# Patient Record
Sex: Male | Born: 1975
Health system: Southern US, Community
[De-identification: ages and names within clinical notes are randomized; demographics above are authoritative.]

## PROBLEM LIST (undated history)

## (undated) DIAGNOSIS — I5023 Acute on chronic systolic (congestive) heart failure: Secondary | ICD-10-CM

## (undated) DIAGNOSIS — R7989 Other specified abnormal findings of blood chemistry: Secondary | ICD-10-CM

## (undated) DIAGNOSIS — I428 Other cardiomyopathies: Secondary | ICD-10-CM

## (undated) DIAGNOSIS — I10 Essential (primary) hypertension: Secondary | ICD-10-CM

## (undated) DIAGNOSIS — E119 Type 2 diabetes mellitus without complications: Secondary | ICD-10-CM

## (undated) DIAGNOSIS — K529 Noninfective gastroenteritis and colitis, unspecified: Secondary | ICD-10-CM

## (undated) DIAGNOSIS — R945 Abnormal results of liver function studies: Secondary | ICD-10-CM

## (undated) DIAGNOSIS — F1290 Cannabis use, unspecified, uncomplicated: Secondary | ICD-10-CM

## (undated) DIAGNOSIS — I340 Nonrheumatic mitral (valve) insufficiency: Secondary | ICD-10-CM

## (undated) DIAGNOSIS — I5022 Chronic systolic (congestive) heart failure: Secondary | ICD-10-CM

## (undated) HISTORY — DX: Other cardiomyopathies: I42.8

## (undated) HISTORY — DX: Cannabis use, unspecified, uncomplicated: F12.90

## (undated) HISTORY — DX: Noninfective gastroenteritis and colitis, unspecified: K52.9

## (undated) HISTORY — DX: Essential (primary) hypertension: I10

## (undated) HISTORY — DX: Chronic systolic (congestive) heart failure: I50.22

## (undated) HISTORY — DX: Other specified abnormal findings of blood chemistry: R79.89

## (undated) HISTORY — DX: Abnormal results of liver function studies: R94.5

---

## 2004-02-07 ENCOUNTER — Emergency Department (HOSPITAL_COMMUNITY): Admission: EM | Admit: 2004-02-07 | Discharge: 2004-02-07 | Payer: Self-pay | Admitting: Family Medicine

## 2007-10-13 ENCOUNTER — Emergency Department (HOSPITAL_COMMUNITY): Admission: EM | Admit: 2007-10-13 | Discharge: 2007-10-13 | Payer: Self-pay | Admitting: Emergency Medicine

## 2008-06-20 ENCOUNTER — Emergency Department (HOSPITAL_COMMUNITY): Admission: EM | Admit: 2008-06-20 | Discharge: 2008-06-20 | Payer: Self-pay | Admitting: Emergency Medicine

## 2009-06-25 DIAGNOSIS — I4891 Unspecified atrial fibrillation: Secondary | ICD-10-CM

## 2009-06-25 HISTORY — DX: Unspecified atrial fibrillation: I48.91

## 2009-09-09 ENCOUNTER — Emergency Department (HOSPITAL_COMMUNITY): Admission: EM | Admit: 2009-09-09 | Discharge: 2009-09-09 | Payer: Self-pay | Admitting: Emergency Medicine

## 2010-09-17 LAB — URINALYSIS, ROUTINE W REFLEX MICROSCOPIC
Bilirubin Urine: NEGATIVE
Glucose, UA: NEGATIVE mg/dL
Hgb urine dipstick: NEGATIVE
Ketones, ur: NEGATIVE mg/dL
Protein, ur: NEGATIVE mg/dL
pH: 6 (ref 5.0–8.0)

## 2010-10-12 ENCOUNTER — Emergency Department (HOSPITAL_COMMUNITY)
Admission: EM | Admit: 2010-10-12 | Discharge: 2010-10-12 | Disposition: A | Discharge status: Home / Self Care | Payer: Self-pay | Attending: Emergency Medicine | Admitting: Emergency Medicine

## 2010-10-12 ENCOUNTER — Emergency Department (HOSPITAL_COMMUNITY): Payer: Self-pay

## 2010-10-12 DIAGNOSIS — R197 Diarrhea, unspecified: Secondary | ICD-10-CM | POA: Insufficient documentation

## 2010-10-12 DIAGNOSIS — R112 Nausea with vomiting, unspecified: Secondary | ICD-10-CM | POA: Insufficient documentation

## 2010-10-12 DIAGNOSIS — R109 Unspecified abdominal pain: Secondary | ICD-10-CM | POA: Insufficient documentation

## 2010-10-12 DIAGNOSIS — K529 Noninfective gastroenteritis and colitis, unspecified: Secondary | ICD-10-CM

## 2010-10-12 DIAGNOSIS — R141 Gas pain: Secondary | ICD-10-CM | POA: Insufficient documentation

## 2010-10-12 DIAGNOSIS — R142 Eructation: Secondary | ICD-10-CM | POA: Insufficient documentation

## 2010-10-12 DIAGNOSIS — R000 Tachycardia, unspecified: Secondary | ICD-10-CM | POA: Insufficient documentation

## 2010-10-12 DIAGNOSIS — K5289 Other specified noninfective gastroenteritis and colitis: Secondary | ICD-10-CM | POA: Insufficient documentation

## 2010-10-12 DIAGNOSIS — R143 Flatulence: Secondary | ICD-10-CM | POA: Insufficient documentation

## 2010-10-12 DIAGNOSIS — I10 Essential (primary) hypertension: Secondary | ICD-10-CM | POA: Insufficient documentation

## 2010-10-12 HISTORY — DX: Noninfective gastroenteritis and colitis, unspecified: K52.9

## 2010-10-12 LAB — COMPREHENSIVE METABOLIC PANEL
Alkaline Phosphatase: 42 U/L (ref 39–117)
BUN: 8 mg/dL (ref 6–23)
Creatinine, Ser: 1.21 mg/dL (ref 0.4–1.5)
Glucose, Bld: 117 mg/dL — ABNORMAL HIGH (ref 70–99)
Potassium: 4 mEq/L (ref 3.5–5.1)
Total Protein: 7.4 g/dL (ref 6.0–8.3)

## 2010-10-12 LAB — URINALYSIS, ROUTINE W REFLEX MICROSCOPIC
Ketones, ur: 15 mg/dL — AB
Nitrite: NEGATIVE
Protein, ur: NEGATIVE mg/dL
pH: 7.5 (ref 5.0–8.0)

## 2010-10-12 LAB — DIFFERENTIAL
Eosinophils Absolute: 0.1 10*3/uL (ref 0.0–0.7)
Eosinophils Relative: 1 % (ref 0–5)
Lymphocytes Relative: 30 % (ref 12–46)
Lymphs Abs: 2 10*3/uL (ref 0.7–4.0)
Monocytes Absolute: 0.6 10*3/uL (ref 0.1–1.0)
Monocytes Relative: 8 % (ref 3–12)

## 2010-10-12 LAB — CBC
HCT: 43.2 % (ref 39.0–52.0)
MCH: 29.2 pg (ref 26.0–34.0)
MCHC: 33.6 g/dL (ref 30.0–36.0)
MCV: 86.9 fL (ref 78.0–100.0)
Platelets: 213 10*3/uL (ref 150–400)
RDW: 13.2 % (ref 11.5–15.5)
WBC: 6.9 10*3/uL (ref 4.0–10.5)

## 2010-10-12 LAB — LIPASE, BLOOD: Lipase: 19 U/L (ref 11–59)

## 2010-10-12 MED ORDER — IOHEXOL 300 MG/ML  SOLN: 100.0000 mL | Freq: Once | INTRAMUSCULAR | Status: DC | PRN

## 2010-10-20 DIAGNOSIS — I428 Other cardiomyopathies: Secondary | ICD-10-CM

## 2010-10-20 HISTORY — DX: Other cardiomyopathies: I42.8

## 2010-10-31 ENCOUNTER — Emergency Department (HOSPITAL_COMMUNITY)
Admission: EM | Admit: 2010-10-31 | Discharge: 2010-11-01 | Disposition: A | Discharge status: Home / Self Care | Payer: Medicaid Other | Attending: Emergency Medicine | Admitting: Emergency Medicine

## 2010-10-31 DIAGNOSIS — R197 Diarrhea, unspecified: Secondary | ICD-10-CM | POA: Insufficient documentation

## 2010-10-31 DIAGNOSIS — R109 Unspecified abdominal pain: Secondary | ICD-10-CM | POA: Insufficient documentation

## 2010-10-31 DIAGNOSIS — R112 Nausea with vomiting, unspecified: Secondary | ICD-10-CM | POA: Insufficient documentation

## 2010-10-31 DIAGNOSIS — K5289 Other specified noninfective gastroenteritis and colitis: Secondary | ICD-10-CM | POA: Insufficient documentation

## 2010-11-01 ENCOUNTER — Emergency Department (HOSPITAL_COMMUNITY): Payer: Medicaid Other

## 2010-11-01 LAB — URINALYSIS, ROUTINE W REFLEX MICROSCOPIC
Bilirubin Urine: NEGATIVE
Glucose, UA: NEGATIVE mg/dL
Hgb urine dipstick: NEGATIVE
Ketones, ur: 15 mg/dL — AB
Protein, ur: NEGATIVE mg/dL

## 2010-11-01 LAB — DIFFERENTIAL
Basophils Absolute: 0 10*3/uL (ref 0.0–0.1)
Eosinophils Absolute: 0.1 10*3/uL (ref 0.0–0.7)
Eosinophils Relative: 2 % (ref 0–5)
Lymphocytes Relative: 40 % (ref 12–46)
Neutrophils Relative %: 48 % (ref 43–77)

## 2010-11-01 LAB — COMPREHENSIVE METABOLIC PANEL
ALT: 34 U/L (ref 0–53)
AST: 30 U/L (ref 0–37)
Albumin: 3.8 g/dL (ref 3.5–5.2)
Alkaline Phosphatase: 38 U/L — ABNORMAL LOW (ref 39–117)
Potassium: 3.7 mEq/L (ref 3.5–5.1)
Sodium: 139 mEq/L (ref 135–145)
Total Protein: 6.9 g/dL (ref 6.0–8.3)

## 2010-11-01 LAB — CBC
Platelets: 202 10*3/uL (ref 150–400)
RDW: 13.3 % (ref 11.5–15.5)
WBC: 6.8 10*3/uL (ref 4.0–10.5)

## 2010-11-01 MED ORDER — IOHEXOL 300 MG/ML  SOLN
100.0000 mL | Freq: Once | INTRAMUSCULAR | Status: AC | PRN
  Administered 2010-11-01: 100 mL via INTRAVENOUS

## 2010-11-09 ENCOUNTER — Telehealth: Payer: Self-pay | Admitting: Internal Medicine

## 2010-11-09 NOTE — Telephone Encounter (Signed)
Left message for pt to call back  °

## 2010-11-09 NOTE — Telephone Encounter (Signed)
Pt has been seen in the ER several times for colitis. Pt needs an appt with a GI doc and was instructed to call us. Appt made for pt to see Willette Cluster NP 11/16/10@8 :30am. Pt aware of appt date and time.

## 2010-11-16 ENCOUNTER — Emergency Department (HOSPITAL_COMMUNITY): Payer: Medicaid Other

## 2010-11-16 ENCOUNTER — Encounter (HOSPITAL_COMMUNITY): Payer: Self-pay | Admitting: Radiology

## 2010-11-16 ENCOUNTER — Emergency Department (HOSPITAL_COMMUNITY)
Admission: EM | Admit: 2010-11-16 | Discharge: 2010-11-16 | Disposition: A | Discharge status: Home / Self Care | Payer: Medicaid Other | Attending: Emergency Medicine | Admitting: Emergency Medicine

## 2010-11-16 ENCOUNTER — Encounter: Payer: Self-pay | Admitting: Nurse Practitioner

## 2010-11-16 ENCOUNTER — Ambulatory Visit (INDEPENDENT_AMBULATORY_CARE_PROVIDER_SITE_OTHER): Payer: Self-pay | Admitting: Nurse Practitioner

## 2010-11-16 DIAGNOSIS — R0609 Other forms of dyspnea: Secondary | ICD-10-CM | POA: Insufficient documentation

## 2010-11-16 DIAGNOSIS — R0602 Shortness of breath: Secondary | ICD-10-CM | POA: Insufficient documentation

## 2010-11-16 DIAGNOSIS — R112 Nausea with vomiting, unspecified: Secondary | ICD-10-CM

## 2010-11-16 DIAGNOSIS — R142 Eructation: Secondary | ICD-10-CM | POA: Insufficient documentation

## 2010-11-16 DIAGNOSIS — R109 Unspecified abdominal pain: Secondary | ICD-10-CM | POA: Insufficient documentation

## 2010-11-16 DIAGNOSIS — R197 Diarrhea, unspecified: Secondary | ICD-10-CM | POA: Insufficient documentation

## 2010-11-16 DIAGNOSIS — R0989 Other specified symptoms and signs involving the circulatory and respiratory systems: Secondary | ICD-10-CM | POA: Insufficient documentation

## 2010-11-16 DIAGNOSIS — R634 Abnormal weight loss: Secondary | ICD-10-CM

## 2010-11-16 DIAGNOSIS — R000 Tachycardia, unspecified: Secondary | ICD-10-CM

## 2010-11-16 DIAGNOSIS — R141 Gas pain: Secondary | ICD-10-CM | POA: Insufficient documentation

## 2010-11-16 DIAGNOSIS — R935 Abnormal findings on diagnostic imaging of other abdominal regions, including retroperitoneum: Secondary | ICD-10-CM

## 2010-11-16 DIAGNOSIS — K5289 Other specified noninfective gastroenteritis and colitis: Secondary | ICD-10-CM | POA: Insufficient documentation

## 2010-11-16 LAB — LIPASE, BLOOD: Lipase: 13 U/L (ref 11–59)

## 2010-11-16 LAB — CBC
MCH: 30.2 pg (ref 26.0–34.0)
MCV: 87 fL (ref 78.0–100.0)
Platelets: 199 10*3/uL (ref 150–400)
RBC: 4.84 MIL/uL (ref 4.22–5.81)

## 2010-11-16 LAB — COMPREHENSIVE METABOLIC PANEL
Alkaline Phosphatase: 39 U/L (ref 39–117)
BUN: 14 mg/dL (ref 6–23)
CO2: 23 mEq/L (ref 19–32)
Chloride: 101 mEq/L (ref 96–112)
GFR calc non Af Amer: >60 mL/min (ref >60)
Glucose, Bld: 186 mg/dL — ABNORMAL HIGH (ref 70–99)
Potassium: 4.2 mEq/L (ref 3.5–5.1)
Total Bilirubin: 0.9 mg/dL (ref 0.3–1.2)

## 2010-11-16 LAB — URINALYSIS, ROUTINE W REFLEX MICROSCOPIC
Bilirubin Urine: NEGATIVE
Glucose, UA: NEGATIVE mg/dL
Ketones, ur: NEGATIVE mg/dL
Nitrite: NEGATIVE
pH: 5.5 (ref 5.0–8.0)

## 2010-11-16 LAB — DIFFERENTIAL
Eosinophils Absolute: 0 10*3/uL (ref 0.0–0.7)
Lymphs Abs: 2.5 10*3/uL (ref 0.7–4.0)
Monocytes Relative: 9 % (ref 3–12)
Neutrophils Relative %: 61 % (ref 43–77)

## 2010-11-16 MED ORDER — PEG-KCL-NACL-NASULF-NA ASC-C 100 G PO SOLR: 1.0000 | Freq: Once | ORAL | Status: AC

## 2010-11-16 MED ORDER — IOHEXOL 300 MG/ML  SOLN
100.0000 mL | Freq: Once | INTRAMUSCULAR | Status: AC | PRN
  Administered 2010-11-16: 100 mL via INTRAVENOUS

## 2010-11-16 NOTE — Progress Notes (Signed)
11/16/2010 Tyrone Walker [id number] [birth year]   HISTORY OF PRESENT ILLNESS: Tyrone Walker is a 35 year old black male who presents for evaluation of a one-month history of abdominal pain, diarrhea, nausea and vomiting. In mid April the patient went to the emergency department with these symptoms and CBC, lipase and comprehensive metabolic profile were normal. CT scan of the abdomen and pelvis with contrast showed some mild edema of the right colon. Patient was given a seven-day supply of Cipro Flagyl and Percocet. In the ER  patient was hypertensive and tachycardic and was advised to call his primary care provider in the next one to 2 weeks for further evaluation. Blood pressure in the emergency department was 170/118.  Patient completed the antibiotics without any improvement in symptoms. He returned to the emergency department on May 8th for reevaluation.  Again, patient's labs were normal. Urinalysis was unremarkable. Repeat CT scan of the abdomen and pelvis with contrast revealed small bilateral pleural effusions, fatty liver and stable appearance of the mild right-sided colonic edema. In the emergency department the patient was given IV fluids. pain medications and antiemetics. He was discharged home with Percocet, Levsin and Prednisone 50 mg twice a day for 5 days. Patient has completed his prednisone and symptoms have improved but not considerably. He still has some residual abdominal pain, mainly right mid abdomen. His diarrhea is mainly nocturnal. Stools are nonbloody. No documented fevers. Patient has had a 20 pound weight loss since his symptoms began one month ago. Prior to one month ago the patient had no gastrointestinal problems whatsoever.  Patient mentions that he is short of breath and has been for about a month now. He gets winded after taking a short flight of stairs. Prior to a month ago patient was working out at [employer] and in good health. Shortness of breath is particularly bad at night,  patient unable to lay flat without waking up short of breath. He has no chest pain. At present his breathing is unlabored. He does cough up some mucus from time to time.    Past Medical History  Diagnosis Date  . Ulcerative colitis    History reviewed. No pertinent past surgical history.  reports that he has never smoked. He has never used smokeless tobacco. He reports that he does not drink alcohol or use illicit drugs. family history includes Colon polyps in an unspecified family member and Diabetes in his mother. No Known Allergies    Outpatient Encounter Prescriptions as of 11/16/2010  Medication Sig Dispense Refill  . oxyCODONE-acetaminophen (PERCOCET) 10-325 MG per tablet Take 1 tablet by mouth every 6 (six) hours as needed.        . hyoscyamine (LEVSIN, ANASPAZ) 0.125 MG tablet Take 0.125 mg by mouth every 6 (six) hours as needed.        . metroNIDAZOLE (FLAGYL) 500 MG tablet Take 500 mg by mouth 3 (three) times daily.        . peg 3350 powder (MOVIPREP) 100 G SOLR Take 1 kit (100 g total) by mouth once.  1 kit  0  . predniSONE (DELTASONE) 50 MG tablet Take 50 mg by mouth 2 (two) times daily.           REVIEW OF SYSTEMS  : All other systems reviewed and negative except where noted in the History of Present Illness.   PHYSICAL EXAM: General: Well developed black male in no acute distress Head: Normocephalic and atraumatic Eyes:  sclerae anicteric,conjunctive pink. Ears: Normal auditory acuity Mouth: No  deformity or lesions Neck: Supple, no masses.  Lungs: Clear throughout to auscultation Heart:Apical pulse around 120 ; radial around 60 Abdomen: Soft, non distended, nontender. No masses or hepatomegaly noted. Normal Bowel sounds Rectal: Soft light brown-colored heme-negative stool. Musculoskeletal: Symmetrical with no gross deformities  Skin: No lesions on visible extremities Extremities: No edema or deformities noted Neurological: Alert oriented x 4, grossly  nonfocal Cervical Nodes:  No significant cervical adenopathy Psychological:  Alert and cooperative. Normal mood and affect  ASSESSMENT AND PLAN;

## 2010-11-16 NOTE — Patient Instructions (Signed)
We scheduled you for an appointment with Dr. Charlton Haws at [facility] [address]. [place] building. We scheduled you for a Colonoscopy with Dr. Melvia Heaps. Directions and brochure provided. We faxed the prescription for the colonoscopy prep to [facility].

## 2010-11-17 ENCOUNTER — Encounter: Payer: Self-pay | Admitting: Nurse Practitioner

## 2010-11-17 DIAGNOSIS — R0602 Shortness of breath: Secondary | ICD-10-CM | POA: Insufficient documentation

## 2010-11-17 DIAGNOSIS — R112 Nausea with vomiting, unspecified: Secondary | ICD-10-CM | POA: Insufficient documentation

## 2010-11-17 DIAGNOSIS — R634 Abnormal weight loss: Secondary | ICD-10-CM | POA: Insufficient documentation

## 2010-11-17 DIAGNOSIS — R197 Diarrhea, unspecified: Secondary | ICD-10-CM | POA: Insufficient documentation

## 2010-11-17 DIAGNOSIS — R000 Tachycardia, unspecified: Secondary | ICD-10-CM | POA: Insufficient documentation

## 2010-11-17 DIAGNOSIS — R935 Abnormal findings on diagnostic imaging of other abdominal regions, including retroperitoneum: Secondary | ICD-10-CM | POA: Insufficient documentation

## 2010-11-17 NOTE — Assessment & Plan Note (Addendum)
EKG read by Dr. Arlyce Dice - sinus tachycardia (HR 116) with fusion complexes, biatrial enlargement.

## 2010-11-17 NOTE — Assessment & Plan Note (Signed)
20 pound weight loss since GI symptoms began a month ago.

## 2010-11-17 NOTE — Assessment & Plan Note (Addendum)
Over one month history of nausea, vomiting, nonbloody diarrhea and abdominal pain, predominately right sided. Symptoms refractory to course of antbiotics. Moderate improvement after short course of steroids. CTscan reveals mild right colon edema. Rule out infectious colitis, IBD. For further evaluation the patient will be scheduled for colonoscopy. The risks, benefits, and alternatives to colonoscopy with possible biopsy and possible polypectomy were discussed with the patient and he consents to proceed. Cardiac evaluation prior to procedure.

## 2010-11-18 ENCOUNTER — Inpatient Hospital Stay (HOSPITAL_COMMUNITY)
Admission: EM | Admit: 2010-11-18 | Discharge: 2010-11-26 | DRG: 287 | Disposition: A | Discharge status: Home Health | Payer: Medicaid Other | Attending: Family Medicine | Admitting: Family Medicine

## 2010-11-18 ENCOUNTER — Encounter: Payer: Self-pay | Admitting: Family Medicine

## 2010-11-18 ENCOUNTER — Emergency Department (HOSPITAL_COMMUNITY): Payer: Medicaid Other

## 2010-11-18 DIAGNOSIS — I5023 Acute on chronic systolic (congestive) heart failure: Principal | ICD-10-CM | POA: Diagnosis present

## 2010-11-18 DIAGNOSIS — I428 Other cardiomyopathies: Secondary | ICD-10-CM | POA: Diagnosis present

## 2010-11-18 DIAGNOSIS — I129 Hypertensive chronic kidney disease with stage 1 through stage 4 chronic kidney disease, or unspecified chronic kidney disease: Secondary | ICD-10-CM | POA: Diagnosis present

## 2010-11-18 DIAGNOSIS — E876 Hypokalemia: Secondary | ICD-10-CM | POA: Diagnosis present

## 2010-11-18 DIAGNOSIS — R197 Diarrhea, unspecified: Secondary | ICD-10-CM | POA: Diagnosis present

## 2010-11-18 DIAGNOSIS — I509 Heart failure, unspecified: Secondary | ICD-10-CM | POA: Diagnosis present

## 2010-11-18 DIAGNOSIS — R7989 Other specified abnormal findings of blood chemistry: Secondary | ICD-10-CM | POA: Diagnosis present

## 2010-11-18 DIAGNOSIS — I279 Pulmonary heart disease, unspecified: Secondary | ICD-10-CM | POA: Diagnosis present

## 2010-11-18 DIAGNOSIS — N189 Chronic kidney disease, unspecified: Secondary | ICD-10-CM | POA: Diagnosis present

## 2010-11-18 LAB — DIFFERENTIAL
Basophils Absolute: 0 10*3/uL (ref 0.0–0.1)
Basophils Relative: 0 % (ref 0–1)
Eosinophils Relative: 1 % (ref 0–5)
Monocytes Absolute: 0.8 10*3/uL (ref 0.1–1.0)
Monocytes Relative: 11 % (ref 3–12)

## 2010-11-18 LAB — CBC
HCT: 41.3 % (ref 39.0–52.0)
MCH: 29.9 pg (ref 26.0–34.0)
MCHC: 34.4 g/dL (ref 30.0–36.0)
RDW: 13.6 % (ref 11.5–15.5)

## 2010-11-18 LAB — COMPREHENSIVE METABOLIC PANEL
AST: 51 U/L — ABNORMAL HIGH (ref 0–37)
Albumin: 3.4 g/dL — ABNORMAL LOW (ref 3.5–5.2)
Alkaline Phosphatase: 36 U/L — ABNORMAL LOW (ref 39–117)
CO2: 22 mEq/L (ref 19–32)
Chloride: 102 mEq/L (ref 96–112)
Creatinine, Ser: 1.1 mg/dL (ref 0.4–1.5)
GFR calc Af Amer: >60 mL/min (ref >60)
GFR calc non Af Amer: >60 mL/min (ref >60)
Potassium: 3.9 mEq/L (ref 3.5–5.1)
Total Bilirubin: 0.6 mg/dL (ref 0.3–1.2)

## 2010-11-18 LAB — CK TOTAL AND CKMB (NOT AT ARMC)
CK, MB: 3.3 ng/mL (ref 0.3–4.0)
Total CK: 126 U/L (ref 7–232)

## 2010-11-18 NOTE — Assessment & Plan Note (Signed)
One month history of SOB with minimal exertion. Chest x-ray 10/12/2010 shows mild congestive changes and heart along with slight interstitial edema. Lungs are clear on examination. Will arrange for cardiac evaluation.

## 2010-11-18 NOTE — Progress Notes (Unsigned)
Family Medicine Teaching [facility] Admission History and Physical  Patient name: Tyrone Walker Medical record number: [id number] Date of birth: [birth year] Age: 35 y.o. Gender: male  Primary Care Provider: [name], MD  Chief Complaint: *** History of Present Illness: Toshiro Walker is a 35 y.o. year old male presenting with ***  Patient Active Problem List  Diagnoses  . Nonspecific (abnormal) findings on radiological and other examination of abdominal area, including retroperitoneum  . Diarrhea  . Nausea with vomiting  . Loss of weight  . Shortness of breath  . Tachycardia   Past Medical History: No past medical history on file.  Past Surgical History: No past surgical history on file.  Social History: History   Social History  . Marital Status: Single    Spouse Name: N/A    Number of Children: 2  . Years of Education: N/A   Social History Main Topics  . Smoking status: Never Smoker   . Smokeless tobacco: Never Used  . Alcohol Use: No  . Drug Use: No  . Sexually Active: Not on file   Other Topics Concern  . Not on file   Social History Narrative  . No narrative on file    Family History: Family History  Problem Relation Age of Onset  . Diabetes Mother     Grandmother  . Colon polyps      Grandfather    Allergies: No Known Allergies  Current Outpatient Prescriptions  Medication Sig Dispense Refill  . hyoscyamine (LEVSIN, ANASPAZ) 0.125 MG tablet Take 0.125 mg by mouth every 6 (six) hours as needed.        . metroNIDAZOLE (FLAGYL) 500 MG tablet Take 500 mg by mouth 3 (three) times daily.        Marland Kitchen oxyCODONE-acetaminophen (PERCOCET) 10-325 MG per tablet Take 1 tablet by mouth every 6 (six) hours as needed.        . predniSONE (DELTASONE) 50 MG tablet Take 50 mg by mouth 2 (two) times daily.         Review Of Systems: Per HPI with the following additions: *** Otherwise 12 point review of systems was performed and was  unremarkable.  Physical Exam: Pulse: ***  Blood Pressure: ***/*** RR: ***   O2: *** on *** Temp: ***  General: {Exam; general:16600} HEENT: {exam; heent:31974} Heart: {EXAM; BJYNW:29562} Lungs: {pe lungs:310571::"clear to auscultation","no wheezes or rales","unlabored breathing"} Abdomen: {Exam; abdomen:5259::"abdomen is soft without significant tenderness, masses, organomegaly or guarding"} Extremities: {Exam; extremity:5109} Skin:{Exam; ZHYQ:65784} Neurology: {exam; neuro:5902::"normal without focal findings","mental status, speech normal, alert and oriented x3","PERLA","reflexes normal and symmetric"}  Labs and Imaging: Lab Results  Component Value Date/Time   NA 137 11/18/2010  8:40 PM   K 3.9 11/18/2010  8:40 PM   CL 102 11/18/2010  8:40 PM   CO2 22 11/18/2010  8:40 PM   BUN 14 11/18/2010  8:40 PM   CREATININE 1.10 11/18/2010  8:40 PM   GLUCOSE 100* 11/18/2010  8:40 PM   Lab Results  Component Value Date   WBC 7.1 11/18/2010   HGB 14.2 11/18/2010   HCT 41.3 11/18/2010   MCV 86.9 11/18/2010   PLT 186 11/18/2010   ***   Assessment and Plan: Tyrone Walker is a 35 y.o. year old male presenting with *** 1. *** 2. FEN/GI: *** 3. Prophylaxis: *** 4. Disposition: ***

## 2010-11-19 ENCOUNTER — Inpatient Hospital Stay (HOSPITAL_COMMUNITY): Payer: Medicaid Other

## 2010-11-19 DIAGNOSIS — I5023 Acute on chronic systolic (congestive) heart failure: Secondary | ICD-10-CM

## 2010-11-19 DIAGNOSIS — R0602 Shortness of breath: Secondary | ICD-10-CM

## 2010-11-19 DIAGNOSIS — K219 Gastro-esophageal reflux disease without esophagitis: Secondary | ICD-10-CM

## 2010-11-19 LAB — CBC
MCH: 29.3 pg (ref 26.0–34.0)
MCHC: 33.3 g/dL (ref 30.0–36.0)
Platelets: 177 10*3/uL (ref 150–400)
RDW: 13.8 % (ref 11.5–15.5)

## 2010-11-19 LAB — RAPID URINE DRUG SCREEN, HOSP PERFORMED
Amphetamines: NOT DETECTED
Barbiturates: NOT DETECTED
Benzodiazepines: NOT DETECTED
Cocaine: NOT DETECTED
Opiates: NOT DETECTED
Tetrahydrocannabinol: POSITIVE — AB

## 2010-11-19 LAB — COMPREHENSIVE METABOLIC PANEL
AST: 44 U/L — ABNORMAL HIGH (ref 0–37)
CO2: 30 mEq/L (ref 19–32)
Calcium: 8.7 mg/dL (ref 8.4–10.5)
Chloride: 100 mEq/L (ref 96–112)
Creatinine, Ser: 1.34 mg/dL (ref 0.4–1.5)
GFR calc Af Amer: >60 mL/min (ref >60)
GFR calc non Af Amer: 60 mL/min — ABNORMAL LOW (ref >60)
Glucose, Bld: 88 mg/dL (ref 70–99)
Total Bilirubin: 1.1 mg/dL (ref 0.3–1.2)

## 2010-11-19 LAB — CARDIAC PANEL(CRET KIN+CKTOT+MB+TROPI)
Relative Index: INVALID (ref 0.0–2.5)
Troponin I: <0.3 ng/mL (ref <0.30)

## 2010-11-19 LAB — TSH: TSH: 0.955 u[IU]/mL (ref 0.350–4.500)

## 2010-11-19 LAB — CK TOTAL AND CKMB (NOT AT ARMC): CK, MB: 3.3 ng/mL (ref 0.3–4.0)

## 2010-11-20 DIAGNOSIS — I509 Heart failure, unspecified: Secondary | ICD-10-CM

## 2010-11-20 LAB — DIFFERENTIAL
Basophils Absolute: 0 10*3/uL (ref 0.0–0.1)
Basophils Relative: 0 % (ref 0–1)
Eosinophils Relative: 2 % (ref 0–5)
Lymphocytes Relative: 35 % (ref 12–46)
Monocytes Absolute: 0.9 10*3/uL (ref 0.1–1.0)
Neutro Abs: 3.5 10*3/uL (ref 1.7–7.7)

## 2010-11-20 LAB — CBC
Hemoglobin: 14.1 g/dL (ref 13.0–17.0)
MCHC: 33.7 g/dL (ref 30.0–36.0)
RDW: 13.7 % (ref 11.5–15.5)
WBC: 7 10*3/uL (ref 4.0–10.5)

## 2010-11-20 LAB — BASIC METABOLIC PANEL
Calcium: 8.6 mg/dL (ref 8.4–10.5)
GFR calc Af Amer: >60 mL/min (ref >60)
GFR calc non Af Amer: 55 mL/min — ABNORMAL LOW (ref >60)
Potassium: 3.4 mEq/L — ABNORMAL LOW (ref 3.5–5.1)
Sodium: 139 mEq/L (ref 135–145)

## 2010-11-20 LAB — LIPID PANEL
Cholesterol: 117 mg/dL (ref 0–200)
HDL: 28 mg/dL — ABNORMAL LOW (ref >39)
Triglycerides: 224 mg/dL — ABNORMAL HIGH (ref <150)

## 2010-11-20 LAB — IRON AND TIBC
Saturation Ratios: 12 % — ABNORMAL LOW (ref 20–55)
TIBC: 286 ug/dL (ref 215–435)

## 2010-11-20 LAB — TSH: TSH: 1.271 u[IU]/mL (ref 0.350–4.500)

## 2010-11-20 LAB — T4, FREE: Free T4: 1.26 ng/dL (ref 0.80–1.80)

## 2010-11-21 DIAGNOSIS — I428 Other cardiomyopathies: Secondary | ICD-10-CM

## 2010-11-21 HISTORY — PX: OTHER SURGICAL HISTORY: SHX169

## 2010-11-21 LAB — CBC
HCT: 41.6 % (ref 39.0–52.0)
MCH: 29.1 pg (ref 26.0–34.0)
MCV: 86.5 fL (ref 78.0–100.0)
Platelets: 191 10*3/uL (ref 150–400)
RBC: 4.81 MIL/uL (ref 4.22–5.81)
RDW: 13.7 % (ref 11.5–15.5)

## 2010-11-21 LAB — COMPREHENSIVE METABOLIC PANEL
AST: 21 U/L (ref 0–37)
Alkaline Phosphatase: 44 U/L (ref 39–117)
BUN: 19 mg/dL (ref 6–23)
CO2: 25 mEq/L (ref 19–32)
Chloride: 104 mEq/L (ref 96–112)
Creatinine, Ser: 1.31 mg/dL (ref 0.4–1.5)
GFR calc Af Amer: >60 mL/min (ref >60)
GFR calc non Af Amer: >60 mL/min (ref >60)
Potassium: 4.2 mEq/L (ref 3.5–5.1)
Sodium: 139 mEq/L (ref 135–145)
Total Bilirubin: 0.3 mg/dL (ref 0.3–1.2)

## 2010-11-21 LAB — POCT I-STAT 3, VENOUS BLOOD GAS (G3P V)
Bicarbonate: 26.6 mEq/L — ABNORMAL HIGH (ref 20.0–24.0)
O2 Saturation: 51 %
TCO2: 28 mmol/L (ref 0–100)
pCO2, Ven: 43.9 mmHg — ABNORMAL LOW (ref 45.0–50.0)
pH, Ven: 7.39 — ABNORMAL HIGH (ref 7.250–7.300)
pO2, Ven: 28 mmHg — CL (ref 30.0–45.0)

## 2010-11-21 LAB — POCT I-STAT 3, ART BLOOD GAS (G3+)
Acid-base deficit: 1 mmol/L (ref 0.0–2.0)
Bicarbonate: 22.7 mEq/L (ref 20.0–24.0)
O2 Saturation: 95 %
TCO2: 24 mmol/L (ref 0–100)
pO2, Arterial: 70 mmHg — ABNORMAL LOW (ref 80.0–100.0)

## 2010-11-21 LAB — PROTIME-INR: INR: 1.2 (ref 0.00–1.49)

## 2010-11-22 ENCOUNTER — Ambulatory Visit: Payer: Self-pay | Admitting: Physician Assistant

## 2010-11-22 ENCOUNTER — Encounter: Payer: Self-pay | Admitting: Cardiovascular Disease

## 2010-11-22 LAB — BASIC METABOLIC PANEL
BUN: 19 mg/dL (ref 6–23)
CO2: 29 mEq/L (ref 19–32)
Calcium: 8.5 mg/dL (ref 8.4–10.5)
GFR calc non Af Amer: 56 mL/min — ABNORMAL LOW (ref >60)
Glucose, Bld: 102 mg/dL — ABNORMAL HIGH (ref 70–99)

## 2010-11-22 LAB — POCT I-STAT 3, ART BLOOD GAS (G3+)
Acid-base deficit: 1 mmol/L (ref 0.0–2.0)
O2 Saturation: 86 %
pO2, Arterial: 54 mmHg — ABNORMAL LOW (ref 80.0–100.0)

## 2010-11-22 NOTE — Progress Notes (Signed)
Check official reading of EKG.  I thought it was a bigeminy rhythm

## 2010-11-23 LAB — BASIC METABOLIC PANEL
CO2: 30 mEq/L (ref 19–32)
Calcium: 8.7 mg/dL (ref 8.4–10.5)
Chloride: 100 mEq/L (ref 96–112)
Chloride: 102 mEq/L (ref 96–112)
Creatinine, Ser: 1.38 mg/dL (ref 0.4–1.5)
GFR calc Af Amer: >60 mL/min (ref >60)
GFR calc non Af Amer: >60 mL/min (ref >60)
Glucose, Bld: 122 mg/dL — ABNORMAL HIGH (ref 70–99)
Potassium: 4.2 mEq/L (ref 3.5–5.1)
Sodium: 139 mEq/L (ref 135–145)
Sodium: 137 mEq/L (ref 135–145)

## 2010-11-23 LAB — CARBOXYHEMOGLOBIN
O2 Saturation: 52.4 %
Total hemoglobin: 13.8 g/dL (ref 13.5–18.0)

## 2010-11-23 LAB — ROCKY MTN SPOTTED FVR AB, IGM-BLOOD: RMSF IgM: 0.07 IV (ref 0.00–0.89)

## 2010-11-23 LAB — ROCKY MTN SPOTTED FVR AB, IGG-BLOOD: RMSF IgG: 0.07 IV

## 2010-11-23 NOTE — Progress Notes (Signed)
Reviewed and agree with management. Icarus Partch D. Lillias Difrancesco, M.D., FACG  

## 2010-11-23 NOTE — Consult Note (Signed)
[name], [name] NO.:  [id number]  MEDICAL RECORD NO.:  [id number]           PATIENT TYPE:  E  LOCATION:  [facility]                         FACILITY:  [facility]  PHYSICIAN:  Cassell Clement, M.D. DATE OF BIRTH:  17-Dec-1975  DATE OF CONSULTATION:  11/19/2010 DATE OF DISCHARGE:                                CONSULTATION   CARDIOLOGIST:  None.  PRIMARY MD:  [facility]  CHIEF COMPLAINT:  Shortness of breath.  HISTORY:  This pleasant 35 year old African American gentleman is seen in consultation at the request of the [facility] because of shortness of breath.  He gives a history of having had increased dyspnea with exertion for the past 2 months.  He reports that he went to the emergency room about a month ago for abdominal pain on October 12, 2010.  At that time, he was noted on radiology studies to have cardiac enlargement with prominent pulmonary vascularity and interstitial changes suggesting congestion heart failure, fluid overload with interstitial edema.  He was also told at that time of his emergency room visit that he had colitis.  This was based on some mild edema in the right colon felt possibly due to inflammatory bowel disease. Arrangements were made for him to follow-up with [facility].  The thought was that he would have a colonoscopy.  However, colonoscopy plans were put on hold when the GI Service noted the question about cardiomegaly and congestive heart failure.  He was waiting to see a cardiologist about this and an appointment was pending.  However, last evening, his dyspnea became worse and he came to the emergency room. The patient has had marked shortness of breath with minimal activity. He also gets chest discomfort when he walks and when he leans forward. The patient also gives a history of having lost 30 pounds since October 12, 2010.  He states that he has evening fevers to 101 each night and has had night  sweats.  He has also been experiencing palpitations. There is no history of known diabetes or hypertension and his cholesterol status is unknown.  The patient underwent echocardiogram earlier today which shows a dilated cardiomyopathy with ejection fraction of 20% and pulmonary hypertension with a peak PA pressure of 48.  His left atrium is mildly dilated and there was mild mitral regurgitation by echo.  MEDICATIONS:  The patient was on no medications at home.  ALLERGIES:  He denies any allergies.  SOCIAL HISTORY:  He lives with his girlfriend.  They have a newborn baby born Oct 24, 2010.  Social history also reveals that he is presently between jobs.  He is a trained Runner, broadcasting/film/video by occupation.  The patient has never smoked cigarettes.  He does not drink any alcohol.  He has never used cocaine.  He smokes occasional marijuana and his urine screen was positive for marijuana on this admission.  FAMILY HISTORY:  His mother is living but has a history of blocked coronary arteries and heart failure and his father is living at age 44 but has diabetes.  He has several siblings all of whom are healthy.  REVIEW OF SYSTEMS:  Positive for fever, chills and night sweats. Positive for peripheral edema and for presyncopal symptoms.  Negative for genitourinary symptoms.  Positive for explosive diarrhea which has actually gotten better in the past week.  No history of diabetes, thyroid disease or known hypertension.  All other systems reviewed and are negative.  PHYSICAL EXAMINATION:  VITAL SIGNS:  His blood pressure is 150/96, pulse is 102 and regular, temperature 97. GENERAL APPEARANCE:  A well-developed, well-nourished gentleman in no distress. HEAD AND NECK:  Unremarkable.  The jugular venous pressure normal. Carotids normal.  Thyroid normal. CHEST:  Clear to percussion and auscultation. HEART:  Loud S3 gallop.  There is no pericardial friction rub. ABDOMEN:  Generally tender without  palpable masses. EXTREMITIES:  No phlebitis or edema.  Pedal pulses are present. MUSCULOSKELETAL:  Unremarkable. NEUROLOGIC:  Normal. INTEGUMENT:  No skin rash.  IMAGING:  Chest x-ray shows an enlarged heart and persistent evidence of probable interstitial edema and pulmonary venous hypertension.  EKG shows sinus tachycardia, biatrial enlargement and no acute ischemic changes.  LABORATORY STUDIES:  Cardiac enzymes are negative.  His B-natriuretic peptide on admission was elevated at 1316.  His BUN is 12, creatinine 1.34.  His SGOT is elevated at 44, SGPT elevated at 93.  CBC shows normal white count of 7000, hemoglobin of 13.7, hematocrit 41.  IMPRESSION: 1. Dilated cardiomyopathy of uncertain etiology.  We will need to rule     out several potential causes.  We would consider a possible post     viral or possibly sarcoidosis.  He could also have a dilated     cardiomyopathy secondary to previously unrecognized longstanding     hypertension.  The patient denies any alcohol or cocaine use. 2. Elevated liver function tests probably secondary to congestive     heart failure and passive hepatic congestion. 3. History of fever/night sweats/weight loss for the past several     months, uncertain etiology, rule out sarcoidosis, rule out other     occult processes.  RECOMMENDATIONS:  We will treat his dilated cardiomyopathy by starting him on carvedilol and an ACE inhibitor.  We will use DVT prophylaxis with Lovenox.  We will add aspirin.  We will check lipid panel, serum iron and TIBC, serum ferritin, serum TSH and free T4 and a serum ACE level.  We will plan for right and left heart cardiac catheterization on Tuesday morning, Nov 21, 2010, when the cath lab reopens.  Many thanks for the opportunity to see this pleasant gentleman with you.          ______________________________ Cassell Clement, M.D.     TB/MEDQ  D:  11/19/2010  T:  11/19/2010  Job:  [id number]  Electronically  Signed by Cassell Clement M.D. on 11/23/2010 12:48:54 PM

## 2010-11-24 LAB — CARBOXYHEMOGLOBIN
Carboxyhemoglobin: 1.1 % (ref 0.5–1.5)
Methemoglobin: 0.5 % (ref 0.0–1.5)

## 2010-11-24 LAB — COMPREHENSIVE METABOLIC PANEL
Albumin: 3.1 g/dL — ABNORMAL LOW (ref 3.5–5.2)
Alkaline Phosphatase: 49 U/L (ref 39–117)
BUN: 15 mg/dL (ref 6–23)
Potassium: 3.9 mEq/L (ref 3.5–5.1)
Total Protein: 5.9 g/dL — ABNORMAL LOW (ref 6.0–8.3)

## 2010-11-24 LAB — CMV ANTIBODY, IGG (EIA): CMV Ab - IgG: 0.15 (ref <0.90)

## 2010-11-24 LAB — ANA: Anti Nuclear Antibody(ANA): NEGATIVE

## 2010-11-24 LAB — STOOL CULTURE

## 2010-11-24 LAB — HIV 1/2 CONFIRMATION: HIV-2 Ab: NEGATIVE

## 2010-11-25 LAB — COXSACKIE A VIRUS ANTIBODIES

## 2010-11-25 LAB — BASIC METABOLIC PANEL
BUN: 14 mg/dL (ref 6–23)
Creatinine, Ser: 1.1 mg/dL (ref 0.4–1.5)
GFR calc non Af Amer: >60 mL/min (ref >60)

## 2010-11-25 LAB — CARBOXYHEMOGLOBIN
O2 Saturation: 70.4 %
Total hemoglobin: 13.9 g/dL (ref 13.5–18.0)

## 2010-11-25 LAB — CBC
MCH: 29.4 pg (ref 26.0–34.0)
MCHC: 33.7 g/dL (ref 30.0–36.0)
MCV: 87.1 fL (ref 78.0–100.0)
Platelets: 188 10*3/uL (ref 150–400)
RDW: 13.4 % (ref 11.5–15.5)

## 2010-11-25 LAB — DIGOXIN LEVEL: Digoxin Level: 0.4 ng/mL — ABNORMAL LOW (ref 0.8–2.0)

## 2010-11-26 LAB — CBC
MCHC: 34.2 g/dL (ref 30.0–36.0)
MCV: 87.2 fL (ref 78.0–100.0)
Platelets: 219 10*3/uL (ref 150–400)
RDW: 13.5 % (ref 11.5–15.5)
WBC: 5.4 10*3/uL (ref 4.0–10.5)

## 2010-11-26 LAB — DIFFERENTIAL
Basophils Absolute: 0 10*3/uL (ref 0.0–0.1)
Eosinophils Absolute: 0.3 10*3/uL (ref 0.0–0.7)
Eosinophils Relative: 5 % (ref 0–5)
Lymphs Abs: 2.1 10*3/uL (ref 0.7–4.0)
Monocytes Absolute: 0.7 10*3/uL (ref 0.1–1.0)

## 2010-11-26 LAB — CARBOXYHEMOGLOBIN
Carboxyhemoglobin: 1 % (ref 0.5–1.5)
Methemoglobin: 0.6 % (ref 0.0–1.5)
Total hemoglobin: 14.4 g/dL (ref 13.5–18.0)

## 2010-11-26 LAB — BASIC METABOLIC PANEL
BUN: 16 mg/dL (ref 6–23)
Calcium: 9.1 mg/dL (ref 8.4–10.5)
GFR calc non Af Amer: >60 mL/min (ref >60)
Potassium: 4.1 mEq/L (ref 3.5–5.1)

## 2010-11-26 LAB — MAGNESIUM: Magnesium: 2.3 mg/dL (ref 1.5–2.5)

## 2010-11-27 ENCOUNTER — Encounter: Payer: Self-pay | Admitting: Gastroenterology

## 2010-11-27 ENCOUNTER — Institutional Professional Consult (permissible substitution): Payer: Self-pay | Admitting: Cardiovascular Disease

## 2010-11-28 ENCOUNTER — Other Ambulatory Visit: Payer: Self-pay | Admitting: Gastroenterology

## 2010-11-29 NOTE — Cardiovascular Report (Signed)
  NAMESIE, FORMISANO NO.:  [id number]  MEDICAL RECORD NO.:  [id number]           PATIENT TYPE:  I  LOCATION:  2925                         FACILITY:  [facility]  PHYSICIAN:  Lior Cartelli M. [place], M.D.  DATE OF BIRTH:  11-Jan-1976  DATE OF PROCEDURE:  11/21/2010 DATE OF DISCHARGE:                           CARDIAC CATHETERIZATION   INDICATIONS FOR PROCEDURE:  A 35 year old African American male who presents with new onset of dilated cardiomyopathy.  Etiology is unknown.  PROCEDURES:  Right and left heart catheterization, coronary and left ventricular angiography.  ACCESS:  Via the right femoral artery and vein using standard Seldinger technique.  EQUIPMENT:  5-French 4 cm right and left Judkins catheter, 5-French pigtail catheter, 5-French arterial sheath, 7-French venous sheath and 7- French balloon-tip Swan-Ganz catheter.  MEDICATIONS:  Local anesthesia 1% Xylocaine, Versed 2 mg IV.  CONTRAST:  Omnipaque 80 mL.HEMODYNAMIC DATA:  Right atrial pressure is 18/16 with a mean of 15 mmHg.  Right ventricular pressure is 47 with an EDP of 15 mmHg. Pulmonary artery pressure is 48/30 with a mean of 38 mmHg.  Pulmonary capillary wedge pressure is 35/37 with a mean of 33 mmHg.  Aortic pressure is 99/77 with a mean of 86 mmHg.  Left ventricular pressure is 95 with an EDP of 30 mmHg.  Cardiac output is 3.64 L/min by Fick with an index of 1.59.  The aortic saturation is 95% with pulmonary artery saturation of 51%.  ANGIOGRAPHIC DATA:  Left ventricular angiography performed in the RAO view demonstrates marked left ventricular enlargement with severe global hypokinesis and ejection fraction of 15-20%.  The left coronary artery arises and distributes in a dominant fashion. The left main coronary artery is normal.  The left anterior descending artery is normal.  The left circumflex coronary artery is dominant and is normal.  The right coronary artery is relatively small and  is normal.  FINAL INTERPRETATION: 1. Normal coronary anatomy. 2. Severe left ventricular dysfunction with a low cardiac output. 3. Moderate pulmonary hypertension with elevated left ventricular     filling pressures.  PLAN:  I think we need to pursue more aggressive inotropic therapy since the patient is decompensated.          ______________________________ Suesan Mohrmann M. [place], M.D.     PMJ/MEDQ  D:  11/21/2010  T:  11/22/2010  Job:  [id number]  cc:   Deniece Portela A. Sheffield Slider, M.D. Cassell Clement, M.D.  Electronically Signed by Keath Matera [place] M.D. on 11/29/2010 06:07:16 PM

## 2010-11-30 ENCOUNTER — Encounter: Payer: Self-pay | Admitting: Physician Assistant

## 2010-11-30 ENCOUNTER — Ambulatory Visit (INDEPENDENT_AMBULATORY_CARE_PROVIDER_SITE_OTHER): Payer: Medicaid Other | Admitting: Physician Assistant

## 2010-11-30 VITALS — BP 115/65 | HR 101 | Resp 16 | Ht 73.0 in | Wt 231.0 lb

## 2010-11-30 DIAGNOSIS — I428 Other cardiomyopathies: Secondary | ICD-10-CM

## 2010-11-30 DIAGNOSIS — I5022 Chronic systolic (congestive) heart failure: Secondary | ICD-10-CM | POA: Insufficient documentation

## 2010-11-30 LAB — BASIC METABOLIC PANEL
BUN: 18 mg/dL (ref 6–23)
CO2: 26 mEq/L (ref 19–32)
Chloride: 96 mEq/L (ref 96–112)
Glucose, Bld: 222 mg/dL — ABNORMAL HIGH (ref 70–99)
Potassium: 4 mEq/L (ref 3.5–5.1)

## 2010-11-30 NOTE — Assessment & Plan Note (Signed)
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  Will plan on relook echo in several months to reassess LVF.  If EF not recovered, refer to EP for consideration of ICD.

## 2010-11-30 NOTE — Assessment & Plan Note (Addendum)
Doing well.  Volume is stable.  Good medical regimen.  Getting lightheaded.  Will decrease Lasix to QOD.  Check BMET and digoxin level today.  Follow up with me in 2 weeks and Dr. Gala Romney in 4-6 weeks.  If feels better at follow up, increase carvedilol for better heart rate.

## 2010-11-30 NOTE — Patient Instructions (Signed)
Your physician recommends that you schedule a follow-up appointment in: 12/18/10 @ 8:30 AM TO SEE SCOTT WEAVER, PA-C SAME DAY DR. Leory Plowman IS IN THE OFFICE  Your physician recommends that you schedule a follow-up appointment in: 4-6 WEEKS WITH DR. Gala Romney AS PER SCOTT WEAVER, PA-C.  Your physician recommends that you return for lab work in: TODAY DIGOXIN LEVEL , BMET 428.22.  Your physician has recommended you make the following change in your medication: START TAKING YOUR LASIX EVERY OTHER DAY AS PER SCOTT WEAVER, PA-C.

## 2010-11-30 NOTE — Progress Notes (Signed)
History of Present Illness: Primary Cardiologist:  Dr. Arvilla Meres  Tyrone Walker is a 35 y.o. male who was admitted 5/26-6/3 with new onset systolic CHF.  He ruled out for MI.  Echo demonstrated EF 20%.  Cath demonstrated normal cors and elevated filling pressures.  He was diureses and started on IV Milrinone for low output.  He had serology done to evaluate etiology of his myopathy.  HIV was negative and ANA and RF were negative.  His CMV and RMSF were both negative.  His Coxsackie B2 titer was elevated.  He was started on beta blocker, ACE inhibitor, hydralazine, nitrates, spironolactone and digoxin.  Milrinone was weaned off and he showed dramatic improvement clinically.  He was not interested in a LifeVest.  He returns today for early follow up.  He is doing well.  Has some tightness in chest at times.  No orthopnea or PND.  Dyspnea is improved.  Describes NYHA class 2 symptoms.  No syncope or palpitations.  Taking all medications.  Not yet eager to get back to work.  Does get lightheaded with standing.  Feels his heart racing sometimes too.  Past Medical History  Diagnosis Date  . history Marijuana smoker   . Elevated LFTs     likely hepatic congestion from CHF  . Colitis 10/12/10    ER visit  . 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 cath 11/22/10: normal cors; EF 15-20%;  b. echo 11/19/10: EF 20%, mild MR, mild LAE, PASP 48; c. dx 5/12, tx with IV milrinone for low output, Coxsackie B2 titer elevated; o/w HIV, CMV, RMSF, ANA, RF, ESR, ACE all normal  . Chronic systolic heart failure     Current Outpatient Prescriptions  Medication Sig Dispense Refill  . acetaminophen (TYLENOL) 325 MG tablet Take 650 mg by mouth every 6 (six) hours as needed.        . carvedilol (COREG) 6.25 MG tablet Take 6.25 mg by mouth 2 (two) times daily with a meal.        . digoxin (LANOXIN) 0.25 MG tablet Take 250 mcg by mouth daily.        . furosemide (LASIX) 40 MG tablet Take 0.5 tablets (20 mg total)  by mouth every other day.      . hydrALAZINE (APRESOLINE) 25 MG tablet Take 25 mg by mouth. 1 and 1/2 po q8 hours       . isosorbide mononitrate (IMDUR) 30 MG 24 hr tablet Take 30 mg by mouth daily.        Marland Kitchen lisinopril (PRINIVIL,ZESTRIL) 20 MG tablet Take 20 mg by mouth 2 (two) times daily.        Marland Kitchen spironolactone (ALDACTONE) 25 MG tablet Take 25 mg by mouth daily.        Marland Kitchen DISCONTD: carvedilol (COREG) 3.125 MG tablet Take 3.125 mg by mouth 2 (two) times daily with a meal.        . DISCONTD: furosemide (LASIX) 40 MG tablet Take 20 mg by mouth daily.        Marland Kitchen DISCONTD: hyoscyamine (LEVSIN, ANASPAZ) 0.125 MG tablet Take 0.125 mg by mouth every 6 (six) hours as needed.        Marland Kitchen DISCONTD: metroNIDAZOLE (FLAGYL) 500 MG tablet Take 500 mg by mouth 3 (three) times daily.        Marland Kitchen DISCONTD: oxyCODONE-acetaminophen (PERCOCET) 10-325 MG per tablet Take 1 tablet by mouth every 6 (six) hours as needed.        Marland Kitchen DISCONTD:  predniSONE (DELTASONE) 50 MG tablet Take 50 mg by mouth 2 (two) times daily.          Allergies: No Known Allergies  Social History History  Substance Use Topics  . Smoking status: Never Smoker   . Smokeless tobacco: Never Used  . 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

## 2010-12-01 LAB — DIGOXIN LEVEL: Digoxin Level: 1.3 ng/mL (ref 0.8–2.0)

## 2010-12-04 NOTE — H&P (Signed)
Tyrone Walker, Tyrone Walker              ACCOUNT NO.:  [id number]  MEDICAL RECORD NO.:  [id number]           PATIENT TYPE:  LOCATION:                                 FACILITY:  PHYSICIAN:  Nishita Isaacks A. Tyrone Walker, M.D.    DATE OF BIRTH:  1976/06/14  DATE OF ADMISSION:  11/19/2010 DATE OF DISCHARGE:                             HISTORY & PHYSICAL   CHIEF COMPLAINT:  Epigastric pain and shortness of breath.  HISTORY OF PRESENT ILLNESS:  The patient is a 35 year old man admitted for epigastric pain x1 month and shortness of breath off and on x1 month.  Describes epigastric pain as sharp, worse at night.  Every night has acid reflux symptoms that comes up in the throat and has to vomit in order to get relief.  No dark stools.  No bloody stools.  Positive shortness of breath off and on x1 month.  Occasional lower extremity edema.  Able to "breathe better" when sitting up in upright position, seen in ER for approximately 1 week ago and was set up for cardiology consult outpatient.  The patient seen by GI consult as outpatient after April 12 CT scan showed colitis and GI would like to do colonoscopy.  We will not do this per patient report until he is cleared by cards.  REVIEW OF SYSTEMS:  No fevers no chills, no chest pain, no headache, no cough, no wheezing.  No hemoptysis, no PND, no dysuria, no rash, no polyuria.  Positive review of systems for occasional lower extremity edema.  Occasional orthopnea, positive dyspnea.  Positive nausea, positive vomiting at night with reflux symptoms.  ALLERGIES:  No known drug allergies.  MEDICATIONS:  None.  PAST MEDICAL HISTORY:  Colitis on April 12 ER visit.  PAST SURGICAL HISTORY:  None.  SOCIAL HISTORY:  Lives with newborn and son and girlfriend.  He is a Investment banker, operational.  He denies tobacco use.  He denies alcohol use.  He denies marijuana use for the past 3-4 months.  FAMILY HISTORY:  Mother, diabetes; father, sleep apnea; siblings healthy.  PHYSICAL  EXAMINATION:  VITAL SIGNS:  Temperature 99.0, pulse 110, respiratory rate 22, blood pressure 140/96, pulse ox 94% on room air. GENERAL:  No acute distress, resting quietly. HEENT:  Normocephalic, atraumatic.  Mucous membranes moist. CARDIOVASCULAR:  Regular rate and rhythm.  No murmurs, rubs, or gallops. No increased work of breathing. LUNGS:  Clear to auscultation.  Good air movement.  No crackles.  No wheezing. ABDOMEN:  Soft, positive tenderness to palpation in epigastric area.  No rebound, no guarding. EXTREMITIES:  No lower extremity edema. NEURO:  Alert and oriented x3. MUSCULOSKELETAL:  Moving all 4 extremities.  LABS AND STUDIES:  EKG showed sinus tach, no ST wave changes.  Lipase 14.  Cardiac enzymes x1.  Cardiac enzymes 2 and 3 pending.  Sodium 137, potassium 3.9, creatinine 1.10, glucose 100, AST 51, ALT 104.  BNP 1316. Portable chest x-ray showed stable moderate enlargement of cardiac mediastinal silhouette, no new finding.  White blood cells 7.1, hemoglobin 14.2, platelets 186,000.  ASSESSMENT/PLAN:  The patient is a 35 year old man with epigastric pain and shortness of  breath. 1. Epigastric pain.  History of colitis in April 2012 ED visit. Pain     was right upper quadrant at that time per patient.  The patient was     evaluated by GI.  They planned colonoscopy as outpatient, waiting     heart eval as outpatient prior to procedure secondary to recent     shortness of breath.  Findings today most consistent with reflux,     pain now more epigastric in nature consistent with reflux and that     is worse at night, a bitter taste in food, regurg at night,     vomiting in order to clear reflux material.  GI cocktail given in     the ED.  The patient on Protonix daily.  We will monitor abdominal     pain and exam. 2. Shortness of breath.  No increased work of breathing on exam.     Lungs clear on exam, pulse ox was within normal limits.  Unclear     why the patient has  shortness of breath off and on x1 month.  BNP     1316.  Reports lower extremity edema but none on exam.  Positive     questionable cardiac enlargement on chest x-ray, need to have echo     to better eval, also repeat chest x-ray this a.m. 2 view.  We will     give Lasix 40 mg IV in a.m.  The patient had one dose in the     emergency department and had improvement of shortness of breath. 3. Hypertension, question of history.  The patient denies history of     hypertension, some hypertension although noted in old ER records in     the vital signs evaluation.  We will monitor closely.  If untreated     hypertension, this could be the cause of possible cardiomegaly or     possible congestive heart failure. 4. Elevated LFTs.  AST 51, ALT 104, unsure of cause.  We will monitor     closely. 5. Fluids, electrolytes, nutrition/gastrointestinal.  Clears currently     to advance as tolerated. 6. Prophylaxis.  Heparin. 7. Disposition pending clinical improvement.     Ellin Mayhew, MD   ______________________________ Tyrone Walker. Tyrone Walker, M.D.    DC/MEDQ  D:  11/19/2010  T:  11/19/2010  Job:  [id number]  Electronically Signed by Ellin Mayhew  on 11/27/2010 10:05:40 AM Electronically Signed by Zachery Dauer M.D. on 12/04/2010 07:24:44 AM

## 2010-12-05 ENCOUNTER — Encounter: Payer: Self-pay | Admitting: Family Medicine

## 2010-12-05 ENCOUNTER — Ambulatory Visit (INDEPENDENT_AMBULATORY_CARE_PROVIDER_SITE_OTHER): Payer: Medicaid Other | Admitting: Family Medicine

## 2010-12-05 VITALS — BP 121/78 | HR 111 | Temp 98.3°F | Wt 232.0 lb

## 2010-12-05 DIAGNOSIS — H538 Other visual disturbances: Secondary | ICD-10-CM

## 2010-12-05 DIAGNOSIS — I428 Other cardiomyopathies: Secondary | ICD-10-CM

## 2010-12-05 DIAGNOSIS — G4440 Drug-induced headache, not elsewhere classified, not intractable: Secondary | ICD-10-CM

## 2010-12-05 DIAGNOSIS — R51 Headache: Secondary | ICD-10-CM

## 2010-12-05 MED ORDER — KETOROLAC TROMETHAMINE 60 MG/2ML IM SOLN
60.0000 mg | Freq: Once | INTRAMUSCULAR | Status: AC
  Administered 2010-12-05: 60 mg via INTRAMUSCULAR

## 2010-12-05 MED ORDER — ISOSORBIDE MONONITRATE ER 30 MG PO TB24: ORAL_TABLET | ORAL | Status: DC

## 2010-12-05 NOTE — Assessment & Plan Note (Signed)
Stable. Pt reports feeling better each day. Plan to continue current management, except will cut the dose of imdur in half.  Will recheck coxsackie B2 titer at the end of the month.

## 2010-12-05 NOTE — Assessment & Plan Note (Signed)
Headache most likely 2/2 to imdur.  I called [facility] today and spoke to the Cardiologist in the office who was familiar with [name] and recommended cutting the dose of imdur in half and slowly stepping the dose back up as tolerated.  Plan to continue NSAID use will give a does of IM Toradol today.

## 2010-12-05 NOTE — Assessment & Plan Note (Signed)
Acute change since new illness. Reassuring exam with no visual loss or eye pain. Plan to refer pt to Ophthalmology for dilated eye exam to assess for ischemic changes.

## 2010-12-05 NOTE — Progress Notes (Signed)
  Subjective:    Patient ID: Tyrone Walker, male    DOB: [birth year], 35 y.o.   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 He is currently on multiple medications, including a BB and imdur. He is taking all of his medications as prescribed. He is being followed closely by [facility].  Main concern is headaches, constant, frontal, sensitivity to light, not to sound, worse 1 hr post hydralazine, he describes pain as 8/10, throbbing, relieved by rest and external tension on his head.    Review of Systems  Cardiovascular: Positive for palpitations. Negative for chest pain and leg swelling.  Palpitations are intermittent brought on with exertion, he denies PND and orthopnea.      Objective:   Physical Exam  Constitutional: He is oriented to person, place, and time. He appears well-developed and well-nourished.  Eyes: Conjunctivae and EOM are normal. Pupils are equal, round, and reactive to light.  Fundoscopic exam:      The right eye shows no hemorrhage and no papilledema.       The left eye shows no hemorrhage and no papilledema.  Cardiovascular: Normal rate, regular rhythm, normal heart sounds and intact distal pulses.  Exam reveals no gallop and no friction rub.   No murmur heard.      No JVD or hepatojugular reflex. No LE edema.   Pulmonary/Chest: Effort normal and breath sounds normal. He has no wheezes. He has no rales.  Neurological: He is alert and oriented to person, place, and time. No cranial nerve deficit.          Assessment & Plan:

## 2010-12-05 NOTE — Patient Instructions (Addendum)
Tyrone Walker,  Thank you for coming in today.  For your headache, I have called the cardiologist who agree that it is most likely the imdur causing your headache. It is ok to cut the imdur in half for now and it should get better with time as your body adjust to the medication.   I have sent an Ophthalmology referral, one of my nurses will call you with the details about scheduling an appointment.  You will be due for repeat titers at the end of the month, come in for a lab draw.   Please schedule f/u for 4-6 weeks to review blood work and Ophthalmology visit.

## 2010-12-06 ENCOUNTER — Other Ambulatory Visit: Payer: Self-pay | Admitting: Family Medicine

## 2010-12-06 ENCOUNTER — Telehealth: Payer: Self-pay | Admitting: *Deleted

## 2010-12-06 MED ORDER — ONDANSETRON HCL 4 MG PO TABS: 4.0000 mg | ORAL_TABLET | Freq: Three times a day (TID) | ORAL | Status: AC | PRN

## 2010-12-06 NOTE — Telephone Encounter (Signed)
Received call from [facility]  Nurse with [facility] and she states patient continues with nausea all day headache is better. No vomiting.   Attempted to page Dr. Armen Pickup , unavailable . Consulted with Dr. Jennette Kettle and she will send in RX for nausea . [facility] [address]

## 2010-12-08 ENCOUNTER — Other Ambulatory Visit: Payer: Self-pay | Admitting: Family Medicine

## 2010-12-08 DIAGNOSIS — G4440 Drug-induced headache, not elsewhere classified, not intractable: Secondary | ICD-10-CM

## 2010-12-08 MED ORDER — ASPIRIN-ACETAMINOPHEN-CAFFEINE 250-250-65 MG PO TABS: 1.0000 | ORAL_TABLET | Freq: Four times a day (QID) | ORAL | Status: AC | PRN

## 2010-12-08 NOTE — Assessment & Plan Note (Signed)
Toradol IM relieved HA fr a few days. Now it has returned. Same quality.  Pt would prefer not to use Percocet. Plan to use Excedrin XR.

## 2010-12-08 NOTE — Telephone Encounter (Signed)
Called pt on cell.  See AP under problem list. Pt agreed to use Excedrin.

## 2010-12-12 ENCOUNTER — Encounter: Payer: Self-pay | Admitting: Cardiovascular Disease

## 2010-12-18 ENCOUNTER — Ambulatory Visit (INDEPENDENT_AMBULATORY_CARE_PROVIDER_SITE_OTHER): Payer: Medicaid Other | Admitting: Physician Assistant

## 2010-12-18 ENCOUNTER — Encounter: Payer: Self-pay | Admitting: Physician Assistant

## 2010-12-18 VITALS — BP 112/82 | HR 71 | Resp 18 | Ht 73.0 in | Wt 235.8 lb

## 2010-12-18 DIAGNOSIS — I5023 Acute on chronic systolic (congestive) heart failure: Secondary | ICD-10-CM | POA: Insufficient documentation

## 2010-12-18 DIAGNOSIS — R0989 Other specified symptoms and signs involving the circulatory and respiratory systems: Secondary | ICD-10-CM

## 2010-12-18 DIAGNOSIS — R0609 Other forms of dyspnea: Secondary | ICD-10-CM

## 2010-12-18 LAB — BASIC METABOLIC PANEL
BUN: 17 mg/dL (ref 6–23)
CO2: 28 mEq/L (ref 19–32)
Chloride: 104 mEq/L (ref 96–112)
Creatinine, Ser: 1.3 mg/dL (ref 0.4–1.5)
Glucose, Bld: 119 mg/dL — ABNORMAL HIGH (ref 70–99)

## 2010-12-18 LAB — BRAIN NATRIURETIC PEPTIDE: Pro B Natriuretic peptide (BNP): 56 pg/mL (ref 0.0–100.0)

## 2010-12-18 MED ORDER — FUROSEMIDE 40 MG PO TABS: 20.0000 mg | ORAL_TABLET | Freq: Every day | ORAL | Status: DC

## 2010-12-18 NOTE — Patient Instructions (Signed)
Your physician recommends that you schedule a follow-up appointment in: 12/25/10 @ 8:30 to see Tereso Newcomer, PA-C that same day we will repeat lab work.  Your physician recommends that you return for lab work in: today bmet/ bnp 428.23  Your physician has recommended you make the following change in your medication: INCREASE LASIX 20 MG TAKE 1/2 (HALF) TABLET EVERY DAY.

## 2010-12-18 NOTE — Progress Notes (Signed)
History of Present Illness: Primary Cardiologist:  Dr. Arvilla Meres  Tyrone Walker is a 35 y.o. male who was admitted 5/26-6/3 with new onset systolic CHF.  He ruled out for MI.  Echo demonstrated EF 20%.  Cath demonstrated normal cors and elevated filling pressures.  He was diuresed and treated on IV Milrinone for low output.  He had serology done to evaluate etiology of his myopathy.  All titers were negative except for Coxsackie B2 titer was elevated.   I saw him 2 weeks ago for follow up.  He was doing well.  Was getting lightheaded.  I changed his Lasix every other day.  Basic metabolic panel was unremarkable except for elevated sugar.  Digoxin level was normal.   He returns for follow up.   He has gradually felt like his breathing is getting more short.  He describes class III symptoms.  He feels like he is building up with fluid.  His weight is up 3 pounds since I last saw him.  He denies orthopnea or PND.  He denies chest pain or syncope.  He denies any swelling.  He feels like his stomach is swollen.  He is limiting his salt.  He is compliant with all his medications.  He is concerned about being out in the heat with the poor air quality.  Past Medical History  Diagnosis Date  . history Marijuana smoker   . Elevated LFTs     likely hepatic congestion from CHF  . Colitis 10/12/10    ER visit  . 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 cath 11/22/10: normal cors; EF 15-20%;  b. echo 11/19/10: EF 20%, mild MR, mild LAE, PASP 48; c. dx 5/12, tx with IV milrinone for low output, Coxsackie B2 titer elevated; o/w HIV, CMV, RMSF, ANA, RF, ESR, ACE all normal  . Chronic systolic heart failure     Current Outpatient Prescriptions  Medication Sig Dispense Refill  . acetaminophen (TYLENOL) 325 MG tablet Take 650 mg by mouth every 6 (six) hours as needed.       Marland Kitchen aspirin-acetaminophen-caffeine (EXCEDRIN MIGRAINE) 250-250-65 MG per tablet Take 1 tablet by mouth every 6 (six) hours as needed for  pain.  30 tablet  0  . carvedilol (COREG) 6.25 MG tablet Take 6.25 mg by mouth 2 (two) times daily with a meal.        . digoxin (LANOXIN) 0.25 MG tablet Take 250 mcg by mouth daily.        . furosemide (LASIX) 40 MG tablet Take 0.5 tablets (20 mg total) by mouth every other day.      . hydrALAZINE (APRESOLINE) 25 MG tablet Take 25 mg by mouth. 1 and 1/2 po q8 hours       . isosorbide mononitrate (IMDUR) 30 MG 24 hr tablet Take 1/2 tab by mouth daily.      Marland Kitchen lisinopril (PRINIVIL,ZESTRIL) 20 MG tablet Take 20 mg by mouth 2 (two) times daily.        Marland Kitchen spironolactone (ALDACTONE) 25 MG tablet Take 25 mg by mouth daily.          Allergies: No Known Allergies  Social History History  Substance Use Topics  . Smoking status: Never Smoker   . Smokeless tobacco: Never Used  . 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  Vital Signs: BP 112/82  Pulse 71  Resp 18  Ht 6\' 1"  (1.854 m)  Wt 235  lb 12.8 oz (106.958 kg)  BMI 31.11 kg/m2  PHYSICAL EXAM: Well nourished, well developed, in no acute distress HEENT: normal Neck: JVP minimally elevated Cardiac:  normal S1, S2; RRR; no murmur, no gallops Lungs:  clear to auscultation bilaterally, no wheezing, rhonchi or rales Abd: soft, nontender, no hepatomegaly Ext: no edema;  Skin: warm and dry Neuro:  CNs 2-12 intact, no focal abnormalities noted  EKG:  Sinus rhythm, heart rate 71, normal axis, LVH, poor R wave progression, nonspecific ST-T wave changes  ASSESSMENT AND PLAN:

## 2010-12-18 NOTE — Assessment & Plan Note (Addendum)
His weight is up 3 pounds since I last saw him.  His breathing is more short.  On exam today, his volume does not look too bad.  His JVP is up some.  I would like him to go back to Lasix every day.  He'll get a basic metabolic panel and BNP today.  I'll see him back in a week and we will repeat his basic metabolic panel at that time.  He knows to continue to weigh himself and a call us if continues to feel more and more short of breath or notices increased weight gain.  We discussed the need to stay indoors when air quality is poor in the summer months.   Heart rate better than last time.  I would like to try to increase his carvedilol next time if possible.

## 2010-12-20 ENCOUNTER — Other Ambulatory Visit: Payer: Self-pay | Admitting: Family Medicine

## 2010-12-20 NOTE — Telephone Encounter (Signed)
Refill request

## 2010-12-21 ENCOUNTER — Telehealth: Payer: Self-pay | Admitting: *Deleted

## 2010-12-21 DIAGNOSIS — I5022 Chronic systolic (congestive) heart failure: Secondary | ICD-10-CM

## 2010-12-21 NOTE — Telephone Encounter (Signed)
lmom for ptcb for lab results and lasix info

## 2010-12-22 ENCOUNTER — Telehealth: Payer: Self-pay | Admitting: *Deleted

## 2010-12-22 DIAGNOSIS — I5022 Chronic systolic (congestive) heart failure: Secondary | ICD-10-CM

## 2010-12-22 NOTE — Telephone Encounter (Signed)
Repeat bmet 428.23 pt aware of lab results

## 2010-12-25 ENCOUNTER — Other Ambulatory Visit (INDEPENDENT_AMBULATORY_CARE_PROVIDER_SITE_OTHER): Payer: Medicaid Other | Admitting: *Deleted

## 2010-12-25 ENCOUNTER — Encounter: Payer: Self-pay | Admitting: Physician Assistant

## 2010-12-25 ENCOUNTER — Ambulatory Visit (INDEPENDENT_AMBULATORY_CARE_PROVIDER_SITE_OTHER): Payer: Medicaid Other | Admitting: Physician Assistant

## 2010-12-25 VITALS — BP 136/84 | HR 60 | Resp 16 | Ht 73.0 in | Wt 238.0 lb

## 2010-12-25 DIAGNOSIS — I5023 Acute on chronic systolic (congestive) heart failure: Secondary | ICD-10-CM

## 2010-12-25 DIAGNOSIS — I5022 Chronic systolic (congestive) heart failure: Secondary | ICD-10-CM

## 2010-12-25 DIAGNOSIS — R21 Rash and other nonspecific skin eruption: Secondary | ICD-10-CM | POA: Insufficient documentation

## 2010-12-25 LAB — BASIC METABOLIC PANEL
BUN: 21 mg/dL (ref 6–23)
CO2: 29 mEq/L (ref 19–32)
Chloride: 104 mEq/L (ref 96–112)
Potassium: 4.4 mEq/L (ref 3.5–5.1)

## 2010-12-25 MED ORDER — SPIRONOLACTONE 25 MG PO TABS: 25.0000 mg | ORAL_TABLET | Freq: Every day | ORAL | Status: DC

## 2010-12-25 MED ORDER — ISOSORBIDE MONONITRATE ER 30 MG PO TB24: ORAL_TABLET | ORAL | Status: DC

## 2010-12-25 MED ORDER — CARVEDILOL 6.25 MG PO TABS: 6.2500 mg | ORAL_TABLET | Freq: Two times a day (BID) | ORAL | Status: DC

## 2010-12-25 MED ORDER — FUROSEMIDE 40 MG PO TABS: 40.0000 mg | ORAL_TABLET | Freq: Every day | ORAL | Status: DC

## 2010-12-25 NOTE — Patient Instructions (Addendum)
Your physician recommends that you schedule a follow-up appointment in: 01/31/11 @ 9:15 TO SEE DR. Gala Romney   PRESCRIPTION REFILLS HAVE BEEN SENT TO [facility] ON [address] FOR YOU TODAY

## 2010-12-25 NOTE — Assessment & Plan Note (Signed)
Probably related to new soap.  Change soap to dye free.  May use PRN anti-histamines.  Follow up with PCP if no improvement.

## 2010-12-25 NOTE — Progress Notes (Signed)
History of Present Illness: Primary Cardiologist:  Dr. Arvilla Meres  Tyrone Walker is a 35 y.o. male who was admitted 5/26-6/3 with new onset systolic CHF.  He ruled out for MI.  Echo demonstrated EF 20%.  Cath demonstrated normal cors and elevated filling pressures.  He was diuresed and treated on IV Milrinone for low output.  He had serology done to evaluate etiology of his myopathy.  All titers were negative except for Coxsackie B2 titer was elevated.   I saw him 2 weeks ago for follow up.  He was doing well.  Was getting lightheaded.  I changed his Lasix every other day.  Basic metabolic panel was unremarkable except for elevated sugar.  Digoxin level was normal.     I saw him last week and he felt more short of breath.  His weight was up.  We increased his lasix to 40 mg QD.  His BNP was normal.  However, he continued on the same dose of lasix.  Feels much better.  Feels like he is breathing better.  No orthopnea, PND or edema.  Eating a lot more.  Feels like weight gain probably related.  No chest pain.  No syncope.  Past Medical History  Diagnosis Date  . history Marijuana smoker   . Elevated LFTs     likely hepatic congestion from CHF  . Colitis 10/12/10    ER visit  . 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 cath 11/22/10: normal cors; EF 15-20%;  b. echo 11/19/10: EF 20%, mild MR, mild LAE, PASP 48; c. dx 5/12, tx with IV milrinone for low output, Coxsackie B2 titer elevated; o/w HIV, CMV, RMSF, ANA, RF, ESR, ACE all normal  . Chronic systolic heart failure     Current Outpatient Prescriptions  Medication Sig Dispense Refill  . acetaminophen (TYLENOL) 325 MG tablet Take 650 mg by mouth every 6 (six) hours as needed.       Marland Kitchen aspirin-acetaminophen-caffeine (EXCEDRIN MIGRAINE) 250-250-65 MG per tablet Take 1 tablet by mouth every 6 (six) hours as needed.        . carvedilol (COREG) 6.25 MG tablet Take 6.25 mg by mouth 2 (two) times daily with a meal.        . digoxin (LANOXIN) 0.25  MG tablet Take 250 mcg by mouth daily.        . furosemide (LASIX) 40 MG tablet Take 0.5 tablets (20 mg total) by mouth daily.  30 tablet  6  . hydrALAZINE (APRESOLINE) 25 MG tablet Take 25 mg by mouth. 1 and 1/2 po q8 hours       . isosorbide mononitrate (IMDUR) 30 MG 24 hr tablet Take 1/2 tab by mouth daily.      Marland Kitchen lisinopril (PRINIVIL,ZESTRIL) 20 MG tablet Take 20 mg by mouth 2 (two) times daily.        Marland Kitchen spironolactone (ALDACTONE) 25 MG tablet Take 25 mg by mouth daily.          Allergies: No Known Allergies  Social History History  Substance Use Topics  . Smoking status: Never Smoker   . Smokeless tobacco: Never Used  . 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  Has a rash on upper torso; pruritic.  Started using new soap recently.    Vital Signs: BP 136/84  Pulse 60  Resp 16  Ht 6\' 1"  (1.854 m)  Wt 238 lb (107.956 kg)  BMI 31.40 kg/m2  PHYSICAL EXAM: Well nourished, well developed, in no acute distress HEENT: normal Neck: no JVD Cardiac:  normal S1, S2; RRR; no murmur, no gallops Lungs:  clear to auscultation bilaterally, no wheezing, rhonchi or rales Abd: soft, nontender, no hepatomegaly Ext: no edema;  Skin: warm and dry, scattered papular rash on upper chest and shoulders with minimal excoriation; no burrows noted Neuro:  CNs 2-12 intact, no focal abnormalities noted  ASSESSMENT AND PLAN:

## 2010-12-25 NOTE — Assessment & Plan Note (Signed)
Volume stable.  Continue current meds.  Check BMET today.  If BUN and/or creatinine going up, cut back on Lasix.  Follow up with Dr. Gala Romney as scheduled, or sooner PRN.

## 2010-12-26 ENCOUNTER — Other Ambulatory Visit: Payer: Self-pay | Admitting: Family Medicine

## 2010-12-26 ENCOUNTER — Telehealth: Payer: Self-pay | Admitting: Internal Medicine

## 2010-12-26 ENCOUNTER — Telehealth: Payer: Self-pay | Admitting: *Deleted

## 2010-12-26 DIAGNOSIS — I428 Other cardiomyopathies: Secondary | ICD-10-CM

## 2010-12-26 MED ORDER — LISINOPRIL 20 MG PO TABS: 20.0000 mg | ORAL_TABLET | Freq: Two times a day (BID) | ORAL | Status: DC

## 2010-12-26 MED ORDER — ISOSORBIDE MONONITRATE ER 30 MG PO TB24: ORAL_TABLET | ORAL | Status: DC

## 2010-12-26 MED ORDER — DIGOXIN 250 MCG PO TABS: 250.0000 ug | ORAL_TABLET | Freq: Every day | ORAL | Status: DC

## 2010-12-26 NOTE — Telephone Encounter (Signed)
Pt needs a refill on all his meds and we have not responded to the [facility] contacting us pls call all his meds into [facility] at [phone] and pls make sure to add coreg 6.25mg  qd and isosorbide 30mg  tab qd because that is not on the list he is waiting because he is out of meds pls call asap

## 2010-12-26 NOTE — Telephone Encounter (Signed)
Refill request

## 2010-12-26 NOTE — Telephone Encounter (Signed)
lmom for pt rx were sent in yesterday during his ov w/SW. S/w pharm today and gave verbal orders due to pharm states they did not rcv yesterday. Danielle Rankin

## 2010-12-26 NOTE — Telephone Encounter (Signed)
Hi Heather, I sent these rx yesterday and I called the pharm today and they said they did not rcv, the pharm said someone just called and gave orders. Danielle Rankin

## 2010-12-26 NOTE — Telephone Encounter (Signed)
Pt states he is having trouble getting all meds refilled, I called [facility] and gave verbal refills on dig, imdur and lisinopril

## 2010-12-28 NOTE — Discharge Summary (Signed)
Tyrone Walker, Tyrone Walker              ACCOUNT NO.:  [id number]  MEDICAL RECORD NO.:  [id number]           PATIENT TYPE:  I  LOCATION:  2925                         FACILITY:  [facility]  PHYSICIAN:  Kagen Kunath A. Sheffield Slider, M.D.    DATE OF BIRTH:  [birth year]  DATE OF ADMISSION:  11/18/2010 DATE OF DISCHARGE:  11/26/2010                              DISCHARGE SUMMARY   PRIMARY CARE PROVIDER:  [name], MD, at [facility].  DISCHARGE DIAGNOSES: 1. Severe dilated cardiomyopathy, EF 20%. 2. Shortness of breath, resolved. 3. Epigastric pain, resolved. 4. Colitis on October 05, 2010, resolved.  DISCHARGE MEDICATIONS: 1. Tylenol 650 mg p.o. q.6 hours p.r.n. pain. 2. Coreg 6.25 mg p.o. b.i.d. with meals. 3. Digoxin 0.25 mg p.o. daily. 4. Lasix 20 mg every other day, if he experiences dizziness. 5. Hydralazine 37.5 mg p.o. q.8 hours. 6. Imdur 30 mg p.o. daily. 7. Lisinopril 20 mg p.o. b.i.d. 8. Spironolactone 25 mg p.o. daily. 9. Percocet 5/325 one tab p.o. t.i.d. p.r.n. pain.  Medications which the patient was instructed to stop include: 1. Prednisone 5 mg p.o. daily. 2. Hyoscyamine 0.125 mg p.o. b.i.d.  CONSULTS:  Bevelyn Buckles. Bensimhon, MD, [facility].  PROCEDURES: 1. Echocardiogram on Nov 19, 2010, showing severely dilated left     ventricle, normal wall thickness, estimated ejection fraction of     20%, diffuse hypokinesis. 2. Cardiac catheterization on Nov 21, 2010, showing normal coronary     anatomy, severe left ventricular dysfunction with low cardiac     output, moderate pulmonary hypertension with elevated left     ventricular filling pressures. 3. PICC line placement on Nov 22, 2010.  LABORATORY DATA:  At the time of admission, the patient's CBC was stable.  White count of 7.1, hemoglobin of 14.2, platelets of 186, this remained relatively unchanged at the time of discharge.  CMET within normal limits with the exception of a low alk phos at 36,  elevated AST/ALT at 51/104, and low albumin at 3.4.  By discharge, this had normalized, normal alk phos of 49, normal AST/ALT at 16/32.  Initial BNP of 1316, improved to 431 at the time of discharge.  Normal TSH 0.955 and 1.271, normal free T4 at 1.26.  Negative cardiac enzymes x3, lipase of 14, ACE level of 5.  UDS positive for marijuana, negative stool culture, negative C. diff, negative HIV.  Anemia panel showing low iron at 35, normal TIBC at 286, and percent saturation at 12.  Fasting lipid panel showing cholesterol 117, triglycerides at 224, HDL at 28, LDL at 44, negative ANA, negative RPR, negative CMV, negative [facility] spotted fever, negative Coxsackie A antibodies, elevated Coxsackie B2 antibody with titer of 1:8.  BRIEF HOSPITAL COURSE:  This is a 35 year old male without significant past medical history presenting with epigastric pain and shortness of breath found to have a severe dilated cardiomyopathy.  1. Dilated cardiomyopathy.  The patient on initial presentation had been    diagnosed approximately 1 month prior with a colitis as the source of    his diarrhea and abdominal pain.  On admission at this time, the  patient's BNP was elevated into the 1300.  Cardiology was involved.    Echocardiogram was done and patient was found to have 20% ejection    fraction.  Cardiac catheterization was done by Cardiology which found    clear coronary arteries, leading diagnosis for cause of his dilated    cardiomyopathy and otherwise healthy 35 year old male without a history    of hypertension or alcohol use, is most likely to be from a viral    etiology.  Coxsackie B2 titer was the threshold for elevated.  It is    recommended that this could be repeated in 4 weeks in order to see if    this titer increases further.  If the titer does increase this to 1-64,    it could be felt that this could be diagnostically considered the cause    of this dilated cardiomyopathy.   Regardless of the cause, after    patient's cardiac cath and severe ejection fraction, it was discovered    by Cardiology, he was placed on aggressive ionotropic therapy with a    milrinone drip.  The patient remained on a milrinone drip in the cardiac    ICU from Nov 21, 2010, until November 25, 2010. Over the course of the    milrinone drip, the patient's cardiac medications were adjusted by    Cardiology primarily.  The patient was started on digoxin and milrinone    drip was weaned.  The patient had been off of the milrinone drip for    greater than 24 hours at the time of discharge without any repeat in    fluid retention.  The patient remained relatively stable throughout all    of this.  He did have a PICC line placed in order to follow CVPs and    volume status by Cardiology, the PICC would no longer need on the day of    discharge and the patient had improved cardiac output based on CVPs.  The patient's medication regimen at the time of discharge include: 1. Coreg 6.25 b.i.d. 2. Hydralazine 37.5 mg t.i.d. 3. Imdur 30 mg daily. 4. Lisinopril 20 mg b.i.d. 5. Spironolactone 25 mg daily. 6. Digoxin 0.25 mg daily. 7. Lasix 20 mg daily, if patient begins to experience dizziness, he     was instructed to decrease his Lasix dose to 20 mg every other day.  ADDITIONAL PROBLEMS: 2. Elevated LFTs.  On admission the patient's AST and ALT were     elevated from an unknown cause, be normalized spontaneously and     prior to discharge were within normal limits. 3. Epigastric pain/diarrhea.  This is felt to likely be due to     colitis, possibly due to Coxsackie B2 virus.  The patient's steroid     which had been prescribed as an outpatient was stopped.  His     diarrhea and epigastric pain spontaneously resolved themselves.     His stool cultures were negative and C. diff on his stool was     negative.  This diarrhea had resolved within 72 hours of being     admitted.  DISCHARGE INSTRUCTIONS:   The patient was instructed to increase activity slowly, continue a low-sodium, heart-healthy diet, and to not return to work until December 04, 2010, at the earliest once cleared by Cardiology. In addition, he was instructed to stop any activity causing chest pain, shortness of breath, dizziness, sweating, or excessive weakness and was made aware that both [facility] and  [facility] could be available 24/7 for any questions or concerns that he had and if there were any immediate concerns that he could and should return to the emergency department for evaluation.  FOLLOWUP: 1. Bevelyn Buckles. Bensimhon, MD, or Netta Cedars at [facility], the     patient should be seen sometime this coming week and will require a     BMET which Cardiology will set up at that time. 2. [name], MD, at [facility], the patient is     to call make an appointment within the next 3-4 weeks to be seen.     Issues for [name], MD, to consider include getting a     repeat Coxsackie viral titer in the next 4-6 weeks to assess if     Coxsackie B 2 was the true causative factor for this dilated     cardiomyopathy.  In addition, [name], MD, should be aware     that goal digoxin levels are being followed by Cardiology and     patient's potassium should be kept around 4.0 as he is on digoxin     and has a cardiomyopathy.  Go back to the patient instructions.     The patient was also instructed to abstain from alcohol and this     has been known to exacerbate cardiomyopathy.  DISCHARGE CONDITION:  The patient was discharged home in stable medical condition with clear understanding of his cardiac medication regimen with close followup by [facility].    ______________________________ Demetria Pore, MD   ______________________________ Arnette Norris. Sheffield Slider, M.D.    JM/MEDQ  D:  11/26/2010  T:  11/27/2010  Job:  [id number]  cc:   [name],  MD  Electronically Signed by Demetria Pore MD on 12/04/2010 11:37:34 AM Electronically Signed by Zachery Dauer M.D. on 12/28/2010 09:03:31 PM

## 2010-12-29 MED ORDER — HYDRALAZINE HCL 25 MG PO TABS: ORAL_TABLET | ORAL | Status: DC

## 2010-12-29 NOTE — Assessment & Plan Note (Signed)
Received med refill request from [facility]. Hydralazine is the only med that is due to be refilled soon. Reviewed most recent cardiology note. No dose change documented. Refilled hydralazine.

## 2010-12-29 NOTE — Telephone Encounter (Signed)
Refilled hydralazine

## 2011-01-26 ENCOUNTER — Telehealth: Payer: Self-pay | Admitting: *Deleted

## 2011-01-26 NOTE — Telephone Encounter (Signed)
Visiting nurse called regarding pt having chest pain and tightness pain is radiating to left arm,SOB with mild activity. Patient is seen spots B/P 130/60, O 2  Saturation 96 % at room air. Nurse was advice to let pt know he needs to go to the ER to be check. Pt. Verbalized understanding.Tyrone Walker and ER notified.

## 2011-01-31 ENCOUNTER — Encounter: Payer: Self-pay | Admitting: Internal Medicine

## 2011-01-31 ENCOUNTER — Ambulatory Visit (INDEPENDENT_AMBULATORY_CARE_PROVIDER_SITE_OTHER): Payer: Medicaid Other | Admitting: Internal Medicine

## 2011-01-31 VITALS — BP 138/84 | HR 80 | Ht 72.0 in | Wt 242.0 lb

## 2011-01-31 DIAGNOSIS — R0609 Other forms of dyspnea: Secondary | ICD-10-CM

## 2011-01-31 DIAGNOSIS — R0683 Snoring: Secondary | ICD-10-CM | POA: Insufficient documentation

## 2011-01-31 DIAGNOSIS — I5022 Chronic systolic (congestive) heart failure: Secondary | ICD-10-CM

## 2011-01-31 LAB — BASIC METABOLIC PANEL
BUN: 13 mg/dL (ref 6–23)
Chloride: 105 mEq/L (ref 96–112)
GFR: 87.34 mL/min (ref >60.00)
Potassium: 4.1 mEq/L (ref 3.5–5.1)
Sodium: 141 mEq/L (ref 135–145)

## 2011-01-31 LAB — BRAIN NATRIURETIC PEPTIDE: Pro B Natriuretic peptide (BNP): 51 pg/mL (ref 0.0–100.0)

## 2011-01-31 MED ORDER — CARVEDILOL 6.25 MG PO TABS: 9.3750 mg | ORAL_TABLET | Freq: Two times a day (BID) | ORAL | Status: DC

## 2011-01-31 NOTE — Patient Instructions (Signed)
Increase Carvedilol to 9.375 mg (1 & 1/2 tabs) Twice daily   Labs today  Your physician has recommended that you have a cardiopulmonary stress test (CPX). CPX testing is a non-invasive measurement of heart and lung function. It replaces a traditional treadmill stress test. This type of test provides a tremendous amount of information that relates not only to your present condition but also for future outcomes. This test combines measurements of you ventilation, respiratory gas exchange in the lungs, electrocardiogram (EKG), blood pressure and physical response before, during, and following an exercise protocol.  You have been referred to Pulmonary for Sleep Eval  Your physician recommends that you schedule a follow-up appointment in: 2 weeks in Heart Failure Clinic

## 2011-01-31 NOTE — Progress Notes (Signed)
History of Present Illness: Primary Cardiologist:  Dr. Arvilla Meres  Tyrone Walker is a 35 y.o. male who was admitted 5/26-6/3 with new onset systolic CHF.  He ruled out for MI.  Echo demonstrated EF 20%.  Cath demonstrated normal cors and elevated filling pressures.  He was diuresed and treated on IV Milrinone for low output.  He had serology done to evaluate etiology of his myopathy.  All titers were negative except for Coxsackie B2 titer was elevated.   I saw him 2 weeks ago for follow up.  He was doing well.  Was getting lightheaded.  I changed his Lasix every other day.  Basic metabolic panel was unremarkable except for elevated sugar.  Digoxin level was normal.     He has been following up with Tereso Newcomer last 2 months, last visit his lasix was increased to 40 mg daily because he felt more short of breath.  BNP normal at that time.  His weight has been trending up but he felt it was secondary to eating more.     He presents today for routine follow up but complains of increased SOB and difficulty breathing with activities around the house. Says he gets SOB walking less than 50 yards.  He took an extra dose of lasix last week because he felt he had fluid on board. He has been having difficulty sleeping, unsure if this is because of breathing or anxiety.  No orthopnea, PND or edema.  + heavy snoring Eating a lot more but. weight stable from last visit.  Having chest pain but this is worse with movement and has been ongoing for 2 weeks.  No syncope.  States compliance with medications.  Has been experiencing dizziness and weakness throughout the day.    We performed quick look echo today. EF 25%  Past Medical History  Diagnosis Date  . history Marijuana smoker   . Elevated LFTs     likely hepatic congestion from CHF  . Colitis 10/12/10    ER visit  . 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 cath 11/22/10: normal cors; EF 15-20%;  b. echo 11/19/10: EF 20%, mild MR, mild LAE, PASP 48; c. dx  5/12, tx with IV milrinone for low output, Coxsackie B2 titer elevated; o/w HIV, CMV, RMSF, ANA, RF, ESR, ACE all normal  . Chronic systolic heart failure     Current Outpatient Prescriptions  Medication Sig Dispense Refill  . acetaminophen (TYLENOL) 325 MG tablet Take 650 mg by mouth every 6 (six) hours as needed.       Marland Kitchen aspirin-acetaminophen-caffeine (EXCEDRIN MIGRAINE) 250-250-65 MG per tablet Take 1 tablet by mouth every 6 (six) hours as needed.        . carvedilol (COREG) 6.25 MG tablet Take 1 tablet (6.25 mg total) by mouth 2 (two) times daily with a meal.  60 tablet  11  . digoxin (LANOXIN) 0.25 MG tablet Take 1 tablet (250 mcg total) by mouth daily.  30 tablet  6  . furosemide (LASIX) 40 MG tablet Take 1 tablet (40 mg total) by mouth daily.  30 tablet  11  . hydrALAZINE (APRESOLINE) 25 MG tablet 1 and 1/2 po q8 hours  135 tablet  3  . isosorbide mononitrate (IMDUR) 30 MG 24 hr tablet Take 1/2 tab by mouth daily.  30 tablet  6  . lisinopril (PRINIVIL,ZESTRIL) 20 MG tablet Take 1 tablet (20 mg total) by mouth 2 (two) times daily.  30 tablet  6  . spironolactone (  ALDACTONE) 25 MG tablet Take 1 tablet (25 mg total) by mouth daily.  30 tablet  11    Allergies: No Known Allergies  Social History History  Substance Use Topics  . Smoking status: Never Smoker   . Smokeless tobacco: Never Used  . 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  Has a rash on upper torso; pruritic.  Started using new soap recently.    Vital Signs: BP 138/84  Pulse 80  Ht 6' (1.829 m)  Wt 242 lb (109.77 kg)  BMI 32.82 kg/m2  PHYSICAL EXAM: Well nourished, well developed, in no acute distress HEENT: normal Neck: Thick. Hard to assess JVP. Appears flat.  Cardiac:  normal S1, S2; RRR; no murmur, no gallops Lungs:  clear to auscultation bilaterally, no wheezing, rhonchi or rales Abd: obese soft, nontender, no hepatomegaly Ext: no  edema;  Skin: warm and dry, scattered papular rash on upper chest and shoulders with minimal excoriation; no burrows noted Neuro:  CNs 2-12 intact, no focal abnormalities noted  ASSESSMENT AND PLAN:

## 2011-01-31 NOTE — Assessment & Plan Note (Signed)
Appears improved but continues to c/o NYHA III symptoms and EF still markedly depressed by echo. Will check CPX to more adequately assess functional capacity.  Titrate carvedilol to 9.375 bid. Long talk about use of sliding scale lasix. Check labs today. F/u in HF clinic every two weeks.

## 2011-01-31 NOTE — Assessment & Plan Note (Signed)
Likely OSA. Refer for sleep study.

## 2011-02-05 ENCOUNTER — Ambulatory Visit (HOSPITAL_COMMUNITY): Payer: Medicaid Other | Attending: Internal Medicine

## 2011-02-05 DIAGNOSIS — R0602 Shortness of breath: Secondary | ICD-10-CM | POA: Insufficient documentation

## 2011-02-05 DIAGNOSIS — I5022 Chronic systolic (congestive) heart failure: Secondary | ICD-10-CM | POA: Insufficient documentation

## 2011-02-05 DIAGNOSIS — I509 Heart failure, unspecified: Secondary | ICD-10-CM

## 2011-02-15 ENCOUNTER — Ambulatory Visit (HOSPITAL_COMMUNITY)
Admission: RE | Admit: 2011-02-15 | Discharge: 2011-02-15 | Disposition: A | Discharge status: Home / Self Care | Payer: Medicaid Other | Source: Ambulatory Visit | Attending: Internal Medicine | Admitting: Internal Medicine

## 2011-02-15 VITALS — BP 108/76 | HR 62 | Ht 72.0 in | Wt 240.0 lb

## 2011-02-15 DIAGNOSIS — I5022 Chronic systolic (congestive) heart failure: Secondary | ICD-10-CM | POA: Insufficient documentation

## 2011-02-15 MED ORDER — CARVEDILOL 12.5 MG PO TABS: 12.5000 mg | ORAL_TABLET | Freq: Two times a day (BID) | ORAL | Status: DC

## 2011-02-15 NOTE — Progress Notes (Signed)
History of Present Illness: Primary Cardiologist:  Dr. Arvilla Meres  Tyrone Walker is a 35 y.o. male who was admitted 5/26-11/26/10  with new onset systolic CHF.  He ruled out for MI.  Echo demonstrated EF 20%.  Cath demonstrated normal cors and elevated filling pressures.  He was diuresed and treated on IV Milrinone for low output.  He had serology done to evaluate etiology of his myopathy.  All titers were negative except for Coxsackie B2 titer was elevated.    We saw him in clinic about 2 weeks ago and was c/o NYHA III symptoms. We increased carvedilol to 9.375 and ordered CPX test. Quick look echo 25%  He had CPX 02/05/11: pVO2 23.6 (71%)  Ve/VCO2 27 O2 pulse 80% RER 1.25 VE/MVV 56%. Feels much better. Able to go bowling. Dyspnea only with vigorous exercise. Now knows how to use sliding scale lasix. No orthopnea or PND.  No syncope.  States compliance with medications.  Has been experiencing dizziness and weakness throughout the day.     Past Medical History  Diagnosis Date  . history Marijuana smoker   . Elevated LFTs     likely hepatic congestion from CHF  . Colitis 10/12/10    ER visit  . 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 cath 11/22/10: normal cors; EF 15-20%;  b. echo 11/19/10: EF 20%, mild MR, mild LAE, PASP 48; c. dx 5/12, tx with IV milrinone for low output, Coxsackie B2 titer elevated; o/w HIV, CMV, RMSF, ANA, RF, ESR, ACE all normal  . Chronic systolic heart failure     Current Outpatient Prescriptions  Medication Sig Dispense Refill  . acetaminophen (TYLENOL) 325 MG tablet Take 650 mg by mouth every 6 (six) hours as needed.       Marland Kitchen aspirin-acetaminophen-caffeine (EXCEDRIN MIGRAINE) 250-250-65 MG per tablet Take 1 tablet by mouth every 6 (six) hours as needed.        . carvedilol (COREG) 6.25 MG tablet Take 1.5 tablets (9.375 mg total) by mouth 2 (two) times daily with a meal.  90 tablet  3  . digoxin (LANOXIN) 0.25 MG tablet Take 1 tablet (250 mcg total) by mouth  daily.  30 tablet  6  . furosemide (LASIX) 40 MG tablet Take 1 tablet (40 mg total) by mouth daily.  30 tablet  11  . hydrALAZINE (APRESOLINE) 25 MG tablet 1 and 1/2 po q8 hours  135 tablet  3  . isosorbide mononitrate (IMDUR) 30 MG 24 hr tablet Take 1/2 tab by mouth daily.  30 tablet  6  . lisinopril (PRINIVIL,ZESTRIL) 20 MG tablet Take 1 tablet (20 mg total) by mouth 2 (two) times daily.  30 tablet  6  . spironolactone (ALDACTONE) 25 MG tablet Take 1 tablet (25 mg total) by mouth daily.  30 tablet  11    Allergies: No Known Allergies  Social History History  Substance Use Topics  . Smoking status: Never Smoker   . Smokeless tobacco: Never Used  . 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  Has a rash on upper torso; pruritic.  Started using new soap recently.    Vital Signs: BP 108/76  Pulse 62  Ht 6' (1.829 m)  Wt 240 lb (108.863 kg)  BMI 32.55 kg/m2  PHYSICAL EXAM: Well nourished, well developed, in no acute distress HEENT: normal Neck: Thick. Hard to assess JVP. Appears flat.  Cardiac:  normal  S1, S2; RRR; no murmur, no gallops Lungs:  clear to auscultation bilaterally, no wheezing, rhonchi or rales Abd: obese soft, nontender, no hepatomegaly Ext: no edema;  Skin: warm and dry, scattered papular rash on upper chest and shoulders with minimal excoriation; no burrows noted Neuro:  CNs 2-12 intact, no focal abnormalities noted  ASSESSMENT AND PLAN:

## 2011-02-15 NOTE — Patient Instructions (Signed)
Increase Carvedilol to 12.5 mg Twice daily   Your physician recommends that you schedule a follow-up appointment in: 1 month  

## 2011-02-15 NOTE — Assessment & Plan Note (Signed)
Doing better. NYHA I-II. Volume status looks good. We reviewed CPX which is very reassuring. Increase carvedilol to 12.5 bid. Reviewed sliding scale lasix. Return in 1 month. Will re-evaluate EF in 1-2 months to decide about ICD.

## 2011-02-16 ENCOUNTER — Encounter: Payer: Self-pay | Admitting: Pulmonary Disease

## 2011-02-16 ENCOUNTER — Ambulatory Visit (INDEPENDENT_AMBULATORY_CARE_PROVIDER_SITE_OTHER): Payer: Medicaid Other | Admitting: Pulmonary Disease

## 2011-02-16 VITALS — BP 110/60 | HR 89 | Temp 98.5°F | Ht 72.0 in | Wt 247.6 lb

## 2011-02-16 DIAGNOSIS — G4733 Obstructive sleep apnea (adult) (pediatric): Secondary | ICD-10-CM

## 2011-02-16 NOTE — Assessment & Plan Note (Signed)
The patient's history is classic for significant sleep disordered breathing.  He also has an underlying cardiomyopathy which can be greatly affected by this.  I have had a long discussion with the pt about sleep apnea, including its impact on QOL and CV health.  I think he needs to have a sleep study done for diagnosis, and the patient is agreeable.  We'll see him back when the results are available.

## 2011-02-16 NOTE — Progress Notes (Signed)
  Subjective:    Patient ID: Tyrone Walker, male    DOB: 10-Mar-1976, 35 y.o.   MRN: [id number]  HPI The patient is a 35 year old male who been asked to see for possible sleep apnea.  He has a known cardiomyopathy, and the question has been raised whether this may be contributing.  He has a long history of loud snoring, and his bed partner has also noticed an abnormal breathing pattern during sleep.  He has been a restless sleeper for many years, and does not feel rested most of the mornings upon arising.  He notes severe sleepiness and will actually fall asleep if he sits down during the day for any period of time.  He will fall asleep in the evening watching television if he does not take a nap in the afternoon.  He denies any issues with sleepiness while driving.  He states his weight is up over 20 pounds the last 2 years, and his Epworth score today is very abnormal at 16.  Sleep Questionnaire: What time do you typically go to bed?( Between what hours) 10 pm to 4 am (states he has a newborn baby to care for) 10 pm to 4 am (states he has a newborn baby to care for) How long does it take you to fall asleep? 5 to 10 mins How many times during the night do you wake up? What time do you get out of bed to start your day? 0700 Do you drive or operate heavy machinery in your occupation? No How much has your weight changed (up or down) over the past two years? (In pounds) Have you ever had a sleep study before? No Do you currently use CPAP? No Do you wear oxygen at any time? No    Review of Systems  Constitutional: Negative for fever and unexpected weight change.  HENT: Positive for ear pain, congestion and sneezing. Negative for nosebleeds, sore throat, rhinorrhea, trouble swallowing, dental problem, postnasal drip and sinus pressure.   Eyes: Negative for redness and itching.  Respiratory: Positive for shortness of breath. Negative for cough, chest tightness and wheezing.   Cardiovascular: Positive for  palpitations. Negative for leg swelling.  Gastrointestinal: Negative for nausea and vomiting.  Genitourinary: Negative for dysuria.  Musculoskeletal: Positive for joint swelling.  Skin: Negative for rash.  Neurological: Positive for headaches.  Hematological: Does not bruise/bleed easily.  Psychiatric/Behavioral: Negative for dysphoric mood. The patient is not nervous/anxious.        Objective:   Physical Exam Constitutional:  Overweight male, no acute distress  HENT:  Nares patent without discharge, but large turbinates  Oropharynx without exudate, palate and uvula are very long  Eyes:  Perrla, eomi, no scleral icterus  Neck:  No JVD, no TMG  Cardiovascular:  Normal rate, regular rhythm, no rubs or gallops.  No murmurs        Intact distal pulses  Pulmonary :  Normal breath sounds, no stridor or respiratory distress   No rales, rhonchi, or wheezing  Abdominal:  Soft, nondistended, bowel sounds present.  No tenderness noted.   Musculoskeletal:  No lower extremity edema noted.  Lymph Nodes:  No cervical lymphadenopathy noted  Skin:  No cyanosis noted  Neurologic:  Appears sleepy, appropriate, moves all 4 extremities without obvious deficit.         Assessment & Plan:

## 2011-02-16 NOTE — Patient Instructions (Signed)
Will schedule for sleep study, and will see you back once results are available Work on weight loss Try nasonex 2 sprays each nostril each am.

## 2011-03-07 ENCOUNTER — Ambulatory Visit (HOSPITAL_BASED_OUTPATIENT_CLINIC_OR_DEPARTMENT_OTHER): Payer: Medicaid Other | Attending: Pulmonary Disease

## 2011-03-07 DIAGNOSIS — G4733 Obstructive sleep apnea (adult) (pediatric): Secondary | ICD-10-CM | POA: Insufficient documentation

## 2011-03-07 DIAGNOSIS — I4949 Other premature depolarization: Secondary | ICD-10-CM | POA: Insufficient documentation

## 2011-03-19 ENCOUNTER — Telehealth: Payer: Self-pay | Admitting: *Deleted

## 2011-03-19 ENCOUNTER — Ambulatory Visit (HOSPITAL_COMMUNITY)
Admission: RE | Admit: 2011-03-19 | Discharge: 2011-03-19 | Disposition: A | Discharge status: Home / Self Care | Payer: Medicaid Other | Source: Ambulatory Visit | Attending: Internal Medicine | Admitting: Internal Medicine

## 2011-03-19 ENCOUNTER — Encounter (HOSPITAL_COMMUNITY): Payer: Self-pay

## 2011-03-19 DIAGNOSIS — G4733 Obstructive sleep apnea (adult) (pediatric): Secondary | ICD-10-CM

## 2011-03-19 DIAGNOSIS — I5022 Chronic systolic (congestive) heart failure: Secondary | ICD-10-CM

## 2011-03-19 NOTE — Telephone Encounter (Signed)
PT RETURNED CALL. I HAVE SCHEDULED A F/U W/ KC FOR 03/28/11. Hazel Sams

## 2011-03-19 NOTE — Progress Notes (Signed)
History of Present Illness: Primary Cardiologist:  Dr. Arvilla Meres  Tyrone Walker is a 35 y.o. male who was admitted 5/26-11/26/10  with new onset systolic CHF.  He ruled out for MI.  Echo demonstrated EF 20%.  Cath demonstrated normal cors and elevated filling pressures.  He was diuresed and treated on IV Milrinone for low output.  He had serology done to evaluate etiology of his myopathy.  All titers were negative except for Coxsackie B2 titer was elevated.    We saw him in clinic about 2 weeks ago and was c/o NYHA III symptoms. We increased carvedilol to 9.375 and ordered CPX test. Quick look echo 25%  He had CPX 02/05/11: pVO2 23.6 (71%)  Ve/VCO2 27 O2 pulse 80% RER 1.25 VE/MVV 56%  He has taken two extra Lasix in the last week. He is following sliding scale Lasix. He tries to follow low salt diet but does mention eating ham.  SOB with ambulation. No orthopnea or PND.  No syncope.  States compliance with medications. Sleeps on one pillow. He had sleep study last week.      Past Medical History  Diagnosis Date  . history Marijuana smoker   . Elevated LFTs     likely hepatic congestion from CHF  . Colitis 10/12/10    ER visit  . 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 cath 11/22/10: normal cors; EF 15-20%;  b. echo 11/19/10: EF 20%, mild MR, mild LAE, PASP 48; c. dx 5/12, tx with IV milrinone for low output, Coxsackie B2 titer elevated; o/w HIV, CMV, RMSF, ANA, RF, ESR, ACE all normal  . Chronic systolic heart failure     Current Outpatient Prescriptions  Medication Sig Dispense Refill  . acetaminophen (TYLENOL) 325 MG tablet Take 650 mg by mouth every 6 (six) hours as needed.       Marland Kitchen aspirin-acetaminophen-caffeine (EXCEDRIN MIGRAINE) 250-250-65 MG per tablet Take 1 tablet by mouth every 6 (six) hours as needed.        . carvedilol (COREG) 12.5 MG tablet Take 1 tablet (12.5 mg total) by mouth 2 (two) times daily with a meal.  60 tablet  3  . digoxin (LANOXIN) 0.25 MG tablet Take 1  tablet (250 mcg total) by mouth daily.  30 tablet  6  . furosemide (LASIX) 40 MG tablet Take 1 tablet (40 mg total) by mouth daily.  30 tablet  11  . hydrALAZINE (APRESOLINE) 25 MG tablet 1 and 1/2 po q8 hours  135 tablet  3  . isosorbide mononitrate (IMDUR) 30 MG 24 hr tablet Take 30 mg by mouth daily.        Marland Kitchen lisinopril (PRINIVIL,ZESTRIL) 20 MG tablet Take 1 tablet (20 mg total) by mouth 2 (two) times daily.  30 tablet  6  . spironolactone (ALDACTONE) 25 MG tablet Take 1 tablet (25 mg total) by mouth daily.  30 tablet  11    Allergies: No Known Allergies  Social History History  Substance Use Topics  . Smoking status: Never Smoker   . Smokeless tobacco: Never Used  . 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  Has a rash on upper torso; pruritic.  Started using new soap recently.    Vital Signs: There were no vitals taken for this visit.  PHYSICAL EXAM: Well nourished, well developed, in no acute distress HEENT: normal Neck: Thick. Hard to assess JVP. Appears flat.  Cardiac:  normal S1, S2; RRR; no murmur, no gallops Lungs:  clear to auscultation bilaterally, no wheezing, rhonchi or rales Abd: obese soft, nontender, no hepatomegaly Ext: no edema;  Skin: warm and dry, scattered papular rash on upper chest and shoulders with minimal excoriation; no burrows noted Neuro:  CNs 2-12 intact, no focal abnormalities noted  ASSESSMENT AND PLAN:

## 2011-03-19 NOTE — Patient Instructions (Signed)
Please follow up in one month  Please weigh and record daily  Decrease Hydralazine to 37.5 mg twice a day

## 2011-03-19 NOTE — Procedures (Signed)
Tyrone Walker, Tyrone Walker NO.:  [id number]  MEDICAL RECORD NO.:  [id number]          PATIENT TYPE:  OUT  LOCATION:  SLEEP CENTER                 FACILITY:  [facility]  PHYSICIAN:  Barbaraann Share, MD,FCCPDATE OF BIRTH:  09-03-73  DATE OF STUDY:  03/07/2011                           NOCTURNAL POLYSOMNOGRAM  REFERRING PHYSICIAN:  Barbaraann Share, MD,FCCP  INDICATION FOR STUDY:  Hypersomnia with sleep apnea.  EPWORTH SCORE:  9.  SLEEP ARCHITECTURE:  The patient had total sleep time of 298 minutes with no slow wave sleep and only 17 minutes of REM.  Sleep onset latency was mildly prolonged at 33 minutes, and REM onset was 107 minutes. Sleep efficiency was moderately reduced at 74%.  RESPIRATORY DATA:  The patient was found to have 44 apneas and 106 hypopneas, for an apnea/hypopnea index of 30 events per hour.  Events occurred in all body positions and there was loud snoring noted throughout.  OXYGEN DATA:  There was O2 desaturation as low as 88% with the patient's obstructive events.  CARDIAC DATA:  Occasional PVC noted as well as one 3-beat run of ventricular tachycardia.  MOVEMENT/PARASOMNIA:  No significant leg jerks or other abnormal behaviors were seen.  IMPRESSION/RECOMMENDATIONS: 1. Moderate obstructive sleep apnea/hypopnea syndrome with an AHI of     30 events per hour and O2 desaturation as low as 88%.  Treatment     for this degree of sleep apnea can include a trial of weight loss     alone, upper airway surgery, dental appliance, and also CPAP.     Clinical correlation is suggested. 2. Occasional PVC noted as well as one 3-beat run of ventricular     tachycardia.     Barbaraann Share, MD,FCCP Diplomate, American Board of Sleep Medicine Electronically Signed    [facility]  D:  03/19/2011 08:46:52  T:  03/19/2011 09:33:31  Job:  [id number]

## 2011-03-19 NOTE — Telephone Encounter (Signed)
Pt needs ov with kc to discuss sleep study results.  LMOM for pt TCBx1 

## 2011-03-19 NOTE — Assessment & Plan Note (Addendum)
NYHA III. Volume status stable. Headaches noted with increase Hydralazine. Will decrease Hydralazine 37.5 bid. Will follow in one month to titrate medications as tolerated and repeat an ECHO. Reviewed use of daily weights and sliding scale diuretic.   Patient seen and examined with Tonye Becket, NP. We discussed all aspects of the encounter. I agree with the assessment and plan as stated above.

## 2011-03-20 LAB — CBC
HCT: 44.1
Hemoglobin: 15.3
MCHC: 34.8
RDW: 13.1

## 2011-03-20 LAB — URINALYSIS, ROUTINE W REFLEX MICROSCOPIC
Bilirubin Urine: NEGATIVE
Hgb urine dipstick: NEGATIVE
Ketones, ur: NEGATIVE
Nitrite: NEGATIVE
pH: 6.5

## 2011-03-20 LAB — BASIC METABOLIC PANEL
CO2: 29
Glucose, Bld: 116 — ABNORMAL HIGH
Potassium: 4.1
Sodium: 140

## 2011-03-20 LAB — DIFFERENTIAL
Basophils Absolute: 0
Basophils Relative: 0
Eosinophils Relative: 1
Lymphocytes Relative: 12
Monocytes Absolute: 0.7

## 2011-03-27 ENCOUNTER — Ambulatory Visit (INDEPENDENT_AMBULATORY_CARE_PROVIDER_SITE_OTHER): Payer: Medicaid Other | Admitting: Pulmonary Disease

## 2011-03-27 ENCOUNTER — Encounter: Payer: Self-pay | Admitting: Pulmonary Disease

## 2011-03-27 VITALS — BP 118/72 | HR 85 | Temp 98.4°F | Ht 73.0 in | Wt 243.8 lb

## 2011-03-27 DIAGNOSIS — G4733 Obstructive sleep apnea (adult) (pediatric): Secondary | ICD-10-CM

## 2011-03-27 NOTE — Assessment & Plan Note (Signed)
The patient has been noted to have moderate sleep apnea by his recent sleep study, and also has an underlying cardiomyopathy.  I have discussed various treatment options with him, including weight loss, dental appliance, and CPAP.  Given his underlying cardiac disease, I have strongly recommended treating his sleep apnea with CPAP while he is working on weight reduction.  He is agreeable to this approach. I will set the patient up on cpap at a moderate pressure level to allow for desensitization, and will troubleshoot the device over the next 4-6weeks if needed.  The pt is to call me if having issues with tolerance.  Will then optimize the pressure once patient is able to wear cpap on a consistent basis.

## 2011-03-27 NOTE — Progress Notes (Signed)
  Subjective:    Patient ID: Tyrone Walker, male    DOB: 1975/09/12, 35 y.o.   MRN: [id number]  HPI The patient comes in today for followup of his recent sleep study.  He was found to have moderate OSA with an AHI of 30 events per hour.  I have reviewed the study with him in detail, and answered all of his questions.   Review of Systems  Constitutional: Negative for fever and unexpected weight change.  HENT: Positive for ear pain, congestion, rhinorrhea, sneezing and sinus pressure. Negative for nosebleeds, sore throat, trouble swallowing, dental problem and postnasal drip.   Eyes: Negative for redness and itching.  Respiratory: Positive for cough, chest tightness and shortness of breath. Negative for wheezing.   Cardiovascular: Negative for palpitations and leg swelling.  Gastrointestinal: Positive for nausea. Negative for vomiting.  Genitourinary: Negative for dysuria.  Musculoskeletal: Negative for joint swelling.  Skin: Negative for rash.  Neurological: Positive for headaches.  Hematological: Does not bruise/bleed easily.  Psychiatric/Behavioral: Negative for dysphoric mood. The patient is not nervous/anxious.        Objective:   Physical Exam Overweight male in no acute distress Nose without purulent discharge noted Chest is clear to auscultation Lower extremities without edema. Alert and oriented, moves all 4 extremities.       Assessment & Plan:

## 2011-03-27 NOTE — Patient Instructions (Signed)
Will start on cpap.  Please call if having tolerance issues. Work on weight loss followup with me in 5 weeks.  

## 2011-04-22 NOTE — Progress Notes (Signed)
Patient seen and examined with Amy Clegg, NP. We discussed all aspects of the encounter. I agree with the assessment and plan as stated above.   

## 2011-04-24 ENCOUNTER — Telehealth: Payer: Self-pay | Admitting: Pulmonary Disease

## 2011-04-24 ENCOUNTER — Ambulatory Visit (HOSPITAL_COMMUNITY)
Admission: RE | Admit: 2011-04-24 | Discharge: 2011-04-24 | Disposition: A | Discharge status: Home / Self Care | Payer: Medicaid Other | Source: Ambulatory Visit | Attending: Internal Medicine | Admitting: Internal Medicine

## 2011-04-24 ENCOUNTER — Other Ambulatory Visit: Payer: Self-pay | Admitting: Pulmonary Disease

## 2011-04-24 VITALS — BP 130/84 | HR 87 | Wt 244.2 lb

## 2011-04-24 DIAGNOSIS — G4733 Obstructive sleep apnea (adult) (pediatric): Secondary | ICD-10-CM

## 2011-04-24 DIAGNOSIS — I5022 Chronic systolic (congestive) heart failure: Secondary | ICD-10-CM | POA: Insufficient documentation

## 2011-04-24 DIAGNOSIS — I059 Rheumatic mitral valve disease, unspecified: Secondary | ICD-10-CM

## 2011-04-24 MED ORDER — CARVEDILOL 12.5 MG PO TABS: 18.7500 mg | ORAL_TABLET | Freq: Two times a day (BID) | ORAL | Status: DC

## 2011-04-24 NOTE — Progress Notes (Signed)
History of Present Illness: Primary Cardiologist:  Dr. Arvilla Meres  Tyrone Walker is a 35 y.o. male who was admitted 5/26-11/26/10  with new onset systolic CHF.  He ruled out for MI.  Echo demonstrated EF 20%.  Cath demonstrated normal cors and elevated filling pressures.  He was diuresed and treated on IV Milrinone for low output.  He had serology done to evaluate etiology of his myopathy.  All titers were negative except for Coxsackie B2 titer was elevated.    We saw him in clinic about 2 weeks ago and was c/o NYHA III symptoms. We increased carvedilol to 9.375 and ordered CPX test. Quick look echo 25%  He had CPX 02/05/11: pVO2 23.6 (71%)  Ve/VCO2 27 O2 pulse 80% RER 1.25 VE/MVV 56%  Echo today (10/30): reviewed by Dr. Gala Romney LVEF 40-45%, final report pending.   Returns for f/u today.  Sometimes he feels that his heart is struggling to pump until he takes his medication this has happened a lot more over the last two weeks.  Has not needed any extra lasix.  Walking 45 min 4x week.  Has to stop if walking up a hill but able to make it up the hill.  Dr. Shelle Iron started CPAP.  He hates it so not using it correctly.  Compliant with medications but is taking am doses later in the morning.  He is doing more over the last two weeks.  No syncope/dizziness.  HA improved after decrease in hydralazine.       Past Medical History  Diagnosis Date  . history Marijuana smoker   . Elevated LFTs     likely hepatic congestion from CHF  . Colitis 10/12/10    ER visit  . 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 cath 11/22/10: normal cors; EF 15-20%;  b. echo 11/19/10: EF 20%, mild MR, mild LAE, PASP 48; c. dx 5/12, tx with IV milrinone for low output, Coxsackie B2 titer elevated; o/w HIV, CMV, RMSF, ANA, RF, ESR, ACE all normal  . Chronic systolic heart failure     Current Outpatient Prescriptions  Medication Sig Dispense Refill  . acetaminophen (TYLENOL) 325 MG tablet Take 650 mg by mouth every 6  (six) hours as needed.       Marland Kitchen aspirin-acetaminophen-caffeine (EXCEDRIN MIGRAINE) 250-250-65 MG per tablet Take 1 tablet by mouth every 6 (six) hours as needed.        . carvedilol (COREG) 12.5 MG tablet Take 1 tablet (12.5 mg total) by mouth 2 (two) times daily with a meal.  60 tablet  3  . digoxin (LANOXIN) 0.25 MG tablet Take 1 tablet (250 mcg total) by mouth daily.  30 tablet  6  . furosemide (LASIX) 40 MG tablet Take 1 tablet (40 mg total) by mouth daily.  30 tablet  11  . hydrALAZINE (APRESOLINE) 25 MG tablet Take 37.5 mg by mouth 2 (two) times daily.       . isosorbide mononitrate (IMDUR) 30 MG 24 hr tablet Take 30 mg by mouth daily.        Marland Kitchen lisinopril (PRINIVIL,ZESTRIL) 20 MG tablet Take 1 tablet (20 mg total) by mouth 2 (two) times daily.  30 tablet  6  . spironolactone (ALDACTONE) 25 MG tablet Take 1 tablet (25 mg total) by mouth daily.  30 tablet  11    Allergies: No Known Allergies  Social History History  Substance Use Topics  . Smoking status: Never Smoker   . Smokeless tobacco: Never Used  .  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  Has a rash on upper torso; pruritic.  Started using new soap recently.    Vital Signs: BP 130/84  Pulse 87  Wt 244 lb 4 oz (110.791 kg)  SpO2 98% Wt 243 on 10/2  PHYSICAL EXAM: Well nourished, well developed, in no acute distress HEENT: normal Neck: Thick. Hard to assess JVP. Appears flat.  Cardiac:  normal S1, S2; RRR; no murmur, no gallops Lungs:  clear to auscultation bilaterally, no wheezing, rhonchi or rales Abd: obese soft, nontender, no hepatomegaly Ext: no edema;  Skin: warm and dry, scattered papular rash on upper chest and shoulders with minimal excoriation; no burrows noted Neuro:  CNs 2-12 intact, no focal abnormalities noted  ASSESSMENT AND PLAN:

## 2011-04-24 NOTE — Assessment & Plan Note (Addendum)
Doing well.  NYHA I-II.  Volume status looks good.  Echo reviewed in clinic and EF improving, 40-45%.  Will increase carvedilol to 18.75 bid. Reviewed sliding scale lasix. Return in 1 month.   Patient seen and examined with Ulyess Blossom PA-C. We discussed all aspects of the encounter. I agree with the assessment and plan as stated above.   Addendum: Echo read as EF 30-35%. Will review with colleagues. May need cardiac MRI to better quantify and guide decision making re: ICD.

## 2011-04-24 NOTE — Telephone Encounter (Signed)
I spoke with the pt and he states he feels his cpap is blowing too much air in his face. He states the air is blowing so strong that it "blows his mouth open." He states this will wake him up during the night. He states he is not able to sleep with the air this strong. He is taking off his mask during the night to get some relief. Pt has a full face mask.  Please advise. Carron Curie, CMA

## 2011-04-24 NOTE — Patient Instructions (Signed)
Increase Coreg 1.5 tablets (18.75 mg) twice daily.    Call with questions.  Follow up with Dr. Gala Romney in 4 weeks.

## 2011-04-24 NOTE — Telephone Encounter (Signed)
Pt advised order sent to [facility] to change to auto. He is aware to bring in his machine at next visit.Carron Curie, CMA

## 2011-04-24 NOTE — Telephone Encounter (Signed)
LMTCB

## 2011-04-24 NOTE — Telephone Encounter (Signed)
Let him know the pressure I started him on is actually a low pressure.  Suspect he may need more than that. Can try him on the auto setting on his machine to see if more comfortable for him. Will send order to [facility], but he needs to bring his machine to the upcoming f/u visit with me so we can download it.

## 2011-04-25 NOTE — Progress Notes (Signed)
Patient seen and examined with Nicki Bradley PA-C. We discussed all aspects of the encounter. I agree with the assessment and plan as stated above.   

## 2011-05-01 ENCOUNTER — Ambulatory Visit (INDEPENDENT_AMBULATORY_CARE_PROVIDER_SITE_OTHER): Payer: Medicaid Other | Admitting: Pulmonary Disease

## 2011-05-01 ENCOUNTER — Encounter: Payer: Self-pay | Admitting: Pulmonary Disease

## 2011-05-01 VITALS — BP 150/90 | HR 84 | Temp 98.2°F | Ht 72.0 in | Wt 250.4 lb

## 2011-05-01 DIAGNOSIS — G4733 Obstructive sleep apnea (adult) (pediatric): Secondary | ICD-10-CM

## 2011-05-01 NOTE — Assessment & Plan Note (Signed)
The patient has had poor tolerance of set CPAP, and unfortunately his DME has not made the appropriate changes to his machine despite [place] sending in order last week.  We'll have his device set on automatic mode today, and I have also instructed him on how to use the heated humidification system to improve nasal dryness.  If he continues to have issues with pressure tolerance, will change him over to bilevel.  I have also encouraged patient to work aggressively on weight loss.

## 2011-05-01 NOTE — Progress Notes (Signed)
  Subjective:    Patient ID: Tyrone Walker, male    DOB: 21-Dec-1975, 35 y.o.   MRN: [id number]  HPI The patient comes in today for followup of his obstructive sleep apnea.  He has been started on CPAP, but has had poor tolerance due to the pressure.  In order had been sent to his medical device company to change to the automatic mode, however this has not been done.  The patient states when he can wear the device consistently he sees a big improvement in his sleep and daytime alertness.  He is having no issues with his fullface mask.  His only other complaint today is related to nasal dryness, and he was unaware that he can increase the heater settings in order to provide more moisture.   Review of Systems  Constitutional: Positive for fever, chills and diaphoresis. Negative for unexpected weight change.  HENT: Positive for sinus pressure. Negative for ear pain, nosebleeds, congestion, sore throat, rhinorrhea, sneezing, trouble swallowing, dental problem and postnasal drip.   Eyes: Positive for redness and itching.  Respiratory: Negative for cough, chest tightness, shortness of breath and wheezing.   Cardiovascular: Negative for palpitations and leg swelling.  Gastrointestinal: Negative for nausea and vomiting.  Genitourinary: Negative for dysuria.  Musculoskeletal: Negative for joint swelling.  Skin: Negative for rash.  Neurological: Positive for headaches.  Hematological: Does not bruise/bleed easily.  Psychiatric/Behavioral: Negative for dysphoric mood. The patient is not nervous/anxious.        Objective:   Physical Exam Overweight male in no acute distress No skin breakdown or pressure necrosis from the CPAP mask Chest clear to auscultation Cardiac with controlled ventricular response Lower extremities without edema, no cyanosis noted Alert and oriented, moves all 4 extremities.       Assessment & Plan:

## 2011-05-01 NOTE — Patient Instructions (Signed)
Will have machine changed to auto today Increase heat on humidifier to get more moisture Work on weight loss Please call me in 2-3 weeks with response to the changes.  Will then discuss further followup.

## 2011-05-29 ENCOUNTER — Ambulatory Visit (HOSPITAL_COMMUNITY)
Admission: RE | Admit: 2011-05-29 | Discharge: 2011-05-29 | Disposition: A | Discharge status: Home / Self Care | Payer: Medicaid Other | Source: Ambulatory Visit | Attending: Internal Medicine | Admitting: Internal Medicine

## 2011-05-29 ENCOUNTER — Encounter (HOSPITAL_COMMUNITY): Payer: Self-pay

## 2011-05-29 VITALS — BP 108/62 | HR 72 | Wt 241.1 lb

## 2011-05-29 DIAGNOSIS — I5022 Chronic systolic (congestive) heart failure: Secondary | ICD-10-CM | POA: Insufficient documentation

## 2011-05-29 LAB — BASIC METABOLIC PANEL
BUN: 12 mg/dL (ref 6–23)
CO2: 26 mEq/L (ref 19–32)
GFR calc non Af Amer: 87 mL/min — ABNORMAL LOW (ref >90)
Glucose, Bld: 175 mg/dL — ABNORMAL HIGH (ref 70–99)
Potassium: 3.9 mEq/L (ref 3.5–5.1)

## 2011-05-29 NOTE — Patient Instructions (Signed)
Your physician recommends that you schedule a follow-up appointment in: 6 weeks  

## 2011-05-29 NOTE — Progress Notes (Signed)
Encounter addended by: Noralee Space, RN on: 05/29/2011 12:44 PM<BR>     Documentation filed: Patient Instructions Section

## 2011-05-29 NOTE — Progress Notes (Signed)
History of Present Illness: Primary Cardiologist:  Dr. Arvilla Meres  Tyrone Walker is a 35 y.o. male who was admitted 5/26-11/26/10  with new onset systolic CHF.  He ruled out for MI.  Echo demonstrated EF 20%.  Cath demonstrated normal cors and elevated filling pressures.  He was diuresed and treated on IV Milrinone for low output.  He had serology done to evaluate etiology of his myopathy.  All titers were negative except for Coxsackie B2 titer was elevated.    He had CPX 02/05/11: pVO2 23.6 (71%)  Ve/VCO2 27 O2 pulse 80% RER 1.25 VE/MVV 56%  We saw him last 10/30. Echo which I reviewed preliminary I felt EF 40% range. Formal read says 30-35%   At last visit carvedilol increased to 18.75 bid. Since that time has increased fatigue after taking his meds. However overall doing much better than before.   Gained some weight over Thanksgiving but doubled lasix and now weight back down. Still walking everyday. Recently walked 5 miles in one shots. No orthopnea, PND, edema. Trying to use CPAP but having problem with sinuses.    Past Medical History  Diagnosis Date  . history Marijuana smoker   . Elevated LFTs     likely hepatic congestion from CHF  . Colitis 10/12/10    ER visit  . 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 cath 11/22/10: normal cors; EF 15-20%;  b. echo 11/19/10: EF 20%, mild MR, mild LAE, PASP 48; c. dx 5/12, tx with IV milrinone for low output, Coxsackie B2 titer elevated; o/w HIV, CMV, RMSF, ANA, RF, ESR, ACE all normal  . Chronic systolic heart failure     Current Outpatient Prescriptions  Medication Sig Dispense Refill  . acetaminophen (TYLENOL) 325 MG tablet Take 650 mg by mouth every 6 (six) hours as needed.       Marland Kitchen aspirin-acetaminophen-caffeine (EXCEDRIN MIGRAINE) 250-250-65 MG per tablet Take 1 tablet by mouth every 6 (six) hours as needed.        . carvedilol (COREG) 6.25 MG tablet Take 3 tabs by mouth twice daily .       . digoxin (LANOXIN) 0.25 MG tablet Take 1  tablet (250 mcg total) by mouth daily.  30 tablet  6  . furosemide (LASIX) 40 MG tablet Take 1 tablet (40 mg total) by mouth daily.  30 tablet  11  . hydrALAZINE (APRESOLINE) 25 MG tablet Take 37.5 mg by mouth 2 (two) times daily.       . isosorbide mononitrate (IMDUR) 30 MG 24 hr tablet Take 30 mg by mouth daily.        Marland Kitchen lisinopril (PRINIVIL,ZESTRIL) 20 MG tablet Take 1 tablet (20 mg total) by mouth 2 (two) times daily.  30 tablet  6  . spironolactone (ALDACTONE) 25 MG tablet Take 1 tablet (25 mg total) by mouth daily.  30 tablet  11    Allergies: No Known Allergies  Social History History  Substance Use Topics  . Smoking status: Never Smoker   . Smokeless tobacco: Never Used  . 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  Has a rash on upper torso; pruritic.  Started using new soap recently.    Vital Signs: Filed Vitals:   05/29/11 1225  BP: 108/62  Pulse: 72  Wt 241 (stable)   PHYSICAL EXAM: Well nourished, well developed, in no acute distress HEENT: normal Neck: Thick. JVP flat Cardiac:  normal S1, S2; RRR; no murmur, no gallops Lungs:  clear to auscultation bilaterally, no wheezing, rhonchi or rales Abd: obese soft, nontender, no hepatomegaly Ext: no edema;  Skin: warm and dry, scattered papular rash on upper chest and shoulders with minimal excoriation; no burrows noted Neuro:  CNs 2-12 intact, no focal abnormalities noted  ASSESSMENT AND PLAN:

## 2011-05-29 NOTE — Progress Notes (Signed)
Encounter addended by: Noralee Space, RN on: 05/29/2011 12:52 PM<BR>     Documentation filed: Orders

## 2011-05-29 NOTE — Assessment & Plan Note (Signed)
Doing very well. NYHA I. Volume status looks good. Having some fatigue on higher dose carvedilol. Will keep doses where they are for now. EF at borderline for ICD but appears to be getting better will continue to follow and repeat echo in 3 months to decide about ICD. Congratulated him on use of sliding scale lasix. Blod work today.

## 2011-05-31 ENCOUNTER — Encounter (HOSPITAL_COMMUNITY): Payer: Medicaid Other

## 2011-06-28 ENCOUNTER — Telehealth: Payer: Self-pay | Admitting: *Deleted

## 2011-06-28 NOTE — Telephone Encounter (Signed)
Phone call received from patient late yesterday afternoon reporting nasal congestion and stuffiness,wanted to know what he can take OTC. Has some green nasal secretions mixed with a little blood.   No cough or fever. Consulted with Dr. McDiarmid and he advises make take Benadryl and can also use Afrin nasal spray.

## 2011-07-10 ENCOUNTER — Ambulatory Visit (HOSPITAL_COMMUNITY)
Admission: RE | Admit: 2011-07-10 | Discharge: 2011-07-10 | Disposition: A | Discharge status: Home / Self Care | Payer: Medicaid Other | Source: Ambulatory Visit | Attending: Internal Medicine | Admitting: Internal Medicine

## 2011-07-10 ENCOUNTER — Encounter (HOSPITAL_COMMUNITY): Payer: Self-pay

## 2011-07-10 VITALS — BP 120/82 | HR 95 | Wt 235.8 lb

## 2011-07-10 DIAGNOSIS — I5022 Chronic systolic (congestive) heart failure: Secondary | ICD-10-CM | POA: Insufficient documentation

## 2011-07-10 DIAGNOSIS — J019 Acute sinusitis, unspecified: Secondary | ICD-10-CM | POA: Insufficient documentation

## 2011-07-10 LAB — BASIC METABOLIC PANEL
BUN: 14 mg/dL (ref 6–23)
CO2: 30 mEq/L (ref 19–32)
Chloride: 101 mEq/L (ref 96–112)
Creatinine, Ser: 1.05 mg/dL (ref 0.50–1.35)
GFR calc Af Amer: >90 mL/min (ref >90)
Glucose, Bld: 130 mg/dL — ABNORMAL HIGH (ref 70–99)

## 2011-07-10 MED ORDER — SULFAMETHOXAZOLE-TRIMETHOPRIM 800-160 MG PO TABS: 1.0000 | ORAL_TABLET | Freq: Two times a day (BID) | ORAL | Status: AC

## 2011-07-10 NOTE — Patient Instructions (Signed)
Labs today  Your physician has requested that you have an echocardiogram. Echocardiography is a painless test that uses sound waves to create images of your heart. It provides your doctor with information about the size and shape of your heart and how well your heart's chambers and valves are working. This procedure takes approximately one hour. There are no restrictions for this procedure.  IN 2 WEEKS  Your physician recommends that you schedule a follow-up appointment in: 2 months.  Do the following things EVERYDAY: 1) Weigh yourself in the morning before breakfast. Write it down and keep it in a log. 2) Take your medicines as prescribed 3) Eat low salt foods-Limit salt (sodium) to 2000mg  per day.  4) Stay as active as you can everyday

## 2011-07-10 NOTE — Assessment & Plan Note (Signed)
Has had prolonged symptoms. Treat with 7 days of bactrim for good sinus penetration. Contniue nasal sprays.

## 2011-07-10 NOTE — Assessment & Plan Note (Signed)
Doing very well NYHA I-II. Weight stable. Will keep meds at current dosing. Check echo in 2 weeks. Check blood work

## 2011-07-10 NOTE — Progress Notes (Signed)
History of Present Illness: Primary Cardiologist:  Dr. Arvilla Meres  Tyrone Walker is a 36 y.o. male who was admitted 5/26-11/26/10  with new onset systolic CHF.  He ruled out for MI.  Echo demonstrated EF 20%.  Cath demonstrated normal cors and elevated filling pressures.  He was diuresed and treated on IV Milrinone for low output.  He had serology done to evaluate etiology of his myopathy.  All titers were negative except for Coxsackie B2 titer was elevated.    He had CPX 02/05/11: pVO2 23.6 (71%)  Ve/VCO2 27 O2 pulse 80% RER 1.25 VE/MVV 56%  We saw him last 05/2011. Echo from 10/12 which I reviewed preliminary I felt EF 40% range. Formal read says 30-35%   At last visit carvedilol not increased due to fatigue. Decided to wait on ICD as EF seemed to be getting better. Plan to recheck in 3 months at that time.   Says he is doing great from HF perspective however has had bad sinusitis for 2 weeks. + f/c/productive cough with green sputum. + teeth pain. Denies orthopnea, PND, edema. Can do anything he wants to do except he notes he feels SOB when carrying his 38 month old daughter. . Weight down 6 pounds. Struggling to use CPAP.     Past Medical History  Diagnosis Date  . history Marijuana smoker   . Elevated LFTs     likely hepatic congestion from CHF  . Colitis 10/12/10    ER visit  . 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 cath 11/22/10: normal cors; EF 15-20%;  b. echo 11/19/10: EF 20%, mild MR, mild LAE, PASP 48; c. dx 5/12, tx with IV milrinone for low output, Coxsackie B2 titer elevated; o/w HIV, CMV, RMSF, ANA, RF, ESR, ACE all normal  . Chronic systolic heart failure     Current Outpatient Prescriptions  Medication Sig Dispense Refill  . acetaminophen (TYLENOL) 325 MG tablet Take 650 mg by mouth every 6 (six) hours as needed.       Marland Kitchen aspirin-acetaminophen-caffeine (EXCEDRIN MIGRAINE) 250-250-65 MG per tablet Take 1 tablet by mouth every 6 (six) hours as needed.        .  carvedilol (COREG) 6.25 MG tablet Take 3 tabs by mouth twice daily .       . digoxin (LANOXIN) 0.25 MG tablet Take 1 tablet (250 mcg total) by mouth daily.  30 tablet  6  . furosemide (LASIX) 40 MG tablet Take 1 tablet (40 mg total) by mouth daily.  30 tablet  11  . hydrALAZINE (APRESOLINE) 25 MG tablet Take 37.5 mg by mouth 2 (two) times daily.       . isosorbide mononitrate (IMDUR) 30 MG 24 hr tablet Take 30 mg by mouth daily.        Marland Kitchen lisinopril (PRINIVIL,ZESTRIL) 20 MG tablet Take 1 tablet (20 mg total) by mouth 2 (two) times daily.  30 tablet  6  . spironolactone (ALDACTONE) 25 MG tablet Take 1 tablet (25 mg total) by mouth daily.  30 tablet  11    Allergies: No Known Allergies  Social History History  Substance Use Topics  . Smoking status: Never Smoker   . Smokeless tobacco: Never Used  . 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  Has a rash on upper torso; pruritic.  Started using new soap recently.    Vital Signs: Filed Vitals:   07/10/11  0848  BP: 120/82  Pulse: 95  Wt 235 (241)   PHYSICAL EXAM: Well nourished, well developed, in no acute distress HEENT: normal Neck: Thick. JVP flat Cardiac:  normal S1, S2; RRR; no murmur, no gallops Lungs:  clear to auscultation bilaterally, no wheezing, rhonchi or rales Abd: obese soft, nontender, no hepatomegaly Ext: no edema;  Skin: warm and dry, scattered papular rash on upper chest and shoulders with minimal excoriation; no burrows noted Neuro:  CNs 2-12 intact, no focal abnormalities noted  ASSESSMENT AND PLAN:

## 2011-07-24 ENCOUNTER — Ambulatory Visit (HOSPITAL_COMMUNITY)
Admission: RE | Admit: 2011-07-24 | Discharge: 2011-07-24 | Disposition: A | Discharge status: Home / Self Care | Payer: Medicaid Other | Source: Ambulatory Visit | Attending: Internal Medicine | Admitting: Internal Medicine

## 2011-07-24 ENCOUNTER — Ambulatory Visit (HOSPITAL_COMMUNITY): Payer: Medicaid Other

## 2011-07-24 DIAGNOSIS — I428 Other cardiomyopathies: Secondary | ICD-10-CM | POA: Insufficient documentation

## 2011-07-24 DIAGNOSIS — I517 Cardiomegaly: Secondary | ICD-10-CM

## 2011-07-24 DIAGNOSIS — I5022 Chronic systolic (congestive) heart failure: Secondary | ICD-10-CM

## 2011-07-24 DIAGNOSIS — I509 Heart failure, unspecified: Secondary | ICD-10-CM | POA: Insufficient documentation

## 2011-07-24 DIAGNOSIS — I079 Rheumatic tricuspid valve disease, unspecified: Secondary | ICD-10-CM | POA: Insufficient documentation

## 2011-07-24 DIAGNOSIS — R0602 Shortness of breath: Secondary | ICD-10-CM | POA: Insufficient documentation

## 2011-07-24 NOTE — Progress Notes (Signed)
*  PRELIMINARY RESULTS* Echocardiogram 2D Echocardiogram has been performed.  Clide Deutscher 07/24/2011, 1:38 PM

## 2011-09-06 ENCOUNTER — Ambulatory Visit (HOSPITAL_COMMUNITY): Payer: Medicaid Other

## 2011-09-24 ENCOUNTER — Encounter: Payer: Self-pay | Admitting: Family Medicine

## 2011-09-25 ENCOUNTER — Ambulatory Visit (HOSPITAL_COMMUNITY): Admission: RE | Admit: 2011-09-25 | Payer: Medicaid Other | Source: Ambulatory Visit

## 2011-10-03 ENCOUNTER — Ambulatory Visit: Payer: Medicaid Other | Admitting: Family Medicine

## 2011-10-05 ENCOUNTER — Telehealth: Payer: Self-pay | Admitting: Pulmonary Disease

## 2011-10-05 NOTE — Telephone Encounter (Signed)
Spoke with pt- he is c/o SOB, sneezing and nasal congestion. I have advised since [facility] only sees him for OSA needs to discuss these issues with his PCP. Pt verbalized understanding and states nothing further needed.

## 2011-10-09 ENCOUNTER — Encounter: Payer: Self-pay | Admitting: Family Medicine

## 2011-10-09 ENCOUNTER — Ambulatory Visit (INDEPENDENT_AMBULATORY_CARE_PROVIDER_SITE_OTHER): Payer: Medicaid Other | Admitting: Family Medicine

## 2011-10-09 VITALS — BP 136/96 | HR 92 | Temp 98.6°F | Ht 72.0 in | Wt 241.0 lb

## 2011-10-09 DIAGNOSIS — J309 Allergic rhinitis, unspecified: Secondary | ICD-10-CM

## 2011-10-09 DIAGNOSIS — H6690 Otitis media, unspecified, unspecified ear: Secondary | ICD-10-CM

## 2011-10-09 MED ORDER — CETIRIZINE HCL 10 MG PO TABS: 10.0000 mg | ORAL_TABLET | Freq: Every day | ORAL | Status: DC

## 2011-10-09 MED ORDER — FLUTICASONE PROPIONATE 50 MCG/ACT NA SUSP: 2.0000 | Freq: Every day | NASAL | Status: DC

## 2011-10-09 MED ORDER — AMOXICILLIN 500 MG PO CAPS: 500.0000 mg | ORAL_CAPSULE | Freq: Three times a day (TID) | ORAL | Status: AC

## 2011-10-09 NOTE — Assessment & Plan Note (Signed)
Will place on course on amoxicillin for this.

## 2011-10-09 NOTE — Patient Instructions (Signed)
Allergic Rhinitis  Allergic rhinitis is when the mucous membranes in the nose respond to allergens. Allergens are particles in the air that cause your body to have an allergic reaction. This causes you to release allergic antibodies. Through a chain of events, these eventually cause you to release histamine into the blood stream (hence the use of antihistamines). Although meant to be protective to the body, it is this release that causes your discomfort, such as frequent sneezing, congestion and an itchy runny nose.    CAUSES    The pollen allergens may come from grasses, trees, and weeds. This is seasonal allergic rhinitis, or "hay fever." Other allergens cause year-round allergic rhinitis (perennial allergic rhinitis) such as house dust mite allergen, pet dander and mold spores.    SYMPTOMS     Nasal stuffiness (congestion).   Runny, itchy nose with sneezing and tearing of the eyes.   There is often an itching of the mouth, eyes and ears.  It cannot be cured, but it can be controlled with medications.  DIAGNOSIS    If you are unable to determine the offending allergen, skin or blood testing may find it.  TREATMENT     Avoid the allergen.   Medications and allergy shots (immunotherapy) can help.   Hay fever may often be treated with antihistamines in pill or nasal spray forms. Antihistamines block the effects of histamine. There are over-the-counter medicines that may help with nasal congestion and swelling around the eyes. Check with your caregiver before taking or giving this medicine.  If the treatment above does not work, there are many new medications your caregiver can prescribe. Stronger medications may be used if initial measures are ineffective. Desensitizing injections can be used if medications and avoidance fails. Desensitization is when a patient is given ongoing shots until the body becomes less sensitive to the allergen. Make sure you follow up with your caregiver if problems continue.  SEEK  MEDICAL CARE IF:     You develop fever (more than 100.5 F (38.1 C).   You develop a cough that does not stop easily (persistent).   You have shortness of breath.   You start wheezing.   Symptoms interfere with normal daily activities.  Document Released: 03/06/2001 Document Revised: 05/31/2011 Document Reviewed: 09/15/2008  [employer] Patient Information 2012 [employer].

## 2011-10-09 NOTE — Progress Notes (Signed)
  Subjective:    Patient ID: Tyrone Walker, male    DOB: 1976-04-16, 36 y.o.   MRN: [id number]  HPI Patient is [place] today with chief complaint of nasal congestion and allergic symptoms. Patient states his symptoms have been present since onset of spring and spring pollination. Patient reports symptoms of nasal congestion, rhinorrhea, postnasal drip, itchy eyes scratchy throat. Patient also has intermittent ear congestion. Pt has had some blood flecked mucus with nose blowing. Mild cough. Patient denies any wheezing or shortness of breath. Patient has a baseline history of heart failure. Patient states this is been overall stable with no reports of worsening orthopnea or increased lotion the swelling. Patient states he was placed on a short course of Bactrim for this approximately 3 months ago by his cardiologist Dr. Daneil Dolin. Patient states mildly effective in helping his symptoms. Patient states he has not tried any medication for this. Pt states that this occurs on a yearly basis.    Review of Systems See HPI, otherwise ROS negative     Objective:   Physical Exam Gen: up in chair, NAD HEENT: NCAT, EOMI, TM bulging and erythema R >L, +nasal erythema, rhinorrhea bilaterally, + post oropharyngeal erythema  CV: RRR, no murmurs auscultated PULM: CTAB, no wheezes, rales, rhoncii ABD: S/NT/+ bowel sounds  EXT: 2+ peripheral pulses, no edema noted     Assessment & Plan:

## 2011-10-09 NOTE — Assessment & Plan Note (Signed)
Will start pt on zyrtec and flonase for this. Discussed allergic avoidance while trees are actively blooming. Handout given. Discussed infectious and cardiorespiratory red flags.

## 2011-10-17 ENCOUNTER — Ambulatory Visit: Payer: Medicaid Other | Admitting: Pulmonary Disease

## 2011-10-18 ENCOUNTER — Ambulatory Visit (INDEPENDENT_AMBULATORY_CARE_PROVIDER_SITE_OTHER): Payer: Medicaid Other | Admitting: Family Medicine

## 2011-10-18 ENCOUNTER — Encounter: Payer: Self-pay | Admitting: Family Medicine

## 2011-10-18 VITALS — BP 140/98 | HR 80 | Temp 99.0°F | Ht 72.0 in | Wt 242.4 lb

## 2011-10-18 DIAGNOSIS — R531 Weakness: Secondary | ICD-10-CM

## 2011-10-18 DIAGNOSIS — L301 Dyshidrosis [pompholyx]: Secondary | ICD-10-CM

## 2011-10-18 DIAGNOSIS — B078 Other viral warts: Secondary | ICD-10-CM

## 2011-10-18 DIAGNOSIS — B081 Molluscum contagiosum: Secondary | ICD-10-CM

## 2011-10-18 DIAGNOSIS — I10 Essential (primary) hypertension: Secondary | ICD-10-CM | POA: Insufficient documentation

## 2011-10-18 DIAGNOSIS — B079 Viral wart, unspecified: Secondary | ICD-10-CM

## 2011-10-18 DIAGNOSIS — E119 Type 2 diabetes mellitus without complications: Secondary | ICD-10-CM

## 2011-10-18 DIAGNOSIS — I5022 Chronic systolic (congestive) heart failure: Secondary | ICD-10-CM

## 2011-10-18 DIAGNOSIS — M6281 Muscle weakness (generalized): Secondary | ICD-10-CM

## 2011-10-18 HISTORY — DX: Essential (primary) hypertension: I10

## 2011-10-18 LAB — COMPREHENSIVE METABOLIC PANEL
Albumin: 4.7 g/dL (ref 3.5–5.2)
BUN: 11 mg/dL (ref 6–23)
CO2: 30 mEq/L (ref 19–32)
Calcium: 9.1 mg/dL (ref 8.4–10.5)
Chloride: 103 mEq/L (ref 96–112)
Glucose, Bld: 106 mg/dL — ABNORMAL HIGH (ref 70–99)
Potassium: 4.1 mEq/L (ref 3.5–5.3)
Sodium: 141 mEq/L (ref 135–145)
Total Protein: 7.2 g/dL (ref 6.0–8.3)

## 2011-10-18 LAB — LIPID PANEL
Cholesterol: 125 mg/dL (ref 0–200)
Triglycerides: 152 mg/dL — ABNORMAL HIGH (ref <150)

## 2011-10-18 MED ORDER — ASPIRIN EC 81 MG PO TBEC: 81.0000 mg | DELAYED_RELEASE_TABLET | Freq: Every day | ORAL | Status: AC

## 2011-10-18 MED ORDER — CLOBETASOL PROPIONATE 0.05 % EX CREA: TOPICAL_CREAM | Freq: Two times a day (BID) | CUTANEOUS | Status: AC

## 2011-10-18 NOTE — Patient Instructions (Signed)
Mr. Pecina,  Thank you very much for coming to see me today:  1. Rash on hands and feet is a type of eczema called dyshidrotic eczema please use the new clobetasol cream twice daily for this.  2. For weakness I am concerned about a stroke. Please go for CT. Start daily baby aspirin. Once I have the results we will go for there for secondary prevention if you have had a stroke.  3. HTN: I will f/u the blood work and then adjust medications.  For now make sure to take all your medications as prescribed, eat a low salt diet and eat plenty of potassium (fruits and vegetables).  4. For spots on body: looks like molluscum contagiosum which is caused by a pox virus. Please come back for liquid nitrogen therapy.  5. For spot on head: shave biopsy today. I will call with results.   F/u in 2-3 weeks.   Dr. Armen Pickup

## 2011-10-23 ENCOUNTER — Telehealth: Payer: Self-pay | Admitting: Family Medicine

## 2011-10-23 ENCOUNTER — Encounter: Payer: Self-pay | Admitting: Family Medicine

## 2011-10-23 DIAGNOSIS — R7303 Prediabetes: Secondary | ICD-10-CM

## 2011-10-23 DIAGNOSIS — E1159 Type 2 diabetes mellitus with other circulatory complications: Secondary | ICD-10-CM | POA: Insufficient documentation

## 2011-10-23 NOTE — Telephone Encounter (Signed)
I called patient to discuss pertinent lab results, elevated A1c to 6.6 and elevated fasting blood sugar. His A1c does put him in the range of diabetes. He admits to eating a lot of carbs, sugar sweetened beverages and candies. He also admits to eating less fruits and vegetables. He plans to cut out sugar sweetened beverages, increase vegetables and exercises. He refuses metformin at this time. He agrees to rechecking his A1c and fasting blood sugar in 3 months time and starting metformin at that time if needed.

## 2011-10-23 NOTE — Telephone Encounter (Signed)
Pt not requesting results of CT, he was requesting results of labs.  Will forward to MD to give him a call because there are a few abnormal but he has lots of questions about what the next steps are and the "details" about the lab work. Loralee Weitzman, Maryjo Rochester

## 2011-10-23 NOTE — Telephone Encounter (Signed)
Wants to know results of CT scan - pls call

## 2011-10-25 ENCOUNTER — Telehealth: Payer: Self-pay | Admitting: Family Medicine

## 2011-10-25 ENCOUNTER — Ambulatory Visit (HOSPITAL_COMMUNITY)
Admission: RE | Admit: 2011-10-25 | Discharge: 2011-10-25 | Disposition: A | Discharge status: Home / Self Care | Payer: Medicaid Other | Source: Ambulatory Visit | Attending: Family Medicine | Admitting: Family Medicine

## 2011-10-25 DIAGNOSIS — R531 Weakness: Secondary | ICD-10-CM

## 2011-10-25 DIAGNOSIS — M6281 Muscle weakness (generalized): Secondary | ICD-10-CM | POA: Insufficient documentation

## 2011-10-25 MED ORDER — CARVEDILOL 25 MG PO TABS: 25.0000 mg | ORAL_TABLET | Freq: Two times a day (BID) | ORAL | Status: DC

## 2011-10-25 NOTE — Assessment & Plan Note (Signed)
Patient informed of new diagnosis. Has elected to increase exercise and consumer a low carb diet. Will recheck A1c in 3 months.

## 2011-10-25 NOTE — Telephone Encounter (Signed)
Message copied by [name] on Thu Oct 25, 2011 12:52 PM ------      Message from: Barbaraann Barthel      Created: Wed Oct 24, 2011 12:10 PM                   ----- Message -----         From: Bonnie [place]         Sent: 10/18/2011  12:22 PM           To: Barbaraann Barthel, MD

## 2011-10-25 NOTE — Assessment & Plan Note (Signed)
On hands and feet. Will treat with clobetasol cream and counseled patient to avoid irritants and keep hand dry.

## 2011-10-25 NOTE — Telephone Encounter (Signed)
Called pt gave path results of head shave biopsy-wart.  Patient reports he is running late to 12:30 scheduled CT scan. He states he will be there by 1:15 PM.  I called CT department at [facility] radiology and informed them.

## 2011-10-25 NOTE — Assessment & Plan Note (Signed)
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 Removed with shave biopsy and diagnosis confirmed with pathology.

## 2011-10-25 NOTE — Assessment & Plan Note (Signed)
On hands and feet. Will allow time to resolve spontaneously. If not resolution will freeze with liquid nitrogen.

## 2011-10-25 NOTE — Telephone Encounter (Signed)
Message copied by [name] on Thu Oct 25, 2011  5:50 PM ------      Message from: Barbaraann Barthel      Created: Thu Oct 25, 2011  5:12 PM                   ----- Message -----         From: Rad Results In Interface         Sent: 10/25/2011   2:06 PM           To: Barbaraann Barthel, MD

## 2011-10-25 NOTE — Assessment & Plan Note (Signed)
Currently compensated. On appropriate therapy. EF continues to improve on f/u ECHOs.  Will increase BB.

## 2011-10-25 NOTE — Progress Notes (Signed)
Subjective:     Patient ID: Tyrone Walker, male   DOB: [birth year], 36 y.o.   MRN: [id number]  HPI 36 yo M presents to discuss the following: 1. Health maintenance: will like to have lipids and A1c checked. Denies polyuria/polydipsia. 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   2. Hypertension: Taking medications as prescribed. BP consisently elevated above goal. Admits to SOB on exertion. Denies CP or LE edema.   3. Warts on hands and feet: present for about 3 weeks. Seem to be spreading. Girlfriend with similar lesions. Noticed on hands, wrist, and feet mostly between toes. Associated with dry scaly skin between toes and fingers.   4. Lesion on head: present for years. Seems to be bigger. He shaves it off when he shaves his head and it grows back.   5. R sided weakness: present x 2 months. Subtle change noted by patient. He denies blurred vision, dizziness. He feels that he has some drooping in his R eye. Denies palpitations. Denies tingling or numbness in extremities. Admits to bad headaches lately. He had bad headaches when he started all of the medications last year that got better. He admit to sensitivity to light.   Review of Systems As per HPI    Objective:   Physical Exam BP 140/98  Pulse 80  Temp(Src) 99 F (37.2 C) (Oral)  Ht 6' (1.829 m)  Wt 242 lb 6.4 oz (109.952 kg)  BMI 32.88 kg/m2 General appearance: alert, cooperative and no distress Head: Normocephalic, without obvious abnormality, atraumatic Eyes: conjunctivae/corneas clear. PERRL, EOM's intact.  Neck: no adenopathy, no carotid bruit, no JVD, supple, symmetrical, trachea midline and thyroid not enlarged, symmetric, no tenderness/mass/nodules Lungs: clear to auscultation bilaterally Heart: regular rate and rhythm, S1, S2 normal, no murmur, click, rub or gallop Skin: R scalp with raised irregular flesh colored macule about 1.5  x 1.5 cm.  Multiple flesh colored papules on hands, wrist and feet. Xerotic skin with linear excoriations between fingers and toes.  Neurologic: Mental status: Alert, oriented, thought content appropriate Cranial nerves: normal Sensory: normal Motor: very slight decrease strenght on R compared to L in this strong man. 4/5 compared to 5/5 on L.  Reflexes: 2+ and symmetric Coordination: normal Gait: Normal  Shave Biopsy Procedure Note  Pre-operative Diagnosis: verruca vulgaris  Post-operative Diagnosis: same  Locations:superior, right head  Indications: irritation and growth   Anesthesia: Lidocaine 1% with epinephrine   Procedure Details  History of allergy to iodine: no  Patient informed of the risks (including bleeding and infection) and benefits of the  procedure and Written informed consent obtained.  The lesion and surrounding area were given a sterile prep using chlorhexidine and draped in the usual sterile fashion. A blade was used to shave an area of skin approximately 2cm by 2cm.  Hemostasis achieved with alumuninum chloride. Antibiotic ointment and a sterile dressing applied.  The specimen was sent for pathologic examination. The patient tolerated the procedure well.  EBL: 2 ml  Findings: Verruca Vulgaris  Condition: Stable  Complications: none.  Plan: 1. Instructed to keep the wound dry and covered for 24-48h and clean thereafter. 2. Warning signs of infection were reviewed.   3. Recommended that the patient use OTC acetaminophen as needed for pain.  4. Return in 3 weeks.  Assessment and Plan:  See problem list

## 2011-10-25 NOTE — Telephone Encounter (Signed)
Called patient will results of CT.

## 2011-10-25 NOTE — Telephone Encounter (Signed)
Informed patient that if head was negative for stroke. Continue Asa. Will increase coreg to 25 mg BID.

## 2011-10-25 NOTE — Assessment & Plan Note (Signed)
Elevated BP. Compliant with and tolerating medications. Will increase coreg to 25 mg BID.

## 2011-10-25 NOTE — Assessment & Plan Note (Signed)
Precepted with Dr. Paula Compton. Subtle unilateral weakness concerning for remote cerebrovascular infarct in this man with multiple risk factors.  Plan: -obtain CT of head -start daily ASA -check A1c, TSH and lipids to risk stratify if CT of head is positive.

## 2011-10-31 ENCOUNTER — Ambulatory Visit: Payer: Medicaid Other | Admitting: Pulmonary Disease

## 2011-11-05 ENCOUNTER — Ambulatory Visit: Payer: Medicaid Other | Admitting: Pulmonary Disease

## 2011-11-06 ENCOUNTER — Telehealth: Payer: Self-pay | Admitting: Pulmonary Disease

## 2011-11-06 NOTE — Telephone Encounter (Signed)
I spoke with pt and he stated he needed to know if [facility] would set him up to have sinus surgery. I advised him [facility] only see's him for sleep and would to discuss this with his pcp. He voiced his understanding and needed nothing further

## 2011-11-08 ENCOUNTER — Ambulatory Visit (INDEPENDENT_AMBULATORY_CARE_PROVIDER_SITE_OTHER): Payer: Medicaid Other | Admitting: Family Medicine

## 2011-11-08 DIAGNOSIS — I10 Essential (primary) hypertension: Secondary | ICD-10-CM

## 2011-11-08 DIAGNOSIS — E119 Type 2 diabetes mellitus without complications: Secondary | ICD-10-CM

## 2011-11-08 NOTE — Progress Notes (Signed)
Patient ID: Tyrone Walker, male   DOB: [birth year], 36 y.o.   MRN: [id number] [facility]

## 2011-11-09 ENCOUNTER — Telehealth (HOSPITAL_COMMUNITY): Payer: Self-pay | Admitting: *Deleted

## 2011-11-09 NOTE — Telephone Encounter (Signed)
Spoke with Tyrone Walker.  There has been a problem with his paperwork for medicaid.  He has a 10 day lapse in coverage but has run out of his hydralazine and is unable to afford this.  He is able to come to the clinic to pick up a 30 day supply of his medication.  He is also due for a follow up.  Will have Dawn call and schedule follow up within the next month.

## 2011-11-09 NOTE — Telephone Encounter (Signed)
Left message for pt to call back  °

## 2011-11-09 NOTE — Telephone Encounter (Signed)
Patient called, his medicaid has ran out, he went to get his prescription and could not get it.  He has no income.  Wanted to know what he should do?  Please call patient.

## 2011-11-11 ENCOUNTER — Other Ambulatory Visit: Payer: Self-pay | Admitting: Physician Assistant

## 2011-11-11 DIAGNOSIS — I10 Essential (primary) hypertension: Secondary | ICD-10-CM

## 2011-11-11 MED ORDER — CARVEDILOL 6.25 MG PO TABS: 18.7500 mg | ORAL_TABLET | Freq: Two times a day (BID) | ORAL | Status: DC

## 2011-11-11 NOTE — Telephone Encounter (Signed)
Patient called in today, on Sunday, because his Medicaid ran out and he is now out of his Coreg. Chart indicates he is on Coreg 25 mg bid. But he states he only takes 18.75 mg bid. I will send in a one time Rx to [facility] on [address] (patient knows it is $4). Please contact patient to arrange appropriate follow up and further refills. Tereso Newcomer, PA-C  3:30 PM 11/11/2011

## 2011-11-15 ENCOUNTER — Encounter (HOSPITAL_COMMUNITY): Payer: Self-pay | Admitting: *Deleted

## 2011-11-15 NOTE — Telephone Encounter (Signed)
We have been trying to reach pt to rescedule his appoints he has missed but have been unable to reach him, we will continue to try, if pt were to become compliant with appointments we may be able to help with cost of meds

## 2011-11-16 ENCOUNTER — Telehealth (HOSPITAL_COMMUNITY): Payer: Self-pay | Admitting: *Deleted

## 2011-11-16 ENCOUNTER — Ambulatory Visit (HOSPITAL_COMMUNITY)
Admission: RE | Admit: 2011-11-16 | Discharge: 2011-11-16 | Disposition: A | Discharge status: Home / Self Care | Payer: Self-pay | Source: Ambulatory Visit | Attending: Internal Medicine | Admitting: Internal Medicine

## 2011-11-16 VITALS — BP 132/94 | HR 93 | Wt 239.0 lb

## 2011-11-16 DIAGNOSIS — Z79899 Other long term (current) drug therapy: Secondary | ICD-10-CM | POA: Insufficient documentation

## 2011-11-16 DIAGNOSIS — I509 Heart failure, unspecified: Secondary | ICD-10-CM | POA: Insufficient documentation

## 2011-11-16 DIAGNOSIS — I5022 Chronic systolic (congestive) heart failure: Secondary | ICD-10-CM | POA: Insufficient documentation

## 2011-11-16 DIAGNOSIS — I428 Other cardiomyopathies: Secondary | ICD-10-CM | POA: Insufficient documentation

## 2011-11-16 DIAGNOSIS — I10 Essential (primary) hypertension: Secondary | ICD-10-CM | POA: Insufficient documentation

## 2011-11-16 DIAGNOSIS — R519 Headache, unspecified: Secondary | ICD-10-CM | POA: Insufficient documentation

## 2011-11-16 DIAGNOSIS — Z7982 Long term (current) use of aspirin: Secondary | ICD-10-CM | POA: Insufficient documentation

## 2011-11-16 DIAGNOSIS — R51 Headache: Secondary | ICD-10-CM

## 2011-11-16 DIAGNOSIS — R7989 Other specified abnormal findings of blood chemistry: Secondary | ICD-10-CM | POA: Insufficient documentation

## 2011-11-16 MED ORDER — CARVEDILOL 6.25 MG PO TABS: 25.0000 mg | ORAL_TABLET | Freq: Two times a day (BID) | ORAL | Status: DC

## 2011-11-16 NOTE — Assessment & Plan Note (Signed)
Volume status looks good.  NYHA I-II.  After reviewing his medications his HA may be exacerbated by isosorbide, will discontinue this today and follow.  Will also increase carvedilol 25 mg BID.  He is agreeable to this.  Currently he says he has enough medication at home but when his prescriptions run out he will not be able to afford them.  I have instructed him to call and we can help him with his HF meds.  He was discouraged about his medicaid running out and says he was going to apply for disability again (denied the 1st time around).  I have discussed with him is last echo with improved dysfunction as well as his CPX test.  I do not feel he qualifies for disability at this time.  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  He was optimistic that stopping the isosorbide may help him.  We will follow him closely.

## 2011-11-16 NOTE — Assessment & Plan Note (Signed)
As above, will stop nitrate to see if this helps.

## 2011-11-16 NOTE — Progress Notes (Signed)
History of Present Illness: Primary Cardiologist:  Dr. Arvilla Meres  Tyrone Walker is a 36 y.o. male with history of systolic heart failure due to NICM, EF initially 20% but showed improvement to 35-40% by echo 06/2011.  Cath 11/2010 showed normal cors.  He had serology done to evaluate etiology of his myopathy.  All titers were negative except for Coxsackie B2 titer was elevated.    He had CPX 02/05/11: pVO2 23.6 (71%)  Ve/VCO2 27 O2 pulse 80% RER 1.25 VE/MVV 56%  Echo 06/2011: LVEF 35-40%, diffuse hypokinesis.  Grade 1 diastolic dysfunction.    He returns for follow up today.  He is really stressed.  He has lost his medicaid on 10/23/11 because he lives with his fiance.  He has no income and is unsure how he is going to get his medications.  He also c/o a HA almost all the time.  He underwent a CT of the head which showed no acute abnormalities and was evaluated by opthalmologist which was also benign.  He denies orthopnea/PND/edema.  He is trying to increase his exercise routine.       Past Medical History  Diagnosis Date  . history Marijuana smoker   . Elevated LFTs     likely hepatic congestion from CHF  . Colitis 10/12/10    ER visit  . 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 cath 11/22/10: normal cors; EF 15-20%;  b. echo 11/19/10: EF 20%, mild MR, mild LAE, PASP 48; c. dx 5/12, tx with IV milrinone for low output, Coxsackie B2 titer elevated; o/w HIV, CMV, RMSF, ANA, RF, ESR, ACE all normal  . Chronic systolic heart failure   . Hypertension 10/18/2011    Current Outpatient Prescriptions  Medication Sig Dispense Refill  . acetaminophen (TYLENOL) 325 MG tablet Take 650 mg by mouth every 6 (six) hours as needed.       Marland Kitchen aspirin EC 81 MG tablet Take 1 tablet (81 mg total) by mouth daily.  90 tablet  3  . aspirin-acetaminophen-caffeine (EXCEDRIN MIGRAINE) 250-250-65 MG per tablet Take 1 tablet by mouth every 6 (six) hours as needed.        . carvedilol (COREG) 6.25 MG tablet Take 3  tablets (18.75 mg total) by mouth 2 (two) times daily with a meal.  180 tablet  1  . cetirizine (ZYRTEC) 10 MG tablet Take 1 tablet (10 mg total) by mouth daily.  30 tablet  11  . clobetasol cream (TEMOVATE) 0.05 % Apply topically 2 (two) times daily.  60 g  0  . digoxin (LANOXIN) 0.25 MG tablet Take 1 tablet (250 mcg total) by mouth daily.  30 tablet  6  . fluticasone (FLONASE) 50 MCG/ACT nasal spray Place 2 sprays into the nose daily.  16 g  6  . furosemide (LASIX) 40 MG tablet Take 1 tablet (40 mg total) by mouth daily.  30 tablet  11  . hydrALAZINE (APRESOLINE) 25 MG tablet Take 37.5 mg by mouth 2 (two) times daily.       . isosorbide mononitrate (IMDUR) 30 MG 24 hr tablet Take 30 mg by mouth daily.        Marland Kitchen lisinopril (PRINIVIL,ZESTRIL) 20 MG tablet Take 1 tablet (20 mg total) by mouth 2 (two) times daily.  30 tablet  6  . spironolactone (ALDACTONE) 25 MG tablet Take 1 tablet (25 mg total) by mouth daily.  30 tablet  11    Allergies: No Known Allergies   ROS:  All  pertinent positives and negatives as in HPI, otherwise negative  Vital Signs: Filed Vitals:   11/16/11 1047  BP: 132/94  Pulse: 93  Weight: 239 lb (108.41 kg)  SpO2: 98%    PHYSICAL EXAM: Well nourished, well developed, in no acute distress HEENT: normal Neck: Thick. JVP flat Cardiac:  normal S1, S2; RRR; no murmur, no gallops Lungs:  clear to auscultation bilaterally, no wheezing, rhonchi or rales Abd: obese soft, nontender, no hepatomegaly Ext: no edema;  Skin: warm and dry, scattered papular rash on upper chest and shoulders with minimal excoriation; no burrows noted Neuro:  CNs 2-12 intact, no focal abnormalities noted  ASSESSMENT AND PLAN:

## 2011-11-16 NOTE — Patient Instructions (Addendum)
Stop isosorbide mononitrate (imdur).    Increase carvedilol 25 mg (4 tabs) twice daily.    Follow up 2 weeks.

## 2011-11-16 NOTE — Telephone Encounter (Signed)
Mr Bhagat called today to give you information on his medication.  He has 6 days left of isosorbidemonitrate, 7 days of lisinopril, 7 days flurosemide, 5 days spironolcatone, and 5 days digoxin. Everything else is full.  Please follow up with him.  Thanks.

## 2011-11-20 NOTE — Telephone Encounter (Signed)
Will get 30 day supply of his HF meds as he is not going to get medicaid and does not have income at this time.  The supply will be left up front with Dawn.  He voiced understanding and appreciated the call back.

## 2011-11-23 ENCOUNTER — Ambulatory Visit (INDEPENDENT_AMBULATORY_CARE_PROVIDER_SITE_OTHER): Payer: Self-pay | Admitting: Family Medicine

## 2011-11-23 ENCOUNTER — Encounter: Payer: Self-pay | Admitting: Family Medicine

## 2011-11-23 VITALS — BP 137/91 | HR 94 | Temp 98.2°F | Ht 72.0 in | Wt 238.0 lb

## 2011-11-23 DIAGNOSIS — B078 Other viral warts: Secondary | ICD-10-CM

## 2011-11-23 DIAGNOSIS — R51 Headache: Secondary | ICD-10-CM

## 2011-11-23 DIAGNOSIS — B081 Molluscum contagiosum: Secondary | ICD-10-CM

## 2011-11-23 DIAGNOSIS — B079 Viral wart, unspecified: Secondary | ICD-10-CM

## 2011-11-23 DIAGNOSIS — H538 Other visual disturbances: Secondary | ICD-10-CM

## 2011-11-23 DIAGNOSIS — E119 Type 2 diabetes mellitus without complications: Secondary | ICD-10-CM

## 2011-11-23 DIAGNOSIS — L301 Dyshidrosis [pompholyx]: Secondary | ICD-10-CM

## 2011-11-23 DIAGNOSIS — I10 Essential (primary) hypertension: Secondary | ICD-10-CM

## 2011-11-23 NOTE — Assessment & Plan Note (Signed)
A: improving with clobetasol. P: continue clobetasol.

## 2011-11-23 NOTE — Progress Notes (Signed)
Subjective:     Patient ID: Tyrone Walker, male   DOB: 04/20/1976, 36 y.o.   MRN: [id number]  HPI Mr. Hass presents for follow-up to discuss the following: 1. Headaches: resolved. He has stopped imdur x 2 weeks under the guidance of his cardiology and believes that stopping imdur helped his headaches.   2. HTN/Heart failure: compliant with all meds. Notes chronic SOB and occasional swelling. Would like to get off some of his meds.     3. Diabetes: patient working on improving lifestyle. Walking daily. Has cut out soda. Still has some juice and candy. Denies polydipsia. Admits to polyuria but taking diuretics.   4. Dyshidrotic eczema: improved with clobetasol cream.  5. Molluscum: resolving spontaneously.    Review of Systems As per HPI    Objective:   Physical Exam BP 137/91  Pulse 94  Temp(Src) 98.2 F (36.8 C) (Oral)  Ht 6' (1.829 m)  Wt 238 lb (107.956 kg)  BMI 32.28 kg/m2 General appearance: alert, cooperative and no distress Lungs: clear to auscultation bilaterally Heart: regular rate and rhythm, S1, S2 normal, no murmur, click, rub or gallop Extremities: edema trace Skin:  Healed scalp scar s/p excisional biopsy. Flattened molluscum on hands.     Assessment and Plan:  See problem list

## 2011-11-23 NOTE — Assessment & Plan Note (Signed)
Normal ophthalmology exam.

## 2011-11-23 NOTE — Assessment & Plan Note (Signed)
Improving spontaneously. Monitor.

## 2011-11-23 NOTE — Assessment & Plan Note (Signed)
A: lifestyle modifications.  P: repeat A1c in early August.

## 2011-11-23 NOTE — Assessment & Plan Note (Signed)
A: resolved following stopping imdur.

## 2011-11-23 NOTE — Assessment & Plan Note (Signed)
Excised.

## 2011-11-23 NOTE — Assessment & Plan Note (Signed)
A: stable. Would like improved in BP to SBP < 130 and DBP closer to 90.  Meds: compliant. We discussed that he is on the BALDS regimen for heart failure and all meds are appropriate and necessary. May be able to stop hydralazine in the future under the guidance of cardiology.  P: continue current regimen patient with cardiology f/u on June 6.

## 2011-11-23 NOTE — Patient Instructions (Signed)
Mr. Wuthrich,  Thank you for coming in to see me today. Please f/u in 2 months for blood work (repeat A1c)  Call and come in sooner as needed for other concerns or for treatment of warts/eczema.   Dr. Armen Pickup

## 2011-11-28 ENCOUNTER — Encounter (HOSPITAL_COMMUNITY): Payer: Self-pay

## 2011-11-28 ENCOUNTER — Ambulatory Visit (HOSPITAL_COMMUNITY): Admission: RE | Admit: 2011-11-28 | Payer: Self-pay | Source: Ambulatory Visit

## 2011-11-28 ENCOUNTER — Ambulatory Visit (HOSPITAL_COMMUNITY)
Admission: RE | Admit: 2011-11-28 | Discharge: 2011-11-28 | Disposition: A | Discharge status: Home / Self Care | Payer: Medicaid Other | Source: Ambulatory Visit | Attending: Internal Medicine | Admitting: Internal Medicine

## 2011-11-28 VITALS — BP 122/74 | HR 85 | Resp 18 | Ht 72.0 in | Wt 240.0 lb

## 2011-11-28 DIAGNOSIS — Z7982 Long term (current) use of aspirin: Secondary | ICD-10-CM | POA: Insufficient documentation

## 2011-11-28 DIAGNOSIS — I519 Heart disease, unspecified: Secondary | ICD-10-CM | POA: Insufficient documentation

## 2011-11-28 DIAGNOSIS — I509 Heart failure, unspecified: Secondary | ICD-10-CM | POA: Insufficient documentation

## 2011-11-28 DIAGNOSIS — I5022 Chronic systolic (congestive) heart failure: Secondary | ICD-10-CM | POA: Insufficient documentation

## 2011-11-28 DIAGNOSIS — I10 Essential (primary) hypertension: Secondary | ICD-10-CM

## 2011-11-28 NOTE — Assessment & Plan Note (Signed)
Mr. Eckels is doing great.  His volume status is well controlled.  NYHA I-II.  Will continue current therapy.  No imdur due to HA.  He has been compliant with his meds and now on optimal medical therapy therefore will recheck echo at his next visit.  Encourage him to continue weighing daily and follow low sodium diet.  He will also continue exercising.

## 2011-11-28 NOTE — Progress Notes (Signed)
History of Present Illness: Primary Cardiologist:  Dr. Arvilla Meres  Tyrone Walker is a 36 y.o. male with history of systolic heart failure due to NICM, EF initially 20% but showed improvement to 35-40% by echo 06/2011.  Cath 11/2010 showed normal cors.  He had serology done to evaluate etiology of his myopathy.  All titers were negative except for Coxsackie B2 titer was elevated.    He had CPX 02/05/11: pVO2 23.6 (71%)  Ve/VCO2 27 O2 pulse 80% RER 1.25 VE/MVV 56%  Echo 06/2011: LVEF 35-40%, diffuse hypokinesis.  Grade 1 diastolic dysfunction.    He returns for follow up today.  Last visit his imdur was stopped due to HA/blurred vision.  This has cleared since stopping imdur and he feels great.  He is walking 2 miles about everyday.   His dyspnea is better.  He denies orthopnea/PND/edema.  His working on getting medication covered.        Past Medical History  Diagnosis Date  . history Marijuana smoker   . Elevated LFTs     likely hepatic congestion from CHF  . Colitis 10/12/10    ER visit  . 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 cath 11/22/10: normal cors; EF 15-20%;  b. echo 11/19/10: EF 20%, mild MR, mild LAE, PASP 48; c. dx 5/12, tx with IV milrinone for low output, Coxsackie B2 titer elevated; o/w HIV, CMV, RMSF, ANA, RF, ESR, ACE all normal  . Chronic systolic heart failure   . Hypertension 10/18/2011    Current Outpatient Prescriptions  Medication Sig Dispense Refill  . acetaminophen (TYLENOL) 325 MG tablet Take 650 mg by mouth every 6 (six) hours as needed.       Marland Kitchen aspirin EC 81 MG tablet Take 1 tablet (81 mg total) by mouth daily.  90 tablet  3  . carvedilol (COREG) 6.25 MG tablet Take 4 tablets (25 mg total) by mouth 2 (two) times daily with a meal.  180 tablet  1  . cetirizine (ZYRTEC) 10 MG tablet Take 1 tablet (10 mg total) by mouth daily.  30 tablet  11  . clobetasol cream (TEMOVATE) 0.05 % Apply topically 2 (two) times daily.  60 g  0  . digoxin (LANOXIN) 0.25 MG  tablet Take 1 tablet (250 mcg total) by mouth daily.  30 tablet  6  . furosemide (LASIX) 40 MG tablet Take 1 tablet (40 mg total) by mouth daily.  30 tablet  11  . hydrALAZINE (APRESOLINE) 25 MG tablet Take 37.5 mg by mouth 2 (two) times daily.       Marland Kitchen lisinopril (PRINIVIL,ZESTRIL) 20 MG tablet Take 1 tablet (20 mg total) by mouth 2 (two) times daily.  30 tablet  6  . spironolactone (ALDACTONE) 25 MG tablet Take 1 tablet (25 mg total) by mouth daily.  30 tablet  11    Allergies: No Known Allergies   ROS:  All pertinent positives and negatives as in HPI, otherwise negative  Vital Signs: Filed Vitals:   11/28/11 1409  BP: 122/74  Pulse: 85  Resp: 18  Height: 6' (1.829 m)  Weight: 240 lb (108.863 kg)    PHYSICAL EXAM: Well nourished, well developed, in no acute distress HEENT: normal Neck: Thick. JVP flat Cardiac:  normal S1, S2; RRR; no murmur, no gallops Lungs:  clear to auscultation bilaterally, no wheezing, rhonchi or rales Abd: obese soft, nontender, no hepatomegaly Ext: no edema;  Skin: warm and dry, scattered papular rash on upper chest and shoulders  with minimal excoriation; no burrows noted Neuro:  CNs 2-12 intact, no focal abnormalities noted  ASSESSMENT AND PLAN:

## 2011-11-28 NOTE — Assessment & Plan Note (Signed)
Controlled, continue current therapy

## 2011-11-28 NOTE — Patient Instructions (Signed)
Follow up 5 weeks with an echo.  Your physician has requested that you have an echocardiogram. Echocardiography is a painless test that uses sound waves to create images of your heart. It provides your doctor with information about the size and shape of your heart and how well your heart's chambers and valves are working. This procedure takes approximately one hour. There are no restrictions for this procedure.  Keep up the good work!

## 2011-12-26 ENCOUNTER — Telehealth (HOSPITAL_COMMUNITY): Payer: Self-pay | Admitting: *Deleted

## 2011-12-26 NOTE — Telephone Encounter (Signed)
Tyrone Walker called this am, he would like to speak with someone regarding his nitrate. Please give him a call. Thanks.

## 2011-12-26 NOTE — Telephone Encounter (Signed)
Left message to call back  

## 2011-12-26 NOTE — Telephone Encounter (Signed)
Spoke w/pt, he has been off Imdur as directed and HA have gone away he just wanted [place] to know and make sure he didn't need to be on anything else, advised he didn't need anything else at this time per Ulyess Blossom, PA continue current meds he is agreeable

## 2012-01-06 ENCOUNTER — Other Ambulatory Visit: Payer: Self-pay | Admitting: Physician Assistant

## 2012-01-06 ENCOUNTER — Other Ambulatory Visit (HOSPITAL_COMMUNITY): Payer: Self-pay | Admitting: Internal Medicine

## 2012-01-09 ENCOUNTER — Other Ambulatory Visit (HOSPITAL_COMMUNITY): Payer: Self-pay | Admitting: Internal Medicine

## 2012-01-09 DIAGNOSIS — I10 Essential (primary) hypertension: Secondary | ICD-10-CM

## 2012-01-09 MED ORDER — SPIRONOLACTONE 25 MG PO TABS: 25.0000 mg | ORAL_TABLET | Freq: Every day | ORAL | Status: DC

## 2012-01-09 MED ORDER — CARVEDILOL 25 MG PO TABS: 25.0000 mg | ORAL_TABLET | Freq: Two times a day (BID) | ORAL | Status: DC

## 2012-01-09 MED ORDER — LISINOPRIL 20 MG PO TABS: 20.0000 mg | ORAL_TABLET | Freq: Two times a day (BID) | ORAL | Status: DC

## 2012-01-09 MED ORDER — FUROSEMIDE 40 MG PO TABS: 40.0000 mg | ORAL_TABLET | Freq: Every day | ORAL | Status: DC

## 2012-01-09 MED ORDER — DIGOXIN 250 MCG PO TABS: 250.0000 ug | ORAL_TABLET | Freq: Every day | ORAL | Status: DC

## 2012-01-09 MED ORDER — HYDRALAZINE HCL 25 MG PO TABS: 37.5000 mg | ORAL_TABLET | Freq: Two times a day (BID) | ORAL | Status: DC

## 2012-01-09 NOTE — Telephone Encounter (Signed)
Pt needs blood pressure refilled.. He stated his medicaid has been reinstated. Please call pt once refilled. Thanks

## 2012-01-09 NOTE — Telephone Encounter (Signed)
Spoke w/pt he is aware nex prescriptions for all meds sent to pharmacy

## 2012-01-24 ENCOUNTER — Ambulatory Visit (HOSPITAL_COMMUNITY)
Admission: RE | Admit: 2012-01-24 | Discharge: 2012-01-24 | Disposition: A | Discharge status: Home / Self Care | Payer: Medicaid Other | Source: Ambulatory Visit | Attending: Physician Assistant | Admitting: Physician Assistant

## 2012-01-24 ENCOUNTER — Ambulatory Visit (HOSPITAL_COMMUNITY)
Admission: RE | Admit: 2012-01-24 | Discharge: 2012-01-24 | Disposition: A | Discharge status: Home / Self Care | Payer: Medicaid Other | Source: Ambulatory Visit | Attending: Internal Medicine | Admitting: Internal Medicine

## 2012-01-24 ENCOUNTER — Encounter (HOSPITAL_COMMUNITY): Payer: Self-pay

## 2012-01-24 VITALS — BP 118/88 | HR 74 | Resp 16 | Ht 72.0 in | Wt 236.5 lb

## 2012-01-24 DIAGNOSIS — I5022 Chronic systolic (congestive) heart failure: Secondary | ICD-10-CM

## 2012-01-24 DIAGNOSIS — I059 Rheumatic mitral valve disease, unspecified: Secondary | ICD-10-CM

## 2012-01-24 NOTE — Progress Notes (Signed)
  Echocardiogram 2D Echocardiogram has been performed.  Tyrone Walker FRANCES 01/24/2012, 10:56 AM

## 2012-01-24 NOTE — Patient Instructions (Addendum)
Continue current medications    Follow up 3 months

## 2012-01-24 NOTE — Progress Notes (Signed)
History of Present Illness: Primary Cardiologist:  Dr. Arvilla Meres  Tyrone Walker is a 36 y.o. male with history of systolic heart failure due to NICM, EF initially 20% but showed improvement to 35-40% by echo 06/2011.  Cath 11/2010 showed normal cors.  He had serology done to evaluate etiology of his myopathy.  All titers were negative except for Coxsackie B2 titer was elevated.    He had CPX 02/05/11: pVO2 23.6 (71%)  Ve/VCO2 27 O2 pulse 80% RER 1.25 VE/MVV 56%  Echo 06/2011: LVEF 35-40%, diffuse hypokinesis.  Grade 1 diastolic dysfunction.    He returns for follow up today.  "Feels like he is 17 again".  Able to work out, doing push ups.  No more HA/blurred vision.  He denies SOB/orthopnea/PND.  No edema.    EF: 30%     Past Medical History  Diagnosis Date  . history Marijuana smoker   . Elevated LFTs     likely hepatic congestion from CHF  . Colitis 10/12/10    ER visit  . 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 cath 11/22/10: normal cors; EF 15-20%;  b. echo 11/19/10: EF 20%, mild MR, mild LAE, PASP 48; c. dx 5/12, tx with IV milrinone for low output, Coxsackie B2 titer elevated; o/w HIV, CMV, RMSF, ANA, RF, ESR, ACE all normal  . Chronic systolic heart failure   . Hypertension 10/18/2011    Current Outpatient Prescriptions  Medication Sig Dispense Refill  . acetaminophen (TYLENOL) 325 MG tablet Take 650 mg by mouth every 6 (six) hours as needed.       Marland Kitchen aspirin EC 81 MG tablet Take 1 tablet (81 mg total) by mouth daily.  90 tablet  3  . carvedilol (COREG) 25 MG tablet Take 1 tablet (25 mg total) by mouth 2 (two) times daily with a meal.  60 tablet  6  . cetirizine (ZYRTEC) 10 MG tablet Take 1 tablet (10 mg total) by mouth daily.  30 tablet  11  . clobetasol cream (TEMOVATE) 0.05 % Apply topically 2 (two) times daily.  60 g  0  . digoxin (LANOXIN) 0.25 MG tablet Take 1 tablet (250 mcg total) by mouth daily.  30 tablet  6  . furosemide (LASIX) 40 MG tablet Take 1 tablet (40 mg  total) by mouth daily.  30 tablet  6  . hydrALAZINE (APRESOLINE) 25 MG tablet Take 1.5 tablets (37.5 mg total) by mouth 2 (two) times daily.  90 tablet  6  . lisinopril (PRINIVIL,ZESTRIL) 20 MG tablet Take 1 tablet (20 mg total) by mouth 2 (two) times daily.  60 tablet  6  . spironolactone (ALDACTONE) 25 MG tablet Take 1 tablet (25 mg total) by mouth daily.  30 tablet  6    Allergies: No Known Allergies   ROS:  All pertinent positives and negatives as in HPI, otherwise negative  Vital Signs: Filed Vitals:   01/24/12 1104  BP: 118/88  Pulse: 74  Resp: 16  Height: 6' (1.829 m)  Weight: 236 lb 8 oz (107.276 kg)  SpO2: 98%    PHYSICAL EXAM: Well nourished, well developed, in no acute distress HEENT: normal Neck: Thick. JVP flat Cardiac:  normal S1, S2; RRR; no murmur, no gallops Lungs:  clear to auscultation bilaterally, no wheezing, rhonchi or rales Abd: obese soft, nontender, no hepatomegaly Ext: no edema;  Skin: warm and dry, scattered papular rash on upper chest and shoulders with minimal excoriation; no burrows noted Neuro:  CNs 2-12  intact, no focal abnormalities noted  ASSESSMENT AND PLAN:

## 2012-01-25 NOTE — Assessment & Plan Note (Addendum)
He is doing great functionally but EF remains depressed.  Have encouraged him to continue with low sodium diet and fluid restrictions.  Will continue current regimen.    Patient seen and examined with Ulyess Blossom, PA-C. We discussed all aspects of the encounter. I agree with the assessment and plan as stated above. Echo reviewed in clinic today. EF still in 30-35% Range but he is doing great otherwise. NYHA I so not candidate for ICD. Will continue current meds.  Reinforced need for daily weights and reviewed use of sliding scale diuretics.

## 2012-01-25 NOTE — Assessment & Plan Note (Signed)
Controlled, continue current regimen

## 2012-01-30 ENCOUNTER — Telehealth: Payer: Self-pay | Admitting: Physician Assistant

## 2012-01-30 NOTE — Telephone Encounter (Signed)
Patient called regarding lower substernal chest/epigastric pain occurring this morning on waking described as a "bloating discomfort" with associated n/v (watery, NBNB) and diaphoresis, and persisting all day. Not worse, but in fact relieved with exertion. Alleviated by palpation to the area. No shortness of breath. He has measured his weights regularly, and states that there has been no increase over the last few days. He has actually noticed a 5 lbs weight loss. No prior episodes of chest pain leading up to this, he had been feeling well. Not associated with meals. No increased LE edema, orthopnea, PND, new cough, fevers, chills or diarrhea. He states he had a normal bowel movement today. No chronic NSAID use. He states this feels like indigestion in some ways, but more severe. Has not taken medications to alleviate. On chart review, he does have a history of colitis last year and elevated LFTs believed to be secondary to passive hepatic congestion from CHF. He also has history of a normal cath in 10/2010. Advised to try Pepto Bismol/Mylanta/Maalox for alleviation. If pain persists, advised to go to the ED for evaluation of acute abdominal process/hepatitis and rule out for acute atypical cardiac event. Patient understood and agreed.     Jacqulyn Bath, PA-C 01/30/2012 6:16 PM

## 2012-02-12 ENCOUNTER — Ambulatory Visit: Payer: Medicaid Other | Admitting: Family Medicine

## 2012-03-06 ENCOUNTER — Encounter: Payer: Self-pay | Admitting: Family Medicine

## 2012-03-06 ENCOUNTER — Ambulatory Visit (INDEPENDENT_AMBULATORY_CARE_PROVIDER_SITE_OTHER): Payer: Medicaid Other | Admitting: Family Medicine

## 2012-03-06 VITALS — BP 121/74 | HR 88 | Temp 99.1°F | Ht 72.0 in | Wt 236.0 lb

## 2012-03-06 DIAGNOSIS — R531 Weakness: Secondary | ICD-10-CM

## 2012-03-06 DIAGNOSIS — J019 Acute sinusitis, unspecified: Secondary | ICD-10-CM

## 2012-03-06 DIAGNOSIS — B9689 Other specified bacterial agents as the cause of diseases classified elsewhere: Secondary | ICD-10-CM

## 2012-03-06 DIAGNOSIS — M6281 Muscle weakness (generalized): Secondary | ICD-10-CM

## 2012-03-06 DIAGNOSIS — I5022 Chronic systolic (congestive) heart failure: Secondary | ICD-10-CM

## 2012-03-06 LAB — BASIC METABOLIC PANEL
BUN: 13 mg/dL (ref 6–23)
Calcium: 9.7 mg/dL (ref 8.4–10.5)
Glucose, Bld: 134 mg/dL — ABNORMAL HIGH (ref 70–99)
Potassium: 3.9 mEq/L (ref 3.5–5.3)
Sodium: 138 mEq/L (ref 135–145)

## 2012-03-06 LAB — CBC
Platelets: 259 10*3/uL (ref 150–400)
RBC: 4.68 MIL/uL (ref 4.22–5.81)
RDW: 13.9 % (ref 11.5–15.5)
WBC: 5.3 10*3/uL (ref 4.0–10.5)

## 2012-03-06 MED ORDER — AMOXICILLIN-POT CLAVULANATE 875-125 MG PO TABS: 1.0000 | ORAL_TABLET | Freq: Two times a day (BID) | ORAL | Status: AC

## 2012-03-06 NOTE — Patient Instructions (Addendum)
Tyrone Walker,  Thank you for coming in today.  1. Acute bacterial sinusitis: -continue zyrtec daily  -augmentin 1 tab twice daily for 10 days  -tylenol for pain relief.   2. Letter for dentists provided.   3. CHF: BP well controlled. No sign of fluid overload. Ok to take an extra lasix for next 3 days, then back to once daily. Will check potassium  today.   F/u in 2-3 weeks if sinus issues persist.   Dr. Armen Pickup

## 2012-03-24 NOTE — Assessment & Plan Note (Signed)
A: suspect ABS. Afebrile today but symptomatic. P: Course of doxycycline Check CBC and ESR.

## 2012-03-24 NOTE — Progress Notes (Signed)
Subjective:     Patient ID: Tyrone Walker, male   DOB: [birth year], 36 y.o.   MRN: [id number]  HPI 36 yo M presents for office visit to discuss the following:  1. Bilateral headache: associated with nasal congestion and elevated temp to 101 at home. He is compliant with zyrtec. He denies sick contacts.   2. CHF: denies CP and shortness of breath. Feeling heavier. Would like to take extra lasix. Took an extra dose on 03/03/12. Has followed up with his cardiologist. EF down. Plan for AICD placement.   3. Letter for dentist: needs medical clearance for dental clearing.   Review of Systems As per HPI    Objective:   Physical Exam BP 121/74  Pulse 88  Temp 99.1 F (37.3 C) (Oral)  Ht 6' (1.829 m)  Wt 236 lb (107.049 kg)  BMI 32.01 kg/m2 Wt Readings from Last 3 Encounters:  03/06/12 236 lb (107.049 kg)  01/24/12 236 lb 8 oz (107.276 kg)  11/28/11 240 lb (108.863 kg)   General appearance: alert, cooperative and no distress Head: Normocephalic, without obvious abnormality, atraumatic Eyes: conjunctivae/corneas clear. PERRL, EOM's intact.  Ears: normal TM's and external ear canals both ears Nose: turbinates red, swollen, edematous Throat: lips, mucosa, and tongue normal; teeth and gums normal Lungs: clear to auscultation bilaterally Heart: regular rate and rhythm, S1, S2 normal, no murmur, click, rub or gallop Extremities: extremities normal, atraumatic, no cyanosis or edema Neurologic: Grossly normal     Assessment and Plan:

## 2012-03-24 NOTE — Assessment & Plan Note (Addendum)
A: no evidence of fluid overload. Patient feels heavy.  P: increase lasix to BID x 4 days, then back to once daily.  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

## 2012-03-24 NOTE — Assessment & Plan Note (Signed)
A: resolved. CT head negative. Labs work revealed diabetes.

## 2012-04-28 ENCOUNTER — Ambulatory Visit (HOSPITAL_COMMUNITY)
Admission: RE | Admit: 2012-04-28 | Discharge: 2012-04-28 | Disposition: A | Discharge status: Home / Self Care | Payer: Medicaid Other | Source: Ambulatory Visit | Attending: Internal Medicine | Admitting: Internal Medicine

## 2012-04-28 ENCOUNTER — Ambulatory Visit: Payer: Medicaid Other | Admitting: Family Medicine

## 2012-04-28 ENCOUNTER — Encounter (HOSPITAL_COMMUNITY): Payer: Self-pay

## 2012-04-28 VITALS — BP 142/90 | HR 119 | Resp 18 | Ht 73.0 in | Wt 242.8 lb

## 2012-04-28 DIAGNOSIS — I428 Other cardiomyopathies: Secondary | ICD-10-CM

## 2012-04-28 DIAGNOSIS — I5022 Chronic systolic (congestive) heart failure: Secondary | ICD-10-CM

## 2012-04-28 NOTE — Progress Notes (Signed)
*  PRELIMINARY RESULTS* Echocardiogram 2D Echocardiogram has been performed.  Tyrone Walker 04/28/2012, 3:50 PM

## 2012-04-28 NOTE — Assessment & Plan Note (Addendum)
NYHA I.  EF has recovered.  Have discussed with patient.  Will discontinue digoxin.  Continue carvedilol, lisinopril, hydralazine and spiro.  The patient has been referred to [facility] in [facility] for further follow up.    Patient seen and examined with Ulyess Blossom, PA-C. We discussed all aspects of the encounter. I agree with the assessment and plan as stated above.  Echo reviewed personally. EF 50-55%. He has recovered. NYHA I. Continue current medications. I have called personally and arranged for him to f/u in the HF clinic in [place]. Can f/u with [place] as needed.

## 2012-04-28 NOTE — Patient Instructions (Addendum)
Stop digoxin  Will call about echo results

## 2012-04-28 NOTE — Assessment & Plan Note (Signed)
Controlled, continue current meds.   

## 2012-04-28 NOTE — Progress Notes (Signed)
History of Present Illness: Primary Cardiologist:  Dr. Arvilla Meres  Tyrone Walker is a 36 y.o. male with history of systolic heart failure due to NICM, EF initially 20% but showed improvement to 35-40% by echo 06/2011.  Cath 11/2010 showed normal cors.  He had serology done to evaluate etiology of his myopathy.  All titers were negative except for Coxsackie B2 titer was elevated.    He had CPX 02/05/11: pVO2 23.6 (71%)  Ve/VCO2 27 O2 pulse 80% RER 1.25 VE/MVV 56%  Echo 06/2011: LVEF 35-40%, diffuse hypokinesis.  Grade 1 diastolic dysfunction.   01/24/12: LVEF 30-35%, grade 1 diastolic dysfunction.  Mod  He returns for follow up today.  He feels well today.  He continues to work out daily.  He denies SOB/orthopnea/PND.  No edema.  He has moved to [place], [place] with his fiance.  Compliant with meds.    Echo today: EF: 50-55%     Past Medical History  Diagnosis Date  . history Marijuana smoker   . Elevated LFTs     likely hepatic congestion from CHF  . Colitis 10/12/10    ER visit  . 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 cath 11/22/10: normal cors; EF 15-20%;  b. echo 11/19/10: EF 20%, mild MR, mild LAE, PASP 48; c. dx 5/12, tx with IV milrinone for low output, Coxsackie B2 titer elevated; o/w HIV, CMV, RMSF, ANA, RF, ESR, ACE all normal  . Chronic systolic heart failure   . Hypertension 10/18/2011    Current Outpatient Prescriptions  Medication Sig Dispense Refill  . acetaminophen (TYLENOL) 325 MG tablet Take 650 mg by mouth every 6 (six) hours as needed.       Marland Kitchen aspirin EC 81 MG tablet Take 1 tablet (81 mg total) by mouth daily.  90 tablet  3  . carvedilol (COREG) 25 MG tablet Take 1 tablet (25 mg total) by mouth 2 (two) times daily with a meal.  60 tablet  6  . cetirizine (ZYRTEC) 10 MG tablet Take 1 tablet (10 mg total) by mouth daily.  30 tablet  11  . clobetasol cream (TEMOVATE) 0.05 % Apply topically 2 (two) times daily.  60 g  0  . digoxin (LANOXIN) 0.25 MG tablet Take 1  tablet (250 mcg total) by mouth daily.  30 tablet  6  . furosemide (LASIX) 40 MG tablet Take 1 tablet (40 mg total) by mouth daily.  30 tablet  6  . hydrALAZINE (APRESOLINE) 25 MG tablet Take 1.5 tablets (37.5 mg total) by mouth 2 (two) times daily.  90 tablet  6  . lisinopril (PRINIVIL,ZESTRIL) 20 MG tablet Take 1 tablet (20 mg total) by mouth 2 (two) times daily.  60 tablet  6  . spironolactone (ALDACTONE) 25 MG tablet Take 1 tablet (25 mg total) by mouth daily.  30 tablet  6    Allergies: No Known Allergies   ROS:  All pertinent positives and negatives as in HPI, otherwise negative  Vital Signs: Filed Vitals:   04/28/12 1434  BP: 142/90  Pulse: 119  Resp: 18  Height: 6\' 1"  (1.854 m)  Weight: 242 lb 12.8 oz (110.133 kg)  SpO2: 97%    PHYSICAL EXAM: Well nourished, well developed, in no acute distress HEENT: normal Neck: Thick. JVP flat Cardiac:  normal S1, S2; RRR; no murmur, no gallops Lungs:  clear to auscultation bilaterally, no wheezing, rhonchi or rales Abd: obese, soft, nontender, no hepatomegaly, nondistended Ext: no edema;  Skin: warm and  dry, scattered papular rash on upper chest and shoulders with minimal excoriation; no burrows noted Neuro:  CNs 2-12 intact, no focal abnormalities noted  ASSESSMENT AND PLAN:

## 2012-05-08 ENCOUNTER — Telehealth (HOSPITAL_COMMUNITY): Payer: Self-pay | Admitting: *Deleted

## 2012-05-08 NOTE — Telephone Encounter (Signed)
Received signed consent from [facility] in [place], pt has moved there and they are requesting his records, ov notes, echos, labs and RHC faxed to them at [phone]

## 2013-01-03 IMAGING — CT CT ABD-PELV W/ CM
2 of 4 series · 13 of 32 positions shown, 18 images · IV contrast (water & 100ml omni 300)
Comparison: 10/12/2010

CLINICAL DATA: Abdominal pain with nausea vomiting and diarrhea.

CT ABDOMEN AND PELVIS WITH CONTRAST
TECHNIQUE: Multidetector CT imaging of the abdomen and pelvis was
performed following the standard protocol during bolus
administration of intravenous contrast.
Contrast: 100 ml [id number] IV

[Series 2: routine abdomen · axial · 0.85mm/px · z∈[-389,-109]mm · 5 of 86 slices shown, 10 images]
[im 15/86  soft-tissue]
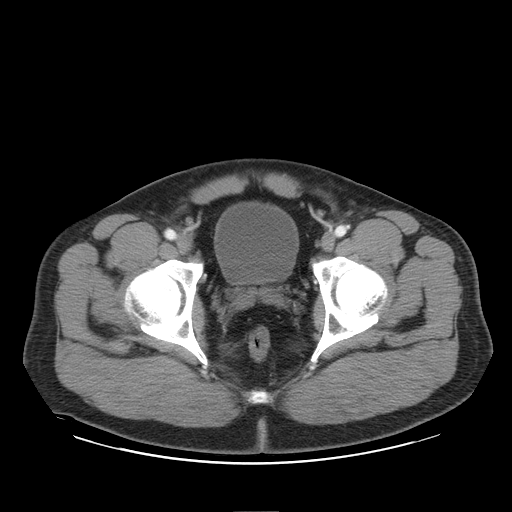
[im 15/86  bone]
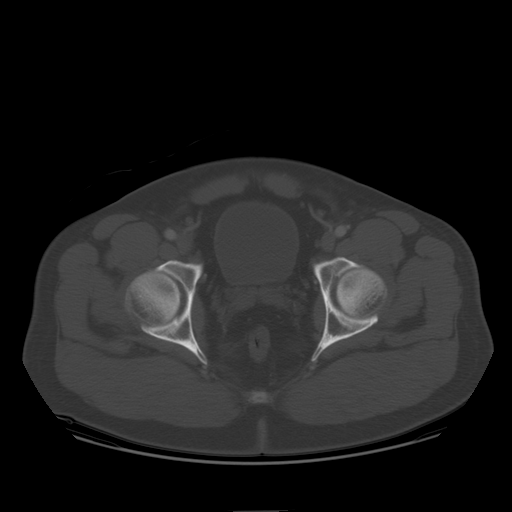
[im 29/86  soft-tissue]
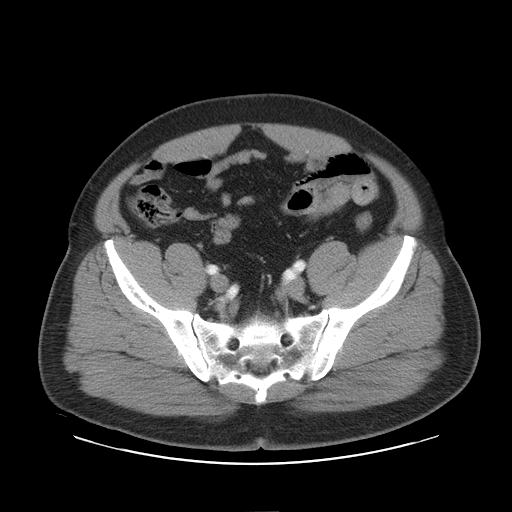
[im 29/86  lung]
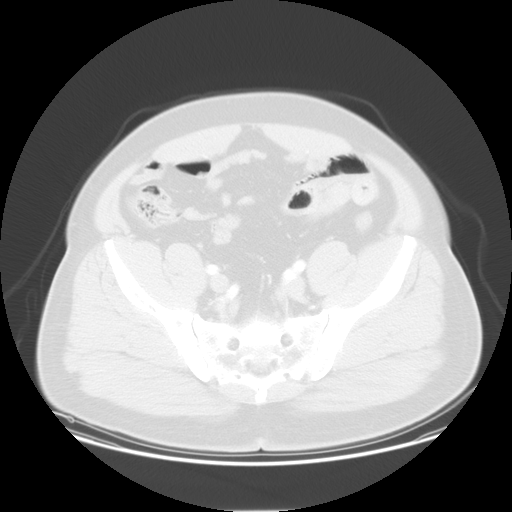
[im 43/86  soft-tissue]
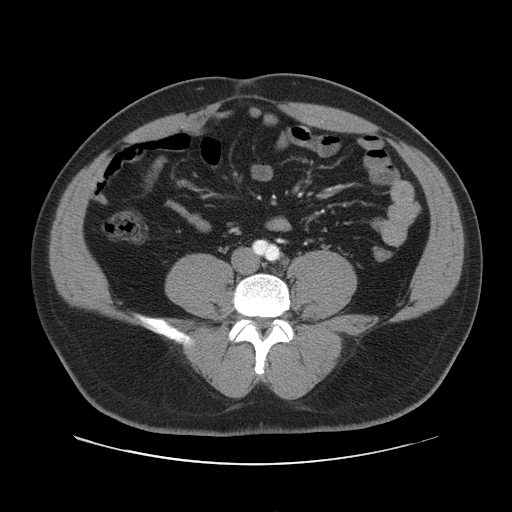
[im 43/86  lung]
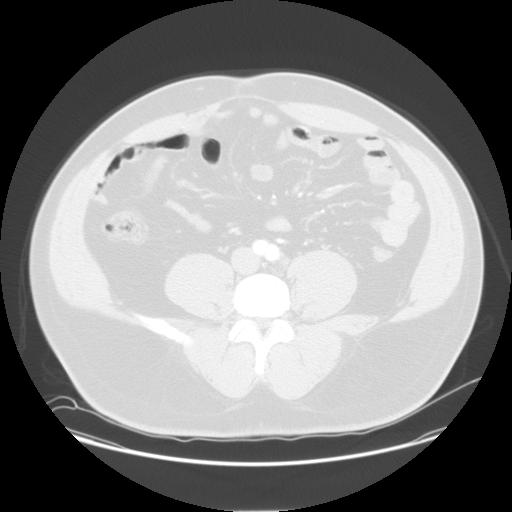
[im 57/86  soft-tissue]
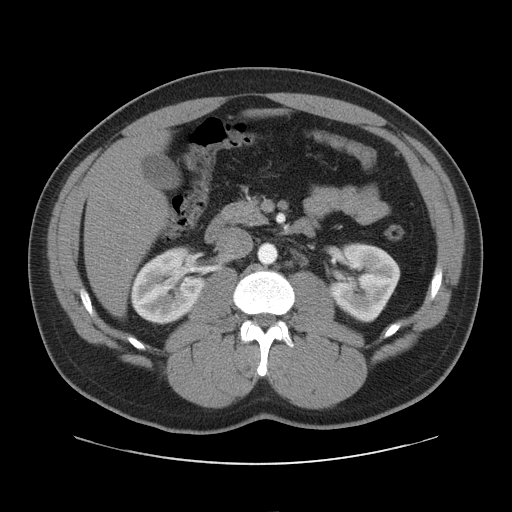
[im 57/86  lung]
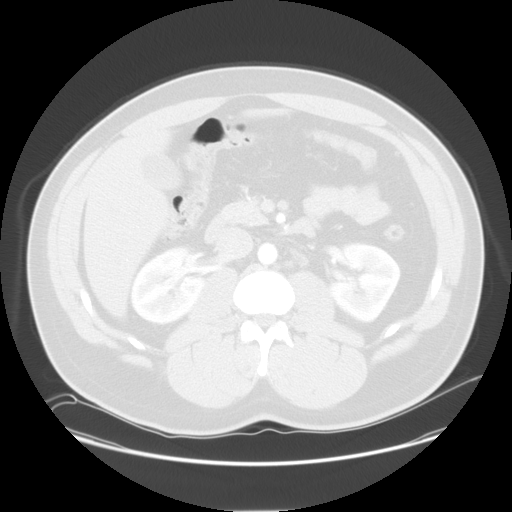
[im 71/86  soft-tissue]
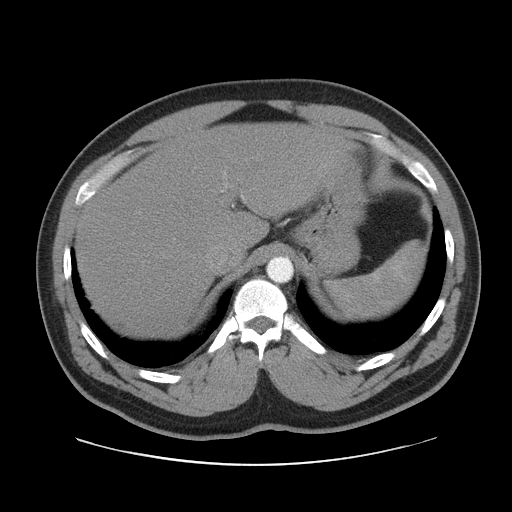
[im 71/86  lung]
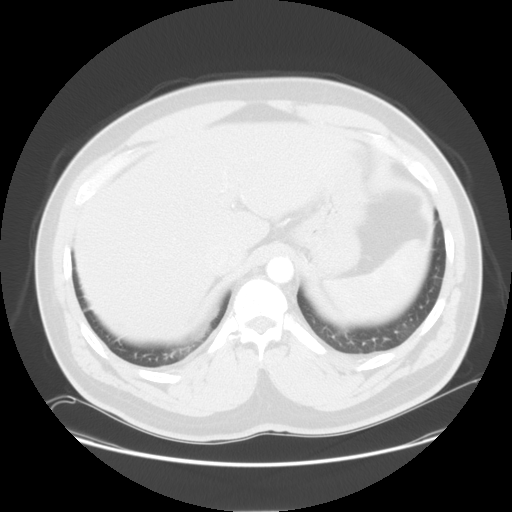

[Series 400: sagittals · sagittal · 0.88mm/px · 8 of 130 slices shown]
[im 12/130  soft-tissue]
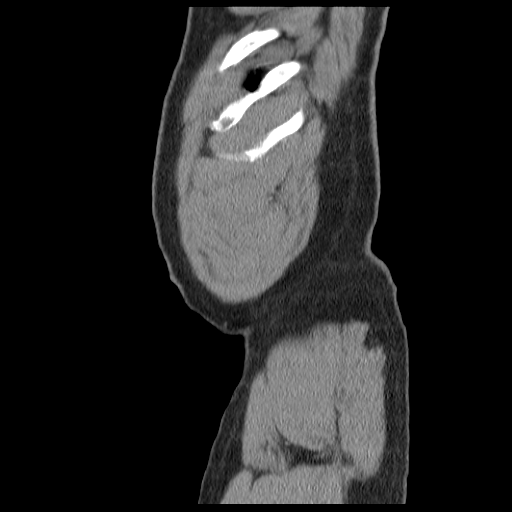
[im 24/130  soft-tissue]
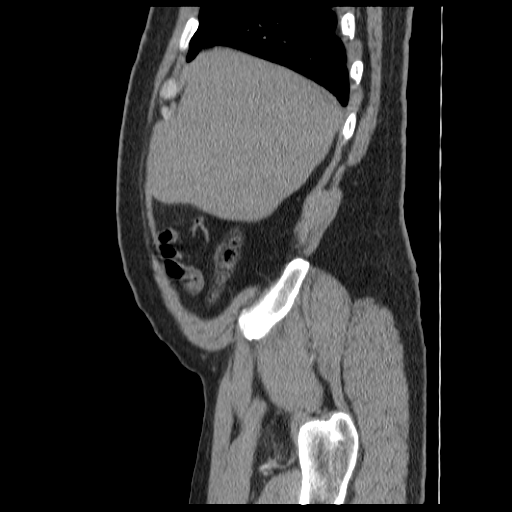
[im 47/130  soft-tissue]
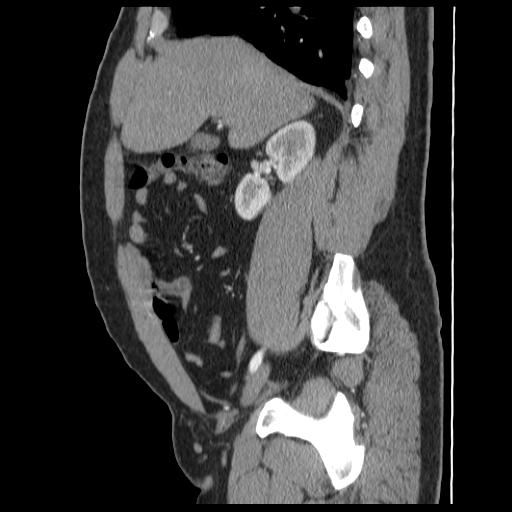
[im 59/130  soft-tissue]
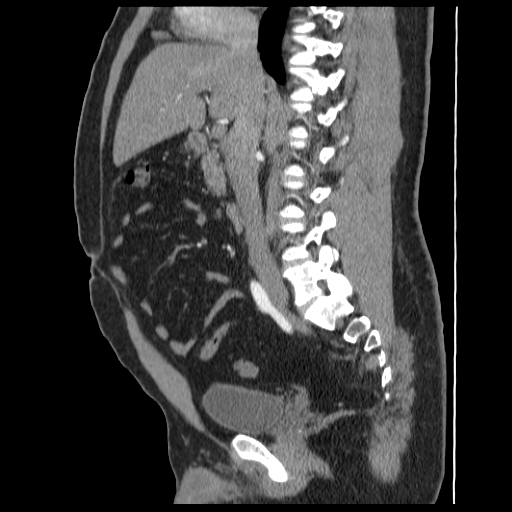
[im 71/130  soft-tissue]
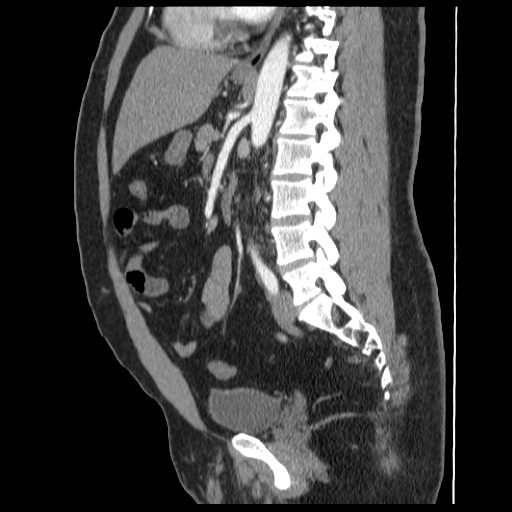
[im 83/130  soft-tissue]
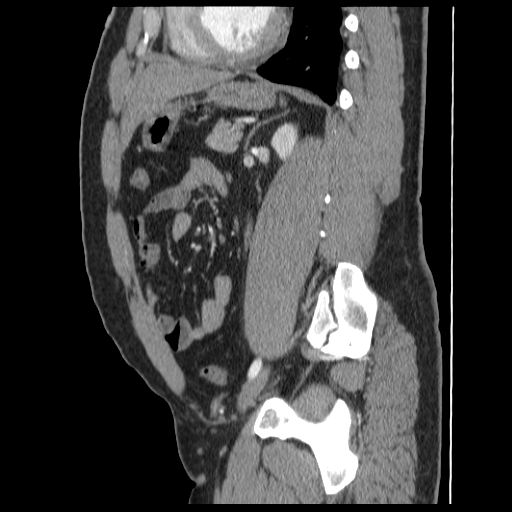
[im 106/130  soft-tissue]
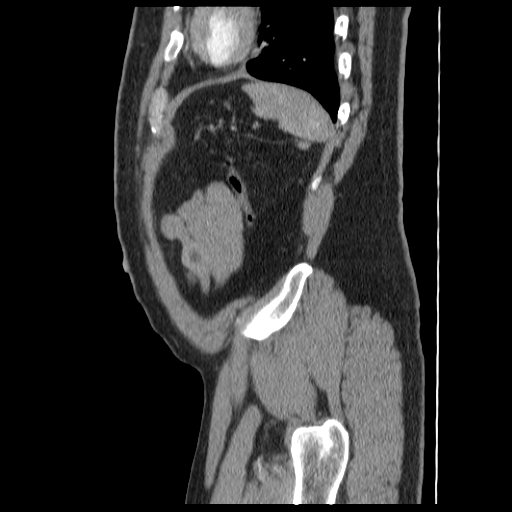
[im 118/130  soft-tissue]
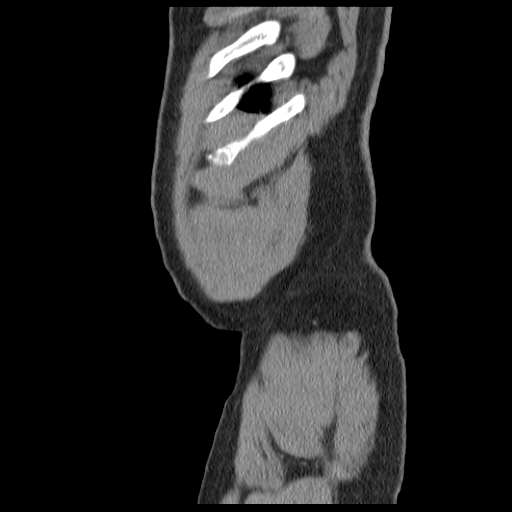

[13 of 32 positions shown; findings below may reference images not displayed]

FINDINGS: Lung bases are clear.  Heart is enlarged without
pericardial effusion.

Liver and bile ducts and gallbladder normal.  Pancreas and spleen
are normal.  Kidneys are normal without obstruction or mass.

Negative for bowel obstruction. Mild thickening of the right colon
may be related to infectious colitis or inflammatory bowel disease.
Terminal ileum is normal.  Sigmoid diverticulosis is present.  The
appendix is normal.  Aorta is normal.  No acute bony abnormality.
IMPRESSION: Mild edema in the right colon which may be due to infectious
colitis or inflammatory bowel disease.  Terminal ileum is normal.

## 2013-01-03 IMAGING — CR DG ABDOMEN ACUTE W/ 1V CHEST
3 series · 3 of 3 positions shown · non-contrast
Comparison: None.

CLINICAL DATA: Nausea, vomiting, abdominal pain and shortness of
breath

ACUTE ABDOMEN SERIES (ABDOMEN 2 VIEW & CHEST 1 VIEW)

[w chest pa]
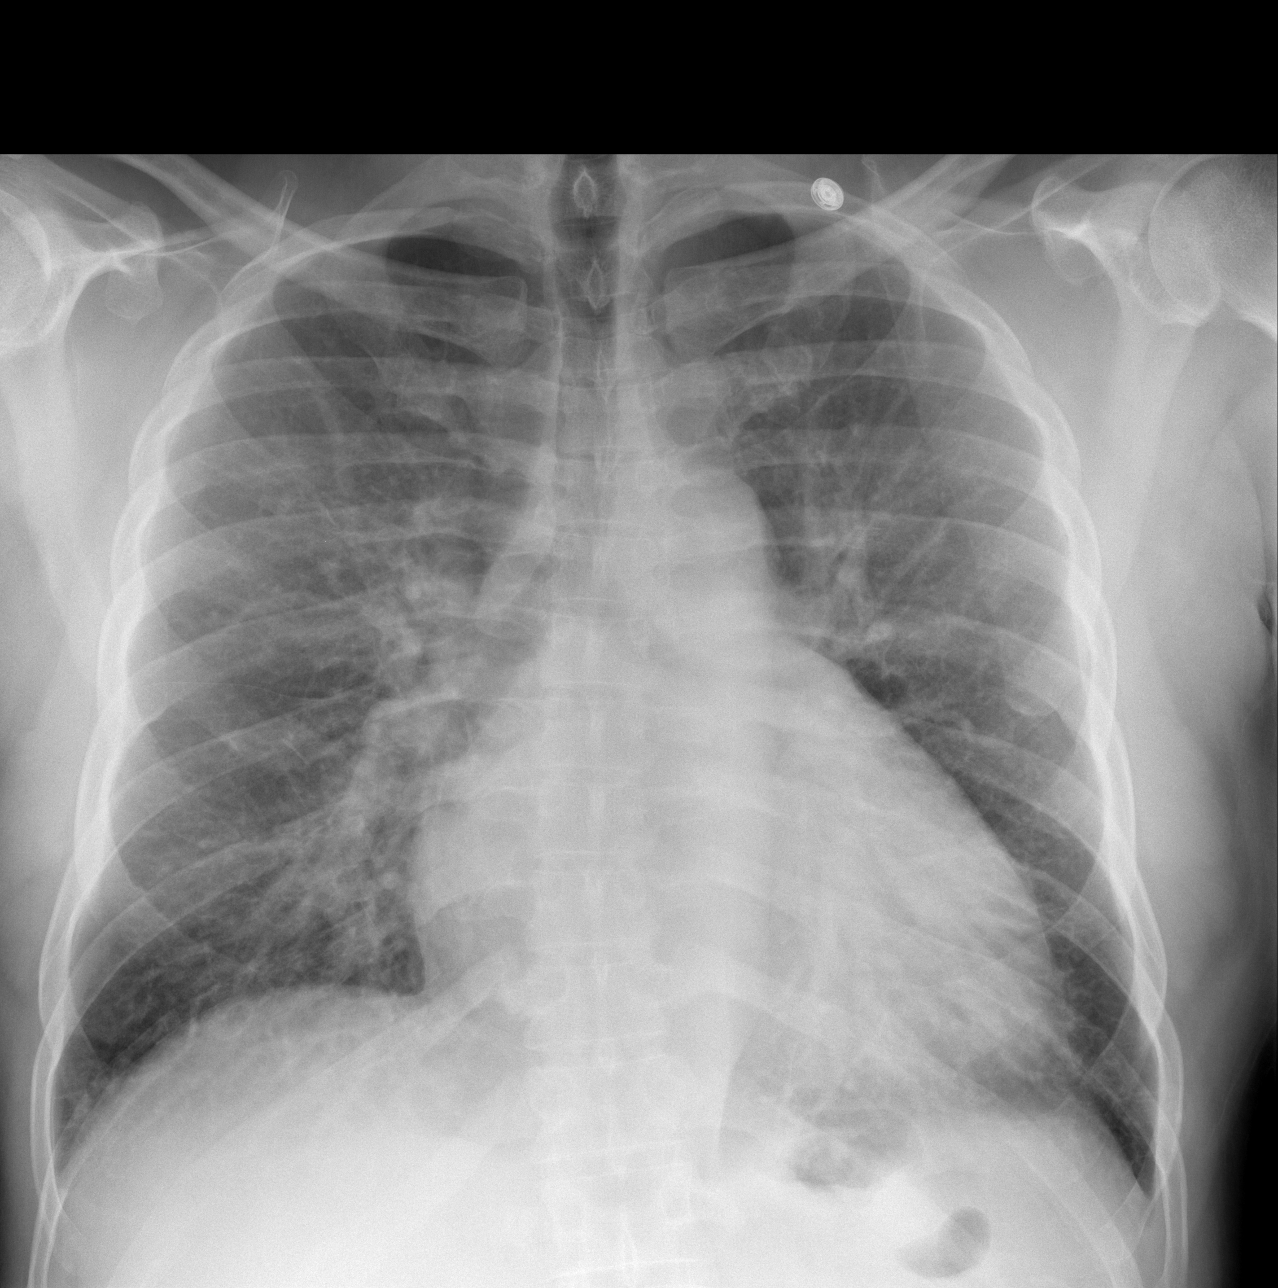

[w abdomen upright]
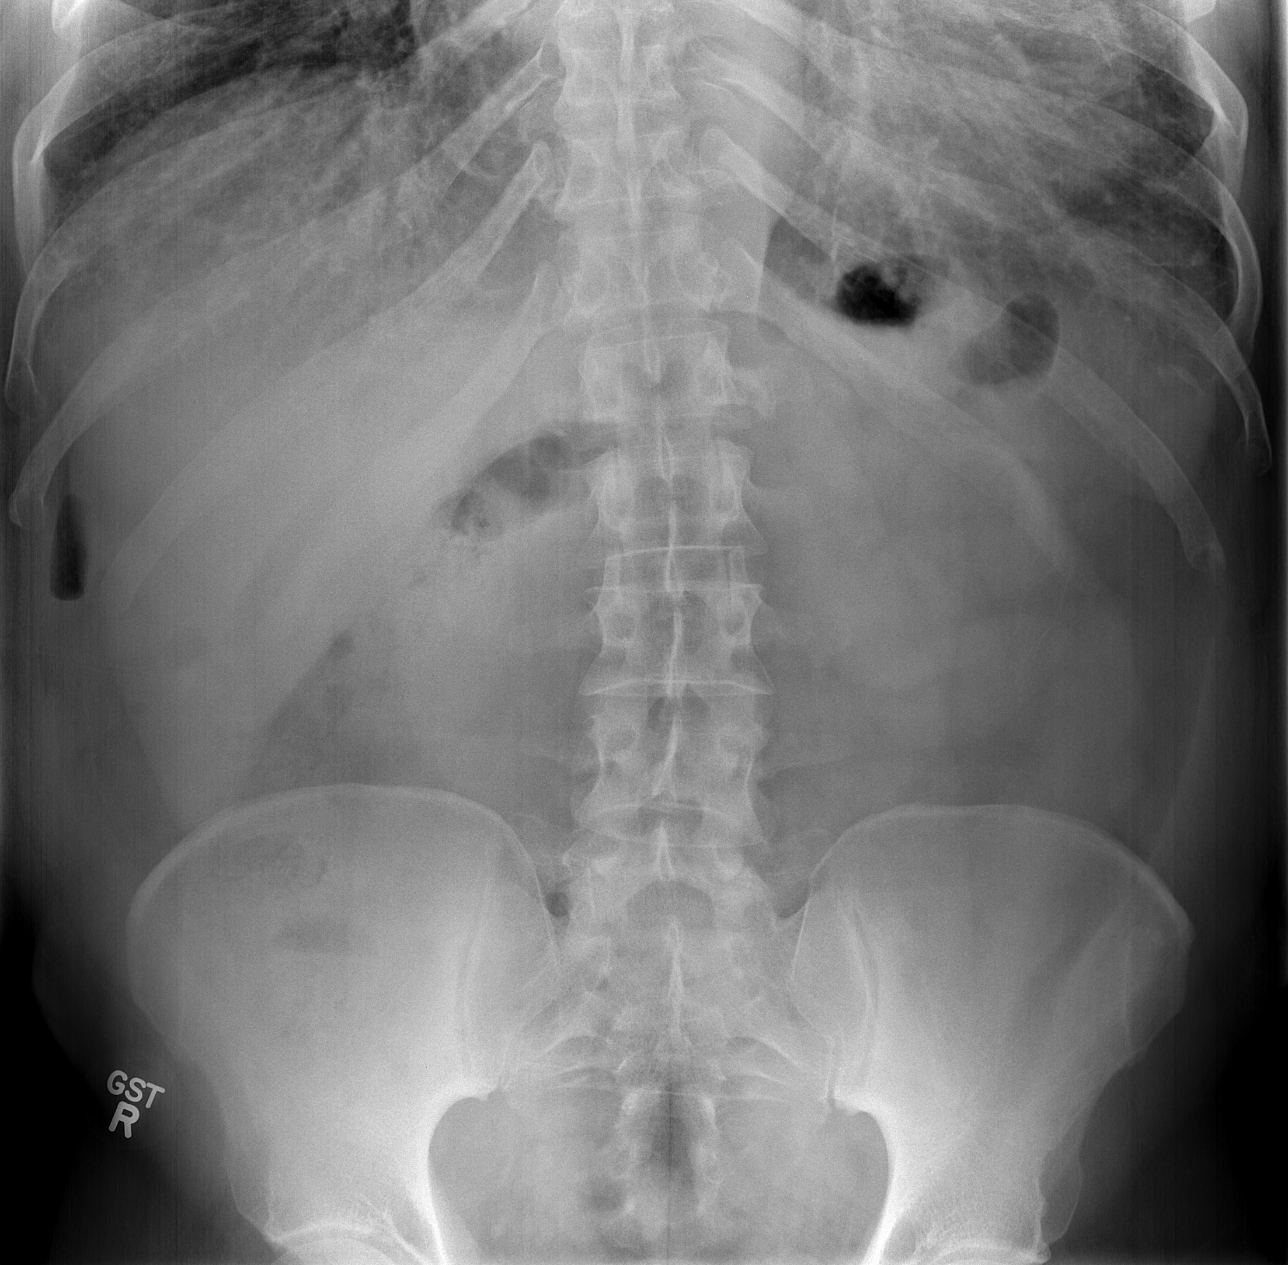

[t abdomen supine]
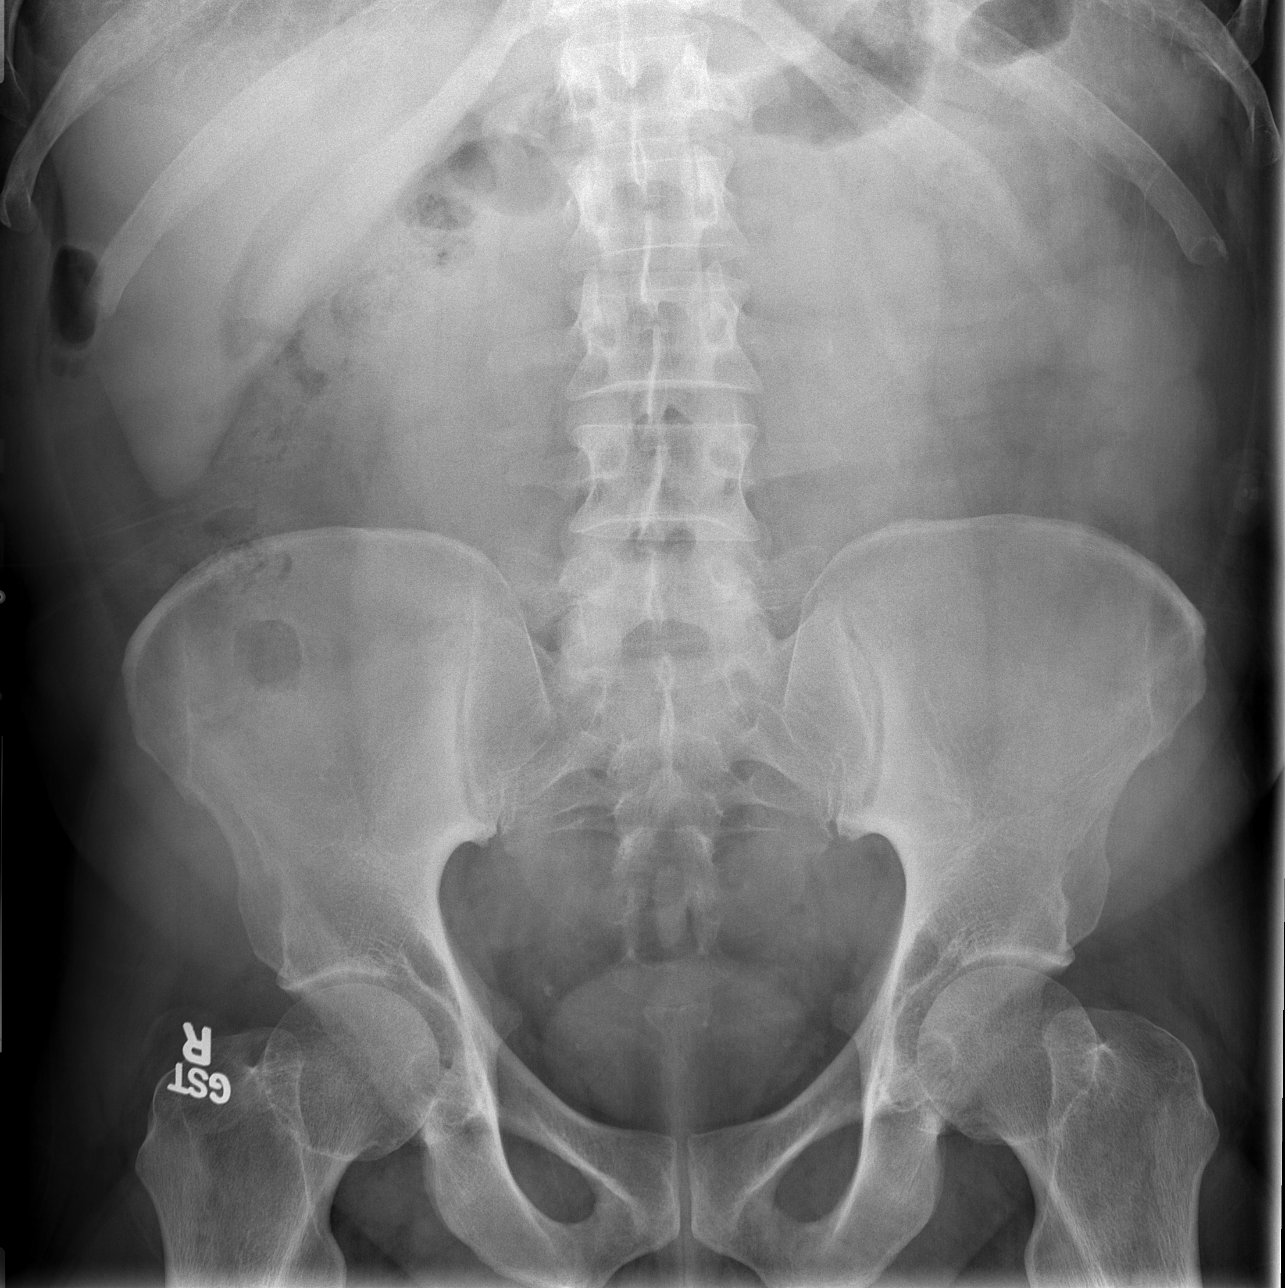

[3 of 3 positions shown; findings below may reference images not displayed]

FINDINGS: Cardiac enlargement with prominent pulmonary vascularity
and interstitial changes in the lung bases suggesting congestive
failure/fluid overload with interstitial edema.  No focal
consolidation.   No blunting of costophrenic angles.

Scattered gas and stool in the colon.  No small or large bowel
dilatation. Small gas collection in the right upper quadrant
adjacent to the lateral liver edge is probably in the hepatic
flexure.  No free intra-abdominal air.  No abnormal air fluid
levels.  No radiopaque stones.
IMPRESSION: Suggestion of mild congestive changes in the heart and lungs with
slight interstitial edema.  Nonobstructive bowel gas pattern.

## 2013-03-19 ENCOUNTER — Telehealth (HOSPITAL_COMMUNITY): Payer: Self-pay | Admitting: *Deleted

## 2013-03-19 DIAGNOSIS — I5022 Chronic systolic (congestive) heart failure: Secondary | ICD-10-CM

## 2013-03-19 NOTE — Telephone Encounter (Signed)
Pt called and is requesting echo prior to appt, he reports last echo was in may in [place], ok per Dr Gala Romney, order placed, echo sch

## 2013-03-20 ENCOUNTER — Ambulatory Visit (HOSPITAL_COMMUNITY)
Admission: RE | Admit: 2013-03-20 | Discharge: 2013-03-20 | Disposition: A | Discharge status: Home / Self Care | Payer: Self-pay | Source: Ambulatory Visit | Attending: Internal Medicine | Admitting: Internal Medicine

## 2013-03-20 VITALS — BP 124/86 | HR 83 | Wt 236.0 lb

## 2013-03-20 DIAGNOSIS — E119 Type 2 diabetes mellitus without complications: Secondary | ICD-10-CM | POA: Insufficient documentation

## 2013-03-20 DIAGNOSIS — I5022 Chronic systolic (congestive) heart failure: Secondary | ICD-10-CM

## 2013-03-20 DIAGNOSIS — I517 Cardiomegaly: Secondary | ICD-10-CM

## 2013-03-20 NOTE — Progress Notes (Signed)
Patient ID: Tyrone Walker, male   DOB: 1976-05-14, 37 y.o.   MRN: [id number] History of Present Illness: Primary Cardiologist:  Dr. Arvilla Meres  Fahim Kats is a 37 y.o. 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  Cath 11/2010 showed normal cors.  He had serology done to evaluate etiology of his myopathy.  All titers were negative except for Coxsackie B2 titer was elevated.  Recently diagnosed with PAF and DM2.   He had CPX 02/05/11: pVO2 23.6 (71%)  Ve/VCO2 27 O2 pulse 80% RER 1.25 VE/MVV 56%  Echo 06/2011: LVEF 35-40%, diffuse hypokinesis.  Grade 1 diastolic dysfunction.   01/24/12: LVEF 30-35%, grade 1 diastolic dysfunction.  11/13: ECHO EF 50-55%  03/20/13: EF 30%   He returns for follow up today. He had moved to [place] and was told he needs an ICD. He wants a second opinion so came back today. Feels great. Able to ride his bike 20 miles. Without problem . He denies SOB/orthopnea/PND.  No edema.  Compliant with meds.  Wore heart monitor with no arrythmias.   Past Medical History  Diagnosis Date  . history Marijuana smoker   . Elevated LFTs     likely hepatic congestion from CHF  . Colitis 10/12/10    ER visit  . 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 cath 11/22/10: normal cors; EF 15-20%;  b. echo 11/19/10: EF 20%, mild MR, mild LAE, PASP 48; c. dx 5/12, tx with IV milrinone for low output, Coxsackie B2 titer elevated; o/w HIV, CMV, RMSF, ANA, RF, ESR, ACE all normal  . Chronic systolic heart failure   . Hypertension 10/18/2011    Current Outpatient Prescriptions  Medication Sig Dispense Refill  . aspirin 81 MG tablet Take 81 mg by mouth daily.      . carvedilol (COREG) 25 MG tablet Take 37.5 mg by mouth 2 (two) times daily with a meal.      . cetirizine (ZYRTEC) 10 MG tablet Take 1 tablet (10 mg total) by mouth daily.  30 tablet  11  . dabigatran (PRADAXA) 75 MG CAPS capsule Take 75 mg by  mouth every 12 (twelve) hours.      . furosemide (LASIX) 40 MG tablet Take 1 tablet (40 mg total) by mouth daily.  30 tablet  6  . glyBURIDE (DIABETA) 5 MG tablet Take 5 mg by mouth 2 (two) times daily with a meal.      . hydrALAZINE (APRESOLINE) 25 MG tablet Take 50 mg by mouth 3 (three) times daily.      . isosorbide dinitrate (ISORDIL) 10 MG tablet Take 10 mg by mouth 2 (two) times daily.      Marland Kitchen lisinopril (PRINIVIL,ZESTRIL) 20 MG tablet Take 1 tablet (20 mg total) by mouth 2 (two) times daily.  60 tablet  6  . metFORMIN (GLUCOPHAGE) 500 MG tablet Take 500 mg by mouth 2 (two) times daily with a meal.      . spironolactone (ALDACTONE) 25 MG tablet Take 1 tablet (25 mg total) by mouth daily.  30 tablet  6   No current facility-administered medications for this encounter.    Allergies: No Known Allergies   ROS:  All pertinent positives and negatives as in HPI, otherwise negative  Vital Signs: Filed Vitals:   03/20/13 1023  BP: 124/86  Pulse: 83  Weight: 236 lb (107.049 kg)  SpO2: 98%  PHYSICAL EXAM: Well nourished, well developed, in no acute distress HEENT: normal Neck: Thick. JVP flat Cardiac:  normal S1, S2; RRR; no murmur, no gallops Lungs:  clear to auscultation bilaterally, no wheezing, rhonchi or rales Abd: obese, soft, nontender, no hepatomegaly, nondistended Ext: no edema;  Skin: warm and dry, scattered papular rash on upper chest and shoulders with minimal excoriation; no burrows noted Neuro:  CNs 2-12 intact, no focal abnormalities noted  ASSESSMENT AND PLAN:  1. Chronic systolic HF due to NICM --Echo reviewed today and EF back down to 30% --Currently appears to be NYHA I. Volume status looks good. --We discussed role of ICD. However he will not qualify if truly NYHA I --Will get CPX test to objectively assess functional capacity and MRI to evaluate EF and then can decide further about ICD --Continue current meds  2. DM2 --needs statin. start crestor  10.  3. OSA --continue CPAP.  4. PAF --switch Pradaxa to Eliquis 5 bid  Daniel Bensimhon,MD 10:55 AM

## 2013-03-20 NOTE — Addendum Note (Signed)
Encounter addended by: Theresia Bough, CMA on: 03/20/2013 11:26 AM<BR>     Documentation filed: Orders

## 2013-03-20 NOTE — Progress Notes (Signed)
  Echocardiogram 2D Echocardiogram has been performed.  Georgian Co 03/20/2013, 10:18 AM

## 2013-04-02 ENCOUNTER — Encounter (HOSPITAL_COMMUNITY): Payer: Self-pay

## 2013-04-02 ENCOUNTER — Ambulatory Visit (HOSPITAL_COMMUNITY): Payer: Self-pay | Attending: Internal Medicine

## 2013-04-02 DIAGNOSIS — I5022 Chronic systolic (congestive) heart failure: Secondary | ICD-10-CM

## 2013-04-02 DIAGNOSIS — I509 Heart failure, unspecified: Secondary | ICD-10-CM | POA: Insufficient documentation

## 2013-04-07 ENCOUNTER — Telehealth (HOSPITAL_COMMUNITY): Payer: Self-pay | Admitting: Cardiology

## 2013-04-07 ENCOUNTER — Encounter: Payer: Self-pay | Admitting: Internal Medicine

## 2013-04-07 DIAGNOSIS — R0902 Hypoxemia: Secondary | ICD-10-CM

## 2013-04-07 NOTE — Telephone Encounter (Signed)
Message copied by Malakhai Beitler, Milagros Reap on Tue Apr 07, 2013  9:31 AM ------      Message from: Dolores Patty      Created: Mon Apr 06, 2013  1:03 PM       No evidence of HF limitation. Can hold on ICD for now. Mild O2 desaturation with exercise. Please get full PFTs with [facility] when he is back in town. ------

## 2013-04-07 NOTE — Telephone Encounter (Signed)
Attempting to contact pt regarding CPX results

## 2013-04-07 NOTE — Telephone Encounter (Signed)
Pt aware Will get pft and dlco While he is having mri

## 2013-05-19 ENCOUNTER — Ambulatory Visit (HOSPITAL_COMMUNITY): Payer: Self-pay

## 2013-05-19 ENCOUNTER — Encounter (HOSPITAL_COMMUNITY): Payer: Self-pay

## 2017-02-11 ENCOUNTER — Emergency Department (HOSPITAL_COMMUNITY): Payer: Medicaid - Out of State

## 2017-02-11 ENCOUNTER — Encounter (HOSPITAL_COMMUNITY): Payer: Self-pay | Admitting: Emergency Medicine

## 2017-02-11 ENCOUNTER — Inpatient Hospital Stay (HOSPITAL_COMMUNITY)
Admission: EM | Admit: 2017-02-11 | Discharge: 2017-02-14 | DRG: 292 | Disposition: A | Discharge status: Home / Self Care | Payer: Medicaid - Out of State | Attending: Internal Medicine | Admitting: Internal Medicine

## 2017-02-11 DIAGNOSIS — R0602 Shortness of breath: Secondary | ICD-10-CM

## 2017-02-11 DIAGNOSIS — N179 Acute kidney failure, unspecified: Secondary | ICD-10-CM | POA: Diagnosis present

## 2017-02-11 DIAGNOSIS — Z9114 Patient's other noncompliance with medication regimen: Secondary | ICD-10-CM

## 2017-02-11 DIAGNOSIS — I10 Essential (primary) hypertension: Secondary | ICD-10-CM | POA: Diagnosis present

## 2017-02-11 DIAGNOSIS — Z9989 Dependence on other enabling machines and devices: Secondary | ICD-10-CM

## 2017-02-11 DIAGNOSIS — E1159 Type 2 diabetes mellitus with other circulatory complications: Secondary | ICD-10-CM

## 2017-02-11 DIAGNOSIS — I110 Hypertensive heart disease with heart failure: Principal | ICD-10-CM | POA: Diagnosis present

## 2017-02-11 DIAGNOSIS — E118 Type 2 diabetes mellitus with unspecified complications: Secondary | ICD-10-CM

## 2017-02-11 DIAGNOSIS — E876 Hypokalemia: Secondary | ICD-10-CM | POA: Diagnosis present

## 2017-02-11 DIAGNOSIS — I428 Other cardiomyopathies: Secondary | ICD-10-CM | POA: Diagnosis present

## 2017-02-11 DIAGNOSIS — Z9119 Patient's noncompliance with other medical treatment and regimen: Secondary | ICD-10-CM

## 2017-02-11 DIAGNOSIS — I5043 Acute on chronic combined systolic (congestive) and diastolic (congestive) heart failure: Secondary | ICD-10-CM | POA: Diagnosis present

## 2017-02-11 DIAGNOSIS — I5023 Acute on chronic systolic (congestive) heart failure: Secondary | ICD-10-CM

## 2017-02-11 DIAGNOSIS — T502X5A Adverse effect of carbonic-anhydrase inhibitors, benzothiadiazides and other diuretics, initial encounter: Secondary | ICD-10-CM | POA: Diagnosis present

## 2017-02-11 DIAGNOSIS — I509 Heart failure, unspecified: Secondary | ICD-10-CM

## 2017-02-11 DIAGNOSIS — I5033 Acute on chronic diastolic (congestive) heart failure: Secondary | ICD-10-CM | POA: Diagnosis present

## 2017-02-11 DIAGNOSIS — R000 Tachycardia, unspecified: Secondary | ICD-10-CM | POA: Diagnosis present

## 2017-02-11 DIAGNOSIS — Z79899 Other long term (current) drug therapy: Secondary | ICD-10-CM

## 2017-02-11 DIAGNOSIS — R0789 Other chest pain: Secondary | ICD-10-CM | POA: Diagnosis present

## 2017-02-11 DIAGNOSIS — Z7901 Long term (current) use of anticoagulants: Secondary | ICD-10-CM

## 2017-02-11 DIAGNOSIS — I5022 Chronic systolic (congestive) heart failure: Secondary | ICD-10-CM | POA: Diagnosis present

## 2017-02-11 DIAGNOSIS — E119 Type 2 diabetes mellitus without complications: Secondary | ICD-10-CM | POA: Diagnosis present

## 2017-02-11 DIAGNOSIS — Z9111 Patient's noncompliance with dietary regimen: Secondary | ICD-10-CM

## 2017-02-11 DIAGNOSIS — G4733 Obstructive sleep apnea (adult) (pediatric): Secondary | ICD-10-CM | POA: Diagnosis present

## 2017-02-11 DIAGNOSIS — J9811 Atelectasis: Secondary | ICD-10-CM | POA: Diagnosis present

## 2017-02-11 DIAGNOSIS — I340 Nonrheumatic mitral (valve) insufficiency: Secondary | ICD-10-CM | POA: Diagnosis present

## 2017-02-11 DIAGNOSIS — I480 Paroxysmal atrial fibrillation: Secondary | ICD-10-CM | POA: Diagnosis present

## 2017-02-11 DIAGNOSIS — T464X5A Adverse effect of angiotensin-converting-enzyme inhibitors, initial encounter: Secondary | ICD-10-CM | POA: Diagnosis present

## 2017-02-11 HISTORY — DX: Acute on chronic systolic (congestive) heart failure: I50.23

## 2017-02-11 HISTORY — DX: Nonrheumatic mitral (valve) insufficiency: I34.0

## 2017-02-11 LAB — CBC
HEMATOCRIT: 37.2 % — AB (ref 39.0–52.0)
Hemoglobin: 12.2 g/dL — ABNORMAL LOW (ref 13.0–17.0)
MCH: 28.2 pg (ref 26.0–34.0)
MCHC: 32.8 g/dL (ref 30.0–36.0)
MCV: 86.1 fL (ref 78.0–100.0)
Platelets: 170 10*3/uL (ref 150–400)
RBC: 4.32 MIL/uL (ref 4.22–5.81)
RDW: 13 % (ref 11.5–15.5)
WBC: 6.5 10*3/uL (ref 4.0–10.5)

## 2017-02-11 LAB — GLUCOSE, CAPILLARY
GLUCOSE-CAPILLARY: 188 mg/dL — AB (ref 65–99)
Glucose-Capillary: 140 mg/dL — ABNORMAL HIGH (ref 65–99)

## 2017-02-11 LAB — BASIC METABOLIC PANEL
ANION GAP: 8 (ref 5–15)
BUN: 14 mg/dL (ref 6–20)
CO2: 27 mmol/L (ref 22–32)
Calcium: 8.5 mg/dL — ABNORMAL LOW (ref 8.9–10.3)
Chloride: 103 mmol/L (ref 101–111)
Creatinine, Ser: 1.33 mg/dL — ABNORMAL HIGH (ref 0.61–1.24)
Glucose, Bld: 198 mg/dL — ABNORMAL HIGH (ref 65–99)
Potassium: 3.2 mmol/L — ABNORMAL LOW (ref 3.5–5.1)
Sodium: 138 mmol/L (ref 135–145)

## 2017-02-11 LAB — TSH: TSH: 1.049 u[IU]/mL (ref 0.350–4.500)

## 2017-02-11 LAB — I-STAT TROPONIN, ED: Troponin i, poc: 0 ng/mL (ref 0.00–0.08)

## 2017-02-11 LAB — HEPATIC FUNCTION PANEL
ALBUMIN: 3.7 g/dL (ref 3.5–5.0)
ALT: 28 U/L (ref 17–63)
AST: 31 U/L (ref 15–41)
Alkaline Phosphatase: 39 U/L (ref 38–126)
Bilirubin, Direct: 0.2 mg/dL (ref 0.1–0.5)
Indirect Bilirubin: 0.9 mg/dL (ref 0.3–0.9)
TOTAL PROTEIN: 6.7 g/dL (ref 6.5–8.1)
Total Bilirubin: 1.1 mg/dL (ref 0.3–1.2)

## 2017-02-11 LAB — MAGNESIUM: MAGNESIUM: 1.9 mg/dL (ref 1.7–2.4)

## 2017-02-11 LAB — BRAIN NATRIURETIC PEPTIDE: B Natriuretic Peptide: 551.1 pg/mL — ABNORMAL HIGH (ref 0.0–100.0)

## 2017-02-11 LAB — HEMOGLOBIN A1C
HEMOGLOBIN A1C: 11.5 % — AB (ref 4.8–5.6)
Mean Plasma Glucose: 283.35 mg/dL

## 2017-02-11 LAB — LIPASE, BLOOD: LIPASE: 19 U/L (ref 11–51)

## 2017-02-11 LAB — CBG MONITORING, ED
Glucose-Capillary: 202 mg/dL — ABNORMAL HIGH (ref 65–99)
Glucose-Capillary: 168 mg/dL — ABNORMAL HIGH (ref 65–99)
Glucose-Capillary: 144 mg/dL — ABNORMAL HIGH (ref 65–99)

## 2017-02-11 MED ORDER — ONDANSETRON HCL 4 MG/2ML IJ SOLN
4.0000 mg | Freq: Four times a day (QID) | INTRAMUSCULAR | Status: DC | PRN
  Administered 2017-02-11 (×2): 4 mg via INTRAVENOUS
  Filled 2017-02-11 (×3): qty 2

## 2017-02-11 MED ORDER — INSULIN ASPART 100 UNIT/ML ~~LOC~~ SOLN
0.0000 [IU] | Freq: Three times a day (TID) | SUBCUTANEOUS | Status: DC
  Administered 2017-02-11: 1 [IU] via SUBCUTANEOUS
  Administered 2017-02-11: 3 [IU] via SUBCUTANEOUS
  Administered 2017-02-12: 2 [IU] via SUBCUTANEOUS
  Administered 2017-02-12: 3 [IU] via SUBCUTANEOUS
  Administered 2017-02-13: 1 [IU] via SUBCUTANEOUS
  Administered 2017-02-13: 3 [IU] via SUBCUTANEOUS
  Administered 2017-02-13: 2 [IU] via SUBCUTANEOUS
  Administered 2017-02-14: 5 [IU] via SUBCUTANEOUS
  Administered 2017-02-14: 2 [IU] via SUBCUTANEOUS
  Administered 2017-02-14: 3 [IU] via SUBCUTANEOUS
  Filled 2017-02-11 (×4): qty 1

## 2017-02-11 MED ORDER — DIGOXIN 125 MCG PO TABS
0.1250 mg | ORAL_TABLET | Freq: Every day | ORAL | Status: DC
  Administered 2017-02-11 – 2017-02-14 (×4): 0.125 mg via ORAL
  Filled 2017-02-11 (×4): qty 1

## 2017-02-11 MED ORDER — SACUBITRIL-VALSARTAN 24-26 MG PO TABS
1.0000 | ORAL_TABLET | Freq: Two times a day (BID) | ORAL | Status: DC
  Administered 2017-02-13 – 2017-02-14 (×3): 1 via ORAL
  Filled 2017-02-11 (×4): qty 1

## 2017-02-11 MED ORDER — SPIRONOLACTONE 25 MG PO TABS
25.0000 mg | ORAL_TABLET | Freq: Every day | ORAL | Status: DC
  Administered 2017-02-11 – 2017-02-14 (×4): 25 mg via ORAL
  Filled 2017-02-11 (×5): qty 1

## 2017-02-11 MED ORDER — ACETAMINOPHEN 325 MG PO TABS
650.0000 mg | ORAL_TABLET | ORAL | Status: DC | PRN
  Administered 2017-02-14: 650 mg via ORAL
  Filled 2017-02-11: qty 2

## 2017-02-11 MED ORDER — SODIUM CHLORIDE 0.9 % IV SOLN: 250.0000 mL | INTRAVENOUS | Status: DC | PRN

## 2017-02-11 MED ORDER — FUROSEMIDE 40 MG PO TABS
40.0000 mg | ORAL_TABLET | Freq: Every day | ORAL | Status: DC
  Administered 2017-02-12: 40 mg via ORAL
  Filled 2017-02-11: qty 1

## 2017-02-11 MED ORDER — MORPHINE SULFATE (PF) 4 MG/ML IV SOLN
4.0000 mg | Freq: Once | INTRAVENOUS | Status: AC
  Administered 2017-02-11: 4 mg via INTRAVENOUS
  Filled 2017-02-11: qty 1

## 2017-02-11 MED ORDER — FUROSEMIDE 10 MG/ML IJ SOLN
40.0000 mg | Freq: Once | INTRAMUSCULAR | Status: AC
  Administered 2017-02-11: 40 mg via INTRAVENOUS
  Filled 2017-02-11: qty 4

## 2017-02-11 MED ORDER — METOPROLOL SUCCINATE ER 100 MG PO TB24
100.0000 mg | ORAL_TABLET | Freq: Two times a day (BID) | ORAL | Status: DC
  Administered 2017-02-11 – 2017-02-14 (×7): 100 mg via ORAL
  Filled 2017-02-11 (×2): qty 1
  Filled 2017-02-11: qty 4
  Filled 2017-02-11 (×6): qty 1

## 2017-02-11 MED ORDER — METOPROLOL SUCCINATE ER 100 MG PO TB24: 100.0000 mg | ORAL_TABLET | Freq: Every day | ORAL | Status: DC

## 2017-02-11 MED ORDER — POTASSIUM CHLORIDE CRYS ER 20 MEQ PO TBCR
40.0000 meq | EXTENDED_RELEASE_TABLET | Freq: Once | ORAL | Status: AC
  Administered 2017-02-11: 40 meq via ORAL
  Filled 2017-02-11: qty 2

## 2017-02-11 MED ORDER — FUROSEMIDE 20 MG PO TABS: 40.0000 mg | ORAL_TABLET | Freq: Every day | ORAL | Status: DC

## 2017-02-11 MED ORDER — RIVAROXABAN 20 MG PO TABS
20.0000 mg | ORAL_TABLET | Freq: Every day | ORAL | Status: DC
  Administered 2017-02-11 – 2017-02-14 (×4): 20 mg via ORAL
  Filled 2017-02-11 (×5): qty 1

## 2017-02-11 MED ORDER — SODIUM CHLORIDE 0.9% FLUSH
3.0000 mL | Freq: Two times a day (BID) | INTRAVENOUS | Status: DC
  Administered 2017-02-11 – 2017-02-14 (×7): 3 mL via INTRAVENOUS

## 2017-02-11 MED ORDER — ALPRAZOLAM 0.25 MG PO TABS
0.2500 mg | ORAL_TABLET | Freq: Two times a day (BID) | ORAL | Status: DC | PRN
  Administered 2017-02-11: 0.25 mg via ORAL
  Filled 2017-02-11: qty 1

## 2017-02-11 NOTE — ED Notes (Signed)
Consulting provider at bedside

## 2017-02-11 NOTE — H&P (Signed)
History and Physical    Tyrone Walker KGY:[id number] DOB: 11-09-1975 DOA: 02/11/2017   PCP: Jolaine Artist, MD   Patient coming from:  Home    Chief Complaint: Shortness of breath  HPI: Tyrone Walker is a 41 y.o. male with medical history significant for Diabetes diet controlled, Paroxysmal Atrial Fibrillation on Xarelto, HTN. NICM with EF 25 %, combined systolic and diastolic CHF, presenting to the ED with persistent, progressively worsening severe shortness of breath waking him up at midnight from his sleep, accompanied by substernal chest discomfort, nausea and mild epigastric pain and distention. The patient has recently moved from [place] about 2 weeks ago,has not been able to continue his medical care here, including following at the CHF clinic. He does admit to food indiscretion with salt rich foods.Denies productive  sputum. Denies rhinorrhea or hemoptysis. Denies fevers, chills, night sweats or mucositis. Reports fevers up to , chills and night sweats. Denies any  palpitations.  Reported mild lower extremity swelling on presentation. He has been compliant with his new meds until 2 days prior to admission, admitting forgetting to take his scheduled meds. Denies any syncope or presyncope but does feel fatigued. No tobacco or ETOH, some use of marijuana. Per chart report, the patient had his last follow-up with heart failure clinic in [place] in 2014  ED Course:  BP (!) 136/95   Pulse (!) 105   Temp 98.8 F (37.1 C) (Oral)   Resp (!) 22   Wt 104.1 kg (229 lb 9.6 oz)   SpO2 97%   BMI 30.29 kg/m    BNP 551 potassium 3.2 creatinine 1.33  glucose 198  hemoglobin 12.2 troponin 0 EKG sinus tachycardia, without significant changes since last tracing in 2012.  last 2D echo performed in [place] in September 2014 showed EF 25%, with grade 1 diastolic, normal systolic   CXR showed pulmonary edema, stable chronic cardiomegaly, and some atelectasis seen  received Lasix 40 mg IV     Review of Systems: As per HPI otherwise 10 point review of systems negative.   Past Medical History:  Diagnosis Date  . Acute on chronic systolic (congestive) heart failure ([facility]) 02/11/2017  . Chronic systolic heart failure ([place])   . Colitis 10/12/10   ER visit  . Elevated LFTs    likely hepatic congestion from CHF  . history Marijuana smoker   . Hypertension 10/18/2011  . Non-ischemic cardiomyopathy ([place]) 10/20/10   a. cath 11/22/10: normal cors; EF 15-20%;  b. echo 11/19/10: EF 20%, mild MR, mild LAE, PASP 48; c. dx 5/12, tx with IV milrinone for low output, Coxsackie B2 titer elevated; o/w HIV, CMV, RMSF, ANA, RF, ESR, ACE all normal    Past Surgical History:  Procedure Laterality Date  . heart catheterization  11/21/10   Dr. Peter Martinique    Social History Social History   Social History  . Marital status: Single    Spouse name: N/A  . Number of children: 2  . Years of education: N/A   Occupational History  . culinary/management    Social History Main Topics  . Smoking status: Never Smoker  . Smokeless tobacco: Never Used  . Alcohol use No  . Drug use: Yes     Comment: occasional marijuana  . Sexual activity: Yes   Other Topics Concern  . Not on file   Social History Narrative   Lives with daughter who is 73 months old and son who is 40 years old     No  Known Allergies  Family History  Problem Relation Age of Onset  . Heart disease Mother   . Heart failure Mother   . Rheumatic fever Mother   . Sleep apnea Father   . Diabetes Father   . Hearing loss Paternal Grandfather 33       CHF  . Colon polyps Unknown        Grandfather  . Sleep apnea Unknown   . Heart disease Maternal Grandmother       Prior to Admission medications   Medication Sig Start Date End Date Taking? Authorizing Provider  cetirizine (ZYRTEC) 10 MG tablet Take 1 tablet (10 mg total) by mouth daily. Patient taking differently: Take 10 mg by mouth daily as needed for allergies.  10/09/11  02/11/17 Yes Deneise Lever, MD  digoxin (LANOXIN) 0.125 MG tablet Take 0.125 mg by mouth daily.   Yes [provider]  furosemide (LASIX) 40 MG tablet Take 1 tablet (40 mg total) by mouth daily. Patient taking differently: Take 20 mg by mouth daily.  01/09/12  Yes Wynetta Emery, PA-C  lisinopril (PRINIVIL,ZESTRIL) 20 MG tablet Take 1 tablet (20 mg total) by mouth 2 (two) times daily. 01/09/12  Yes Wynetta Emery, PA-C  metoprolol succinate (TOPROL-XL) 100 MG 24 hr tablet Take 100 mg by mouth 2 (two) times daily. Take with or immediately following a meal.   Yes [provider]  rivaroxaban (XARELTO) 10 MG TABS tablet Take 10 mg by mouth daily.   Yes [provider]  sacubitril-valsartan (ENTRESTO) 24-26 MG Take 1 tablet by mouth 2 (two) times daily.   Yes [provider]  spironolactone (ALDACTONE) 25 MG tablet Take 1 tablet (25 mg total) by mouth daily. 01/09/12  Yes Wynetta Emery, PA-C    Physical Exam:  Vitals:   02/11/17 0630 02/11/17 0700 02/11/17 0730 02/11/17 0735  BP:      Pulse: (!) 101 (!) 106 (!) 105   Resp: 20 20 (!) 22   Temp:      TempSrc:      SpO2: 99% 98% 98% 97%  Weight:       Constitutional: NAD, calm, comfortable.  Eyes: PERRL, lids and conjunctivae normal ENMT: Mucous membranes are moist, without exudate or lesions  Neck: normal, supple, no masses, no thyromegaly Respiratory:  Essentially clear to auscultation bilaterally, no wheezing, trace crackles, mild atelectatic sounds . Normal respiratory effort  Cardiovascular: tachycardic rate and regular rhythm occasional ectopic sounds, no murmurs, rubs or gallops. Trace bilateral lower extremity edema. 2+ pedal pulses. No carotid bruits.  Abdomen: Soft, non tender, No hepatosplenomegaly. Bowel sounds positive.  Musculoskeletal: no clubbing / cyanosis. Moves all extremities Skin: no jaundice, No lesions.  Neurologic: Sensation intact  Strength equal in all  extremities Psychiatric:   Alert and oriented x 3. Normal mood.     Labs on Admission: I have personally reviewed following labs and imaging studies  CBC:  Recent Labs Lab 02/11/17 0235  WBC 6.5  HGB 12.2*  HCT 37.2*  MCV 86.1  PLT 832    Basic Metabolic Panel:  Recent Labs Lab 02/11/17 0235  NA 138  K 3.2*  CL 103  CO2 27  GLUCOSE 198*  BUN 14  CREATININE 1.33*  CALCIUM 8.5*    GFR: CrCl cannot be calculated (Unknown ideal weight.).  Liver Function Tests: No results for input(s): AST, ALT, ALKPHOS, BILITOT, PROT, ALBUMIN in the last 168 hours. No results for input(s): LIPASE, AMYLASE in the last  168 hours. No results for input(s): AMMONIA in the last 168 hours.  Coagulation Profile: No results for input(s): INR, PROTIME in the last 168 hours.  Cardiac Enzymes: No results for input(s): CKTOTAL, CKMB, CKMBINDEX, TROPONINI in the last 168 hours.  BNP (last 3 results) No results for input(s): PROBNP in the last 8760 hours.  HbA1C: No results for input(s): HGBA1C in the last 72 hours.  CBG: No results for input(s): GLUCAP in the last 168 hours.  Lipid Profile: No results for input(s): CHOL, HDL, LDLCALC, TRIG, CHOLHDL, LDLDIRECT in the last 72 hours.  Thyroid Function Tests: No results for input(s): TSH, T4TOTAL, FREET4, T3FREE, THYROIDAB in the last 72 hours.  Anemia Panel: No results for input(s): VITAMINB12, FOLATE, FERRITIN, TIBC, IRON, RETICCTPCT in the last 72 hours.  Urine analysis:    Component Value Date/Time   COLORURINE YELLOW 11/16/2010 1200   APPEARANCEUR CLEAR 11/16/2010 1200   LABSPEC 1.029 11/16/2010 1200   PHURINE 5.5 11/16/2010 1200   GLUCOSEU NEGATIVE 11/16/2010 1200   HGBUR NEGATIVE 11/16/2010 1200   BILIRUBINUR NEGATIVE 11/16/2010 1200   KETONESUR NEGATIVE 11/16/2010 1200   PROTEINUR NEGATIVE 11/16/2010 1200   UROBILINOGEN 0.2 11/16/2010 1200   NITRITE NEGATIVE 11/16/2010 1200   LEUKOCYTESUR  11/16/2010 1200    NEGATIVE  MICROSCOPIC NOT DONE ON URINES WITH NEGATIVE PROTEIN, BLOOD, LEUKOCYTES, NITRITE, OR GLUCOSE <1000 mg/dL.    Sepsis Labs: @LABRCNTIP (procalcitonin:4,lacticidven:4) )No results found for this or any previous visit (from the past 240 hour(s)).   Radiological Exams on Admission: Dg Chest 2 View  Result Date: 02/11/2017 CLINICAL DATA:  Shortness of breath.  Cough and congestion. EXAM: CHEST  2 VIEW COMPARISON:  Radiographs 11/19/2010 FINDINGS: Chronic cardiomegaly appears similar to prior exam. Unchanged mediastinal contours. Worsening pulmonary edema from prior exam. No definite pleural fluid. Streaky bibasilar opacities are likely atelectasis. No pneumothorax. No acute osseous abnormality. IMPRESSION: Pulmonary edema, increased from most recent comparison exam of 2012. Stable chronic cardiomegaly. Streaky bibasilar opacities are likely atelectasis. Electronically Signed   By: Jeb Levering M.D.   On: 02/11/2017 04:10    EKG: Independently reviewed.  Assessment/Plan Active Problems:   Acute on chronic systolic (congestive) heart failure (HCC)   Non-ischemic cardiomyopathy (HCC)   Chronic systolic heart failure (HCC)   OSA (obstructive sleep apnea)   Hypertension   Type 2 diabetes mellitus (HCC)   AKI (acute kidney injury) ([facility])      Acute on chronic combined systolic diastolic CHF exacerbation, likely due to food indiscretion. History of NICM  BNP 551 troponin 0 EKG sinus tachycardia, without significant changes since 2012.  last 2D echo performed in [place] in September 2014 showed EF 25%, with grade 1 diastolic, normal systolic  CXR showed pulmonary edema, stable chronic cardiomegaly.received Lasix 40 mg IV with significant improvement of symptoms . Patient has recently moved from [place].   Telemetry Obs  CHForder set  TSH    Daily weights and strict I/O   2 D ECHO  Repeat CXR in am  Will inform Cards of patient admission to establish OP follow up    Hypertension BP (!)  136/95   Pulse (!) 105   Temp 98.8 F (37.1 C) (Oral)   Resp (!) 22   Wt 104.1 kg (229 lb 9.6 oz)   SpO2 97%   BMI 30.29 kg/m  Continue home medications,but will hod Lisinopril. Continue Entresto on 8/22 in view of mild increase of Cr.  OP follow up will need to be established  History  of Paroxysmal Atrial Fibrillation  on anticoagulation with Xarelto. EKG [place] neg. Echo pending  Continue meds and continue AC with Xarelto , will defer to OP Cards to d/c or continue it once outpatient         Hypokalemia, may be due to diuretics. Current K 3.2 . EKG without changes from prior Oral replenishment with Kdur 40 meq oral Check Mg Repeat CMET and EKG in am  Acute Kidney Injury likely due to  ACEI, diuresis BL Cr  1-1.12. No documented history of CKD.. Current Cr 1.33. Denies dysuria or retention  Lab Results  Component Value Date   CREATININE 1.33 (H) 02/11/2017   CREATININE 1.01 03/06/2012   CREATININE 1.12 10/18/2011  CMET in am  Lisinopril d/c'd after discussion with Pharmacy. Entresto to be held till 8/22      Type II Diabetes, diet controlled Current blood sugar level is 198 Lab Results  Component Value Date   HGBA1C 6.6 10/18/2011  Hgb A1C  SSI  OSA Continue CPAP nightly    DVT prophylaxis: Xarelto Code Status:   Full    Family Communication:  Discussed with patient Disposition Plan: Expect patient to be discharged to home after condition improves Consults called:   None Admission status:Tele  Obs    Nasier Thumm E, PA-C [facility]   02/11/2017, 7:48 AM

## 2017-02-11 NOTE — Progress Notes (Signed)
Pt refuses CPAP. Advised to call if needed anything 

## 2017-02-11 NOTE — ED Notes (Signed)
Attempted report 

## 2017-02-11 NOTE — ED Triage Notes (Addendum)
Pt presents with shob, emesis, diaphoresis from sleep. Pt states a feeling of drowning woke him from sleep. Pt states he is unable to recline at all.  Last echo EF 20%

## 2017-02-11 NOTE — ED Notes (Signed)
Patient transported to X-ray 

## 2017-02-11 NOTE — ED Notes (Signed)
Called lab to add on BNP. ?

## 2017-02-11 NOTE — ED Provider Notes (Signed)
[facility] DEPT Provider Note   CSN: [id number] Arrival date & time: 02/11/17  0218     History   Chief Complaint Chief Complaint  Patient presents with  . Shortness of Breath    HPI Tyrone Walker is a 41 y.o. male with a hx of chronic systolic heart failure, nonischemic cardiomyopathy with an EF of 23%, hypertension, diabetes presents to the Emergency Department complaining of acute, persistent, progressively worsening severe SOB waking him from sleep around midnight.  Pt reports he additionally has epigastric abd pain and 2 episodes of NBNB emesis.  Pt reports the abd pain is not usual, but his episodes of SOB have happened before.  Pt reports he does not wear oxygen at home.  Pt reports he recently moved back from [place] and has not yet reestablished care with a PCP or the HF clinic.  Pt reports compliance with medications without missed doses. He reports subjective fevers and cough x 2 days with concern for PNA, but no known sick contacts.  Pt reports is unable to lay flat for any reason at this time. Pt denies syncope, chest pain, diarrhea, weakness, dizziness, syncope, palpitations, leg swelling,   Record review shows patient's last follow-up with heart failure clinic was 2014. At that time patient had a history of systolic heart failure due to nonischemic cardiomyopathy. EF was initially 20% with diffuse hypokinesis and grade 1 diastolic dysfunction that had improved to 50-55 percent by echo in November 2013. Cardiac cath in June 2012 showed normal coronaries. He also has a history of proximal A. Fib and type 2 diabetes.  The history is provided by the patient and medical records. No language interpreter was used.    Past Medical History:  Diagnosis Date  . Acute on chronic systolic (congestive) heart failure ([place]) 02/11/2017  . Chronic systolic heart failure ([place])   . Colitis 10/12/10   ER visit  . Elevated LFTs    likely hepatic congestion from CHF  . history Marijuana  smoker   . Hypertension 10/18/2011  . Non-ischemic cardiomyopathy ([place]) 10/20/10   a. cath 11/22/10: normal cors; EF 15-20%;  b. echo 11/19/10: EF 20%, mild MR, mild LAE, PASP 48; c. dx 5/12, tx with IV milrinone for low output, Coxsackie B2 titer elevated; o/w HIV, CMV, RMSF, ANA, RF, ESR, ACE all normal    Patient Active Problem List   Diagnosis Date Noted  . Acute on chronic systolic (congestive) heart failure ([place]) 02/11/2017  . Acute bacterial sinusitis 03/06/2012  . Type 2 diabetes mellitus ([place]) 10/23/2011  . Molluscum contagiosum 10/18/2011  . Dyshidrotic eczema 10/18/2011  . Hypertension 10/18/2011  . Allergic rhinitis 10/09/2011  . OSA (obstructive sleep apnea) 02/16/2011  . Blurred vision, bilateral 12/05/2010  . Chronic systolic heart failure ([place])   . Nonspecific (abnormal) findings on radiological and other examination of abdominal area, including retroperitoneum 11/17/2010  . Non-ischemic cardiomyopathy ([place]) 10/20/2010    Past Surgical History:  Procedure Laterality Date  . heart catheterization  11/21/10   Dr. Peter Martinique       Home Medications    Prior to Admission medications   Medication Sig Start Date End Date Taking? Authorizing Provider  cetirizine (ZYRTEC) 10 MG tablet Take 1 tablet (10 mg total) by mouth daily. Patient taking differently: Take 10 mg by mouth daily as needed for allergies.  10/09/11 02/11/17 Yes Deneise Lever, MD  digoxin (LANOXIN) 0.125 MG tablet Take 0.125 mg by mouth daily.   Yes [provider]  furosemide (  LASIX) 40 MG tablet Take 1 tablet (40 mg total) by mouth daily. Patient taking differently: Take 20 mg by mouth daily.  01/09/12  Yes Wynetta Emery, PA-C  lisinopril (PRINIVIL,ZESTRIL) 20 MG tablet Take 1 tablet (20 mg total) by mouth 2 (two) times daily. 01/09/12  Yes Wynetta Emery, PA-C  metoprolol succinate (TOPROL-XL) 100 MG 24 hr tablet Take 100 mg by mouth 2 (two) times daily. Take with or immediately following a  meal.   Yes [provider]  rivaroxaban (XARELTO) 10 MG TABS tablet Take 10 mg by mouth daily.   Yes [provider]  sacubitril-valsartan (ENTRESTO) 24-26 MG Take 1 tablet by mouth 2 (two) times daily.   Yes [provider]  spironolactone (ALDACTONE) 25 MG tablet Take 1 tablet (25 mg total) by mouth daily. 01/09/12  Yes Wynetta Emery, PA-C    Family History Family History  Problem Relation Age of Onset  . Heart disease Mother   . Heart failure Mother   . Rheumatic fever Mother   . Sleep apnea Father   . Diabetes Father   . Hearing loss Paternal Grandfather 55       CHF  . Colon polyps Unknown        Grandfather  . Sleep apnea Unknown   . Heart disease Maternal Grandmother     Social History Social History  Substance Use Topics  . Smoking status: Never Smoker  . Smokeless tobacco: Never Used  . Alcohol use No     Allergies   Patient has no known allergies.   Review of Systems Review of Systems  Constitutional: Positive for fatigue and fever ( subjective). Negative for appetite change, diaphoresis and unexpected weight change.  HENT: Negative for mouth sores.   Eyes: Negative for visual disturbance.  Respiratory: Positive for cough, chest tightness and shortness of breath. Negative for wheezing.   Cardiovascular: Negative for chest pain.  Gastrointestinal: Positive for abdominal pain. Negative for constipation, diarrhea, nausea and vomiting.  Endocrine: Negative for polydipsia, polyphagia and polyuria.  Genitourinary: Negative for dysuria, frequency, hematuria and urgency.  Musculoskeletal: Negative for back pain and neck stiffness.  Skin: Negative for rash.  Allergic/Immunologic: Negative for immunocompromised state.  Neurological: Negative for syncope, light-headedness and headaches.  Hematological: Does not bruise/bleed easily.  Psychiatric/Behavioral: Negative for sleep disturbance. The patient is not nervous/anxious.   All other  systems reviewed and are negative.    Physical Exam Updated Vital Signs BP (!) 141/107 (BP Location: Right Arm)   Pulse (!) 106   Temp 98.8 F (37.1 C) (Oral)   Resp (!) 28   Wt 104.1 kg (229 lb 9.6 oz)   SpO2 93%   BMI 30.29 kg/m   Physical Exam  Constitutional: He appears well-developed and well-nourished. He appears distressed.  Awake, alert, nontoxic appearance  HENT:  Head: Normocephalic and atraumatic.  Mouth/Throat: Oropharynx is clear and moist. No oropharyngeal exudate.  Eyes: Conjunctivae are normal. No scleral icterus.  Neck: Normal range of motion. Neck supple.  Cardiovascular: Regular rhythm and intact distal pulses.  Tachycardia present.   Pulses:      Radial pulses are 2+ on the right side, and 2+ on the left side.       Dorsalis pedis pulses are 2+ on the right side, and 2+ on the left side.  Pulmonary/Chest: Effort normal. No respiratory distress. He has no wheezes.  Equal chest expansion Diminished but clear breath sounds Pt with [place] in place  Abdominal: Soft.  Bowel sounds are normal. He exhibits no mass. There is tenderness in the epigastric area. There is guarding. There is no rigidity, no rebound, no CVA tenderness, no tenderness at McBurney's point and negative Murphy's sign.  Exam limited as pt reports he is unable to lay flat for abd exam  Musculoskeletal: Normal range of motion. He exhibits no edema.  Neurological: He is alert.  Speech is clear and goal oriented Moves extremities without ataxia  Skin: Skin is warm and dry. He is not diaphoretic.  Psychiatric: He has a normal mood and affect.  Nursing note and vitals reviewed.    ED Treatments / Results  Labs (all labs ordered are listed, but only abnormal results are displayed) Labs Reviewed  BASIC METABOLIC PANEL - Abnormal; Notable for the following:       Result Value   Potassium 3.2 (*)    Glucose, Bld 198 (*)    Creatinine, Ser 1.33 (*)    Calcium 8.5 (*)    All other components  within normal limits  CBC - Abnormal; Notable for the following:    Hemoglobin 12.2 (*)    HCT 37.2 (*)    All other components within normal limits  BRAIN NATRIURETIC PEPTIDE - Abnormal; Notable for the following:    B Natriuretic Peptide 551.1 (*)    All other components within normal limits  HEPATIC FUNCTION PANEL  LIPASE, BLOOD  I-STAT TROPONIN, ED    EKG  EKG Interpretation  Date/Time:  Monday February 11 2017 02:37:35 EDT Ventricular Rate:  105 PR Interval:  192 QRS Duration: 94 QT Interval:  362 QTC Calculation: 478 R Axis:   62 Text Interpretation:  Sinus tachycardia Biatrial enlargement Nonspecific T wave abnormality Abnormal ECG No significant change since last tracing since 2012 Confirmed by Pryor Curia [phone]) on 02/11/2017 2:52:24 AM       Radiology Dg Chest 2 View  Result Date: 02/11/2017 CLINICAL DATA:  Shortness of breath.  Cough and congestion. EXAM: CHEST  2 VIEW COMPARISON:  Radiographs 11/19/2010 FINDINGS: Chronic cardiomegaly appears similar to prior exam. Unchanged mediastinal contours. Worsening pulmonary edema from prior exam. No definite pleural fluid. Streaky bibasilar opacities are likely atelectasis. No pneumothorax. No acute osseous abnormality. IMPRESSION: Pulmonary edema, increased from most recent comparison exam of 2012. Stable chronic cardiomegaly. Streaky bibasilar opacities are likely atelectasis. Electronically Signed   By: Jeb Levering M.D.   On: 02/11/2017 04:10    Procedures Procedures (including critical care time)  Medications Ordered in ED Medications  morphine 4 MG/ML injection 4 mg (4 mg Intravenous Given 02/11/17 0439)  furosemide (LASIX) injection 40 mg (40 mg Intravenous Given 02/11/17 0439)     Initial Impression / Assessment and Plan / ED Course  I have reviewed the triage vital signs and the nursing notes.  Pertinent labs & imaging results that were available during my care of the patient were reviewed by me and  considered in my medical decision making (see chart for details).  Clinical Course as of Feb 12 627  Mon Feb 11, 2017  0500 Initial tachypnea and lower oxygen saturations therefore the pt was placed on oxygen with improvement in comfort.   SpO2: 93 % [HM]  0600 Elevated B Natriuretic Peptide: (!) 551.1 [HM]  [id number] Discussed with Dr. Loleta Books who will assess for admission.  Pt remains tachycardic, but has had large volume urine output after lasix  [HM]    Clinical Course User Index [HM] Divonte Senger, Jarrett Soho, PA-C    Pt with  CHF exacerbation with elevated BNP and fluid overload on CXR.  Pt with neg trop and nonischemic ECG.  Pt just moved back to [place] and has not yet established care with the HF clinic or a PCP.  He has been given lasix with large volume urine output.  Pt reports he is feeling some better.  Abd pain has resolved and he is now soft and nontender on exam.  Pt discussed with Dr. Loleta Books for admission.  Pt and wife agree to admission.     Final Clinical Impressions(s) / ED Diagnoses   Final diagnoses:  SOB (shortness of breath)  Acute on chronic congestive heart failure, unspecified heart failure type [facility])    New Prescriptions New Prescriptions   No medications on file     Agapito Games 02/11/17 [place], Delice Bison, DO 02/11/17 [phone]

## 2017-02-12 ENCOUNTER — Observation Stay (HOSPITAL_COMMUNITY): Payer: Medicaid - Out of State

## 2017-02-12 ENCOUNTER — Other Ambulatory Visit (HOSPITAL_COMMUNITY): Payer: Medicaid - Out of State

## 2017-02-12 DIAGNOSIS — E118 Type 2 diabetes mellitus with unspecified complications: Secondary | ICD-10-CM | POA: Diagnosis not present

## 2017-02-12 DIAGNOSIS — I10 Essential (primary) hypertension: Secondary | ICD-10-CM | POA: Diagnosis not present

## 2017-02-12 DIAGNOSIS — N179 Acute kidney failure, unspecified: Secondary | ICD-10-CM | POA: Diagnosis not present

## 2017-02-12 DIAGNOSIS — I5043 Acute on chronic combined systolic (congestive) and diastolic (congestive) heart failure: Secondary | ICD-10-CM | POA: Diagnosis not present

## 2017-02-12 DIAGNOSIS — I428 Other cardiomyopathies: Secondary | ICD-10-CM

## 2017-02-12 DIAGNOSIS — G4733 Obstructive sleep apnea (adult) (pediatric): Secondary | ICD-10-CM | POA: Diagnosis not present

## 2017-02-12 LAB — GLUCOSE, CAPILLARY
GLUCOSE-CAPILLARY: 171 mg/dL — AB (ref 65–99)
Glucose-Capillary: 207 mg/dL — ABNORMAL HIGH (ref 65–99)
Glucose-Capillary: 244 mg/dL — ABNORMAL HIGH (ref 65–99)
Glucose-Capillary: 166 mg/dL — ABNORMAL HIGH (ref 65–99)

## 2017-02-12 LAB — BASIC METABOLIC PANEL
Anion gap: 8 (ref 5–15)
BUN: 16 mg/dL (ref 6–20)
CHLORIDE: 103 mmol/L (ref 101–111)
CO2: 26 mmol/L (ref 22–32)
Calcium: 9.4 mg/dL (ref 8.9–10.3)
Creatinine, Ser: 1.29 mg/dL — ABNORMAL HIGH (ref 0.61–1.24)
GFR calc Af Amer: >60 mL/min (ref >60)
GFR calc non Af Amer: >60 mL/min (ref >60)
GLUCOSE: 213 mg/dL — AB (ref 65–99)
POTASSIUM: 4.6 mmol/L (ref 3.5–5.1)
Sodium: 137 mmol/L (ref 135–145)

## 2017-02-12 MED ORDER — FUROSEMIDE 10 MG/ML IJ SOLN
40.0000 mg | Freq: Every day | INTRAMUSCULAR | Status: DC
  Administered 2017-02-13: 40 mg via INTRAVENOUS
  Filled 2017-02-12: qty 4

## 2017-02-12 MED ORDER — LIVING WELL WITH DIABETES BOOK
Freq: Once | Status: DC
  Filled 2017-02-12 (×2): qty 1

## 2017-02-12 MED ORDER — FUROSEMIDE 40 MG PO TABS: 40.0000 mg | ORAL_TABLET | Freq: Every day | ORAL | 6 refills | Status: DC

## 2017-02-12 NOTE — Progress Notes (Signed)
PROGRESS NOTE    Tyrone Walker  TLX:[id number] DOB: [birth year] DOA: 02/11/2017 PCP: Dolores Patty, MD    Brief Narrative:  Tyrone Walker is a 41 y.o. male who presents with acute on chronic combined systolic and diastolic congestive heart failure. Last Echo showing EF of 20-25% and grade 2 diastolic dysfunction. Questionable medical compliance and dietary indiscretion likely lead to current exacerbation.  Assessment & Plan:   Active Problems:   Non-ischemic cardiomyopathy (HCC)   Chronic systolic heart failure (HCC)   OSA (obstructive sleep apnea)   Hypertension   Diabetes mellitus with complication (HCC)   Acute on chronic combined systolic and diastolic CHF (congestive heart failure) (HCC)   AKI (acute kidney injury) (HCC)   Acute on chronic systolic and diastolic heart failure secondary to non compliance to fluid and diet: Admitted to telemetry and started on IV lasix, diuresed about 5.3 liters so far.  Last echo from 2014, repeat echocardiogram this admission.  Strict intake and output, daily weights. Pt will need a referral to heart failure clinic on discharge.  Repeat CXR shows resolution of CHF.    H/o PAF:  Resume xarelto.  Will need outpatient follow up with cardiology on discharge.    Hypertension:  Well controlled.    AKI:  Suspect this creatinine is his new baseline.  Repeat bmp in am.  Ace inhibitor discontinued on admission.  Can be resumed on discharge.    Type 2 DM:  Non compliant to meds and diet.  A1c is 11.  Will need long acting insulin , but pt is refusing to be placed on it.  Wants to go back on his metformin , but reports he hasn't used metformin in 3 months.     Hypokalemia: replaced.    Osa:  CPAP at night.      DVT prophylaxis: xarelto Code Status: full code.  Family Communication: none at bedside.  Disposition Plan:pending echocardiogram.    Consultants:   NONE.   Procedures: Echo  Antimicrobials:None.    Subjective: Breathing better, wants to go home.   Objective: Vitals:   02/12/17 0936 02/12/17 1000 02/12/17 1157 02/12/17 1541  BP: (!) 128/100 (!) 148/93  (!) 130/95  Pulse: 100 97 95 88  Resp: 18  18 18   Temp: 98.5 F (36.9 C)  98.2 F (36.8 C) 98.4 F (36.9 C)  TempSrc: Oral  Oral Oral  SpO2: 95%  97% 100%  Weight:      Height:        Intake/Output Summary (Last 24 hours) at 02/12/17 1727 Last data filed at 02/12/17 1106  Gross per 24 hour  Intake              243 ml  Output             2840 ml  Net            -2597 ml   Filed Weights   02/11/17 0233 02/11/17 1639 02/12/17 0600  Weight: 104.1 kg (229 lb 9.6 oz) 101 kg (222 lb 9.6 oz) 99 kg (218 lb 3.2 oz)    Examination:  General exam: Appears calm and comfortable  Respiratory system: Clear to auscultation. Respiratory effort normal. Cardiovascular system: S1 & S2 heard, RRR. No JVD, murmurs, rubs, gallops or clicks. No pedal edema. Gastrointestinal system: Abdomen is nondistended, soft and nontender. No organomegaly or masses felt. Normal bowel sounds heard. Central nervous system: Alert and oriented. No focal neurological deficits. Extremities: Symmetric 5 x 5 power. Skin:  No rashes, lesions or ulcers Psychiatry: Judgement and insight appear normal. Mood & affect appropriate.     Data Reviewed: I have personally reviewed following labs and imaging studies  CBC:  Recent Labs Lab 02/11/17 0235  WBC 6.5  HGB 12.2*  HCT 37.2*  MCV 86.1  PLT 170   Basic Metabolic Panel:  Recent Labs Lab 02/11/17 0235 02/11/17 0956 02/12/17 1207  NA 138  --  137  K 3.2*  --  4.6  CL 103  --  103  CO2 27  --  26  GLUCOSE 198*  --  213*  BUN 14  --  16  CREATININE 1.33*  --  1.29*  CALCIUM 8.5*  --  9.4  MG  --  1.9  --    GFR: Estimated Creatinine Clearance: 90 mL/min (A) (by C-G formula based on SCr of 1.29 mg/dL (H)). Liver Function Tests:  Recent Labs Lab 02/11/17 0956  AST 31  ALT 28  ALKPHOS 39   BILITOT 1.1  PROT 6.7  ALBUMIN 3.7    Recent Labs Lab 02/11/17 0956  LIPASE 19   No results for input(s): AMMONIA in the last 168 hours. Coagulation Profile: No results for input(s): INR, PROTIME in the last 168 hours. Cardiac Enzymes: No results for input(s): CKTOTAL, CKMB, CKMBINDEX, TROPONINI in the last 168 hours. BNP (last 3 results) No results for input(s): PROBNP in the last 8760 hours. HbA1C:  Recent Labs  02/11/17 0955  HGBA1C 11.5*   CBG:  Recent Labs Lab 02/11/17 1659 02/11/17 2102 02/12/17 1154 02/12/17 1426 02/12/17 1539  GLUCAP 140* 188* 207* 244* 166*   Lipid Profile: No results for input(s): CHOL, HDL, LDLCALC, TRIG, CHOLHDL, LDLDIRECT in the last 72 hours. Thyroid Function Tests:  Recent Labs  02/11/17 0956  TSH 1.049   Anemia Panel: No results for input(s): VITAMINB12, FOLATE, FERRITIN, TIBC, IRON, RETICCTPCT in the last 72 hours. Sepsis Labs: No results for input(s): PROCALCITON, LATICACIDVEN in the last 168 hours.  No results found for this or any previous visit (from the past 240 hour(s)).       Radiology Studies: Dg Chest 2 View  Result Date: 02/12/2017 CLINICAL DATA:  CHF. EXAM: CHEST  2 VIEW COMPARISON:  02/11/2017 FINDINGS: Midline trachea. Moderate cardiomegaly. No pleural effusion or pneumothorax. Resolved interstitial edema. No lobar consolidation. IMPRESSION: Cardiomegaly with resolution of congestive heart failure. Electronically Signed   By: Jeronimo Greaves M.D.   On: 02/12/2017 08:53   Dg Chest 2 View  Result Date: 02/11/2017 CLINICAL DATA:  Shortness of breath.  Cough and congestion. EXAM: CHEST  2 VIEW COMPARISON:  Radiographs 11/19/2010 FINDINGS: Chronic cardiomegaly appears similar to prior exam. Unchanged mediastinal contours. Worsening pulmonary edema from prior exam. No definite pleural fluid. Streaky bibasilar opacities are likely atelectasis. No pneumothorax. No acute osseous abnormality. IMPRESSION: Pulmonary  edema, increased from most recent comparison exam of 2012. Stable chronic cardiomegaly. Streaky bibasilar opacities are likely atelectasis. Electronically Signed   By: Rubye Oaks M.D.   On: 02/11/2017 04:10        Scheduled Meds: . digoxin  0.125 mg Oral Daily  . furosemide  40 mg Oral Daily  . insulin aspart  0-9 Units Subcutaneous TID WC  . living well with diabetes book   Does not apply Once  . metoprolol succinate  100 mg Oral BID  . rivaroxaban  20 mg Oral Q supper  . [START ON 02/13/2017] sacubitril-valsartan  1 tablet Oral BID  . sodium chloride  flush  3 mL Intravenous Q12H  . spironolactone  25 mg Oral Daily   Continuous Infusions: . sodium chloride       LOS: 0 days    Time spent: 35 minutes.     Kathlen Mody, MD [facility] Pager [phone]  If 7PM-7AM, please contact night-coverage [email or web address] Password [facility] 02/12/2017, 5:27 PM

## 2017-02-12 NOTE — Discharge Instructions (Signed)

## 2017-02-12 NOTE — Progress Notes (Signed)
  RD consulted for nutrition education regarding diabetes.   Lab Results  Component Value Date   HGBA1C 11.5 (H) 02/11/2017    RD provided "Carbohydrate Counting for People with Diabetes" handout from the Academy of Nutrition and Dietetics. Discussed different food groups and their effects on blood sugar, emphasizing carbohydrate-containing foods. Provided list of carbohydrates and recommended serving sizes of common foods.Discussed importance of controlled and consistent carbohydrate intake throughout the day. Provided examples of ways to balance meals/snacks and encouraged intake of high-fiber, whole grain complex carbohydrates. Teach back method used.  Provided examples on ways to decrease sodium intake in diet. Discouraged intake of processed foods and use of salt shaker. Encouraged fresh fruits and vegetables as well as whole grain sources of carbohydrates to maximize fiber intake. RD discussed why it is important for patient to adhere to diet recommendations, and emphasized the role of fluids, foods to avoid, and importance of weighing self daily. Teach back method used.  Pt reports eating excessive amount of sweets, "Oreos" in particular. Consumes high sodium options like pizza, lasagna, and soups. Discussed different options that follow a consistent carbohydrate and low sodium diet.   Expect fair compliance.  Body mass index is 29.59 kg/m. Pt meets criteria for overweight based on current BMI.  Current diet order is renal/carb modified, patient is consuming approximately 100% of meals at this time. Labs and medications reviewed. No further nutrition interventions warranted at this time. RD contact information provided. If additional nutrition issues arise, please re-consult RD.  Vanessa Kick RD, LDN Clinical Nutrition Pager # [phone]

## 2017-02-12 NOTE — Progress Notes (Signed)
Pt slept well during the night, Vitals stable, no any sign of SOB and distress noted, xanax provided for a complain of anxiety, no any complain of pain, will continue to monitor the patient

## 2017-02-12 NOTE — Progress Notes (Signed)
Inpatient Diabetes Program Recommendations  AACE/ADA: New Consensus Statement on Inpatient Glycemic Control (2015)  Target Ranges:  Prepandial:   less than 140 mg/dL      Peak postprandial:   less than 180 mg/dL (1-2 hours)      Critically ill patients:  140 - 180 mg/dL   Lab Results  Component Value Date   GLUCAP 244 (H) 02/12/2017   HGBA1C 11.5 (H) 02/11/2017    Review of Glycemic Control  Spoke with pt about  A1C results 11.5 (average blood glucose 283 over the past 2-3 months) and explained what an A1C is, basic pathophysiology of DM Type 2, basic home care, basic diabetes diet nutrition principles, importance of checking CBGs and maintaining good CBG control to prevent long-term and short-term complications. Reviewed signs and symptoms of hyperglycemia and hypoglycemia and how to treat hypoglycemia at home. Also reviewed blood sugar goals at home.  RNs to provide ongoing basic DM education at bedside with this patient. Have ordered educational booklet and DM videos. Have also placed RD consult for DM diet education for this patient. Patient interested in attending outpatient DM education classes and order placed. Patient shared that he has been eating lots of cakes and oreo cookies along with sweetened drinks while off the summer with his children.   Thank you, Tyrone Walker. Tyrone Rena, RN, MSN, CDE  Diabetes Coordinator Inpatient Glycemic Control Team Team Pager [phone] (8am-5pm) 02/12/2017 2:36 PM

## 2017-02-12 NOTE — Progress Notes (Signed)
PT refused. Will continue to monitor PT throughout the night. 

## 2017-02-13 ENCOUNTER — Observation Stay (HOSPITAL_COMMUNITY): Payer: Medicaid - Out of State

## 2017-02-13 ENCOUNTER — Encounter (HOSPITAL_COMMUNITY): Payer: Self-pay | Admitting: Internal Medicine

## 2017-02-13 ENCOUNTER — Other Ambulatory Visit (HOSPITAL_COMMUNITY): Payer: Medicaid - Out of State

## 2017-02-13 DIAGNOSIS — Z9111 Patient's noncompliance with dietary regimen: Secondary | ICD-10-CM | POA: Diagnosis not present

## 2017-02-13 DIAGNOSIS — E118 Type 2 diabetes mellitus with unspecified complications: Secondary | ICD-10-CM | POA: Diagnosis not present

## 2017-02-13 DIAGNOSIS — Z9114 Patient's other noncompliance with medication regimen: Secondary | ICD-10-CM | POA: Diagnosis not present

## 2017-02-13 DIAGNOSIS — E119 Type 2 diabetes mellitus without complications: Secondary | ICD-10-CM | POA: Diagnosis present

## 2017-02-13 DIAGNOSIS — T464X5A Adverse effect of angiotensin-converting-enzyme inhibitors, initial encounter: Secondary | ICD-10-CM | POA: Diagnosis present

## 2017-02-13 DIAGNOSIS — J9811 Atelectasis: Secondary | ICD-10-CM | POA: Diagnosis present

## 2017-02-13 DIAGNOSIS — I361 Nonrheumatic tricuspid (valve) insufficiency: Secondary | ICD-10-CM | POA: Diagnosis not present

## 2017-02-13 DIAGNOSIS — I5043 Acute on chronic combined systolic (congestive) and diastolic (congestive) heart failure: Secondary | ICD-10-CM | POA: Diagnosis present

## 2017-02-13 DIAGNOSIS — I340 Nonrheumatic mitral (valve) insufficiency: Secondary | ICD-10-CM

## 2017-02-13 DIAGNOSIS — Z79899 Other long term (current) drug therapy: Secondary | ICD-10-CM | POA: Diagnosis not present

## 2017-02-13 DIAGNOSIS — Z9119 Patient's noncompliance with other medical treatment and regimen: Secondary | ICD-10-CM | POA: Diagnosis not present

## 2017-02-13 DIAGNOSIS — I480 Paroxysmal atrial fibrillation: Secondary | ICD-10-CM | POA: Diagnosis present

## 2017-02-13 DIAGNOSIS — R000 Tachycardia, unspecified: Secondary | ICD-10-CM | POA: Diagnosis present

## 2017-02-13 DIAGNOSIS — Z9989 Dependence on other enabling machines and devices: Secondary | ICD-10-CM | POA: Diagnosis not present

## 2017-02-13 DIAGNOSIS — N179 Acute kidney failure, unspecified: Secondary | ICD-10-CM | POA: Diagnosis present

## 2017-02-13 DIAGNOSIS — I5033 Acute on chronic diastolic (congestive) heart failure: Secondary | ICD-10-CM | POA: Diagnosis present

## 2017-02-13 DIAGNOSIS — E876 Hypokalemia: Secondary | ICD-10-CM | POA: Diagnosis present

## 2017-02-13 DIAGNOSIS — I428 Other cardiomyopathies: Secondary | ICD-10-CM | POA: Diagnosis present

## 2017-02-13 DIAGNOSIS — I110 Hypertensive heart disease with heart failure: Secondary | ICD-10-CM | POA: Diagnosis present

## 2017-02-13 DIAGNOSIS — Z7901 Long term (current) use of anticoagulants: Secondary | ICD-10-CM | POA: Diagnosis not present

## 2017-02-13 DIAGNOSIS — T502X5A Adverse effect of carbonic-anhydrase inhibitors, benzothiadiazides and other diuretics, initial encounter: Secondary | ICD-10-CM | POA: Diagnosis present

## 2017-02-13 DIAGNOSIS — G4733 Obstructive sleep apnea (adult) (pediatric): Secondary | ICD-10-CM | POA: Diagnosis present

## 2017-02-13 DIAGNOSIS — R0789 Other chest pain: Secondary | ICD-10-CM | POA: Diagnosis present

## 2017-02-13 DIAGNOSIS — I10 Essential (primary) hypertension: Secondary | ICD-10-CM | POA: Diagnosis not present

## 2017-02-13 HISTORY — DX: Nonrheumatic mitral (valve) insufficiency: I34.0

## 2017-02-13 LAB — ECHOCARDIOGRAM COMPLETE
Area-P 1/2: 2.34 cm2
CHL CUP MV DEC (S): 100
CHL CUP TV REG PEAK VELOCITY: 306 cm/s
E decel time: 100 msec
E/e' ratio: 13.33
FS: 17 % — AB (ref 28–44)
HEIGHTINCHES: 72 in
IVS/LV PW RATIO, ED: 0.75
LA diam end sys: 52 mm
LA vol A4C: 140 ml
LADIAMINDEX: 2.31 cm/m2
LASIZE: 52 mm
LAVOL: 155 mL
LAVOLIN: 68.8 mL/m2
LV PW d: 11.9 mm — AB (ref 0.6–1.1)
LV TDI E'LATERAL: 8.85
LV TDI E'MEDIAL: 9.6
LV e' LATERAL: 8.85 cm/s
LVEEAVG: 13.33
LVEEMED: 13.33
LVOT area: 3.46 cm2
LVOT diameter: 21 mm
Lateral S' vel: 11.3 cm/s
MRPISAEROA: 0.11 cm2
MV VTI: 170 cm
MV pk A vel: 55.8 m/s
MV pk E vel: 118 m/s
MVPG: 6 mmHg
P 1/2 time: 88 ms
RV TAPSE: 23.8 mm
TRMAXVEL: 306 cm/s
WEIGHTICAEL: 3467.2 [oz_av]

## 2017-02-13 LAB — CBC WITH DIFFERENTIAL/PLATELET
Basophils Absolute: 0 10*3/uL (ref 0.0–0.1)
Basophils Relative: 0 %
EOS PCT: 2 %
Eosinophils Absolute: 0.1 10*3/uL (ref 0.0–0.7)
HEMATOCRIT: 43 % (ref 39.0–52.0)
Hemoglobin: 14 g/dL (ref 13.0–17.0)
Lymphocytes Relative: 33 %
Lymphs Abs: 2.1 10*3/uL (ref 0.7–4.0)
MCH: 28.1 pg (ref 26.0–34.0)
MCHC: 32.6 g/dL (ref 30.0–36.0)
MCV: 86.3 fL (ref 78.0–100.0)
MONO ABS: 0.5 10*3/uL (ref 0.1–1.0)
Monocytes Relative: 7 %
Neutro Abs: 3.6 10*3/uL (ref 1.7–7.7)
Neutrophils Relative %: 58 %
Platelets: 201 10*3/uL (ref 150–400)
RBC: 4.98 MIL/uL (ref 4.22–5.81)
RDW: 13.3 % (ref 11.5–15.5)
WBC: 6.2 10*3/uL (ref 4.0–10.5)

## 2017-02-13 LAB — GLUCOSE, CAPILLARY
GLUCOSE-CAPILLARY: 188 mg/dL — AB (ref 65–99)
Glucose-Capillary: 149 mg/dL — ABNORMAL HIGH (ref 65–99)
Glucose-Capillary: 221 mg/dL — ABNORMAL HIGH (ref 65–99)
Glucose-Capillary: 211 mg/dL — ABNORMAL HIGH (ref 65–99)

## 2017-02-13 LAB — BASIC METABOLIC PANEL
Anion gap: 9 (ref 5–15)
BUN: 15 mg/dL (ref 6–20)
CO2: 26 mmol/L (ref 22–32)
Calcium: 9.3 mg/dL (ref 8.9–10.3)
Chloride: 101 mmol/L (ref 101–111)
Creatinine, Ser: 1.16 mg/dL (ref 0.61–1.24)
GFR calc Af Amer: >60 mL/min (ref >60)
GLUCOSE: 287 mg/dL — AB (ref 65–99)
POTASSIUM: 3.9 mmol/L (ref 3.5–5.1)
SODIUM: 136 mmol/L (ref 135–145)

## 2017-02-13 LAB — MAGNESIUM: Magnesium: 1.8 mg/dL (ref 1.7–2.4)

## 2017-02-13 MED ORDER — PERFLUTREN LIPID MICROSPHERE
1.0000 mL | INTRAVENOUS | Status: AC | PRN
  Administered 2017-02-13: 3 mL via INTRAVENOUS
  Filled 2017-02-13: qty 10

## 2017-02-13 MED ORDER — FUROSEMIDE 40 MG PO TABS
40.0000 mg | ORAL_TABLET | Freq: Every day | ORAL | Status: DC
  Administered 2017-02-14: 40 mg via ORAL
  Filled 2017-02-13: qty 1

## 2017-02-13 NOTE — Progress Notes (Signed)
Heart Failure Navigator Consult Note  Presentation: Tyrone Walker is a 41 y.o. male with medical history significant for Diabetes diet controlled, Paroxysmal Atrial Fibrillation on Xarelto, HTN. NICM with EF 25 %, combined systolic and diastolic CHF, presenting to the ED with persistent, progressively worsening severe shortness of breath waking him up at midnight from his sleep, accompanied by substernal chest discomfort, nausea and mild epigastric pain and distention. The patient has recently moved from [place] about 2 weeks ago,has not been able to continue his medical care here, including following at the CHF clinic. He does admit to food indiscretion with salt rich foods.Denies productive  sputum. Denies rhinorrhea or hemoptysis. Denies fevers, chills, night sweats or mucositis. Reports fevers up to , chills and night sweats. Denies any  palpitations.  Reported mild lower extremity swelling on presentation. He has been compliant with his new meds until 2 days prior to admission, admitting forgetting to take his scheduled meds. Denies any syncope or presyncope but does feel fatigued. No tobacco or ETOH, some use of marijuana. Per chart report, the patient had his last follow-up with heart failure clinic in [place] in 2014.  Past Medical History:  Diagnosis Date  . Acute on chronic systolic (congestive) heart failure ([place]) 02/11/2017  . Chronic systolic heart failure ([place])   . Colitis 10/12/10   ER visit  . Elevated LFTs    likely hepatic congestion from CHF  . history Marijuana smoker   . Hypertension 10/18/2011  . Mitral valve regurgitation: Moderate to severe per 2 d echo 02/13/2017 02/13/2017  . Non-ischemic cardiomyopathy ([place]) 10/20/10   a. cath 11/22/10: normal cors; EF 15-20%;  b. echo 11/19/10: EF 20%, mild MR, mild LAE, PASP 48; c. dx 5/12, tx with IV milrinone for low output, Coxsackie B2 titer elevated; o/w HIV, CMV, RMSF, ANA, RF, ESR, ACE all normal    Social History   Social  History  . Marital status: Single    Spouse name: N/A  . Number of children: 2  . Years of education: N/A   Occupational History  . culinary/management    Social History Main Topics  . Smoking status: Never Smoker  . Smokeless tobacco: Never Used  . Alcohol use No  . Drug use: Yes     Comment: occasional marijuana  . Sexual activity: Yes   Other Topics Concern  . None   Social History Narrative   Lives with daughter who is 53 months old and son who is 29 years old    ECHO:Study Conclusions-02/13/17  - Left ventricle: The cavity size was severely dilated. Wall   thickness was normal. Systolic function was severely reduced. The   estimated ejection fraction was in the range of 20% to 25%.   Diffuse hypokinesis. Left ventricular diastolic function   parameters were normal. - Mitral valve: There was moderate to severe regurgitation. Valve   area by pressure half-time: 2.34 cm^2. - Left atrium: The atrium was severely dilated. - Atrial septum: No defect or patent foramen ovale was identified.  ------------------------------------------------------------------- Study data:  Comparison was made to the study of 03/10/2013.  Study status:  Routine.  Procedure:  The patient reported no pain pre or post test. Transthoracic echocardiography. Image quality was adequate.  Study completion:  There were no complications. Transthoracic echocardiography.  M-mode, complete 2D, spectral Doppler, and color Doppler.  Birthdate:  Patient birthdate: 1975-10-29.  Age:  Patient is 41 yr old.  Sex:  Gender: male. BMI: 29.3 kg/m^2.  Blood pressure:  146/100  Patient status: Inpatient.  Study date:  Study date: 02/13/2017. Study time: 10:53 AM.  Location:  Bedside.  BNP    Component Value Date/Time   BNP 551.1 (H) 02/11/2017 0235    ProBNP    Component Value Date/Time   PROBNP 11.4 07/10/2011 0930     Education Assessment and Provision:  Detailed education and instructions  provided on heart failure disease management including the following:  Signs and symptoms of Heart Failure When to call the physician Importance of daily weights Low sodium diet Fluid restriction Medication management Anticipated future follow-up appointments  Patient education given on each of the above topics.  Patient acknowledges understanding and acceptance of all instructions.  I spoke with Mr. Gahm regarding his HF and current hospitalization.  He tells me that he has just moved back to town from [place].  He previously followed with the AHF clinic and is asking about seeing Dr Haroldine Laws.  I have arranged a post- discharge follow-up appt with the AHF Clinic for him.  We reviewed the importance of daily weights and when to contact the physician.  I also discussed a low sodium diet and high sodium foods to avoid.  He tells me that he knows he has been "drinking too many fluids" in the heat and during his move--led to his "fluid issues".  He denies any issues with getting or taking prescribed medications normally.  He also says that he is already in the process of changing Medicaid back to [facility] Medicaid.  He does request assistance with obtaining a PCP.  I will also make a referral to the HF Clinic LCSW for assistance with PCP after discharge.  Education Materials:  "Living Better With Heart Failure" Booklet, Daily Weight Tracker Tool   High Risk Criteria for Readmission and/or Poor Patient Outcomes:   EF <30%- yes -20-25%  2 or more admissions in 6 months-1/6 mo  Difficult social situation- denies --recently relocated  Demonstrates medication noncompliance- denies   Barriers of Care:  Knowledge and compliance  Discharge Planning:   Plans to return to home in [place] with girlfriend and 41 year old and 69 year old.

## 2017-02-13 NOTE — Progress Notes (Signed)
PROGRESS NOTE    Trentyn Ekholm  NTI:[id number] DOB: [birth year] DOA: 02/11/2017 PCP: Dolores Patty, MD    Brief Narrative:  Tyrone Walker is a 41 y.o. malewho presents with acute on chronic combined systolic and diastolic congestive heart failure. Last Echo showing EF of 20-25% and grade 2 diastolic dysfunction. Questionable medical compliance and dietary indiscretion likely lead to current exacerbation.    Assessment & Plan:   Principal Problem:   Acute on chronic combined systolic and diastolic CHF (congestive heart failure) (HCC) Active Problems:   Non-ischemic cardiomyopathy (HCC)   Chronic systolic heart failure (HCC)   OSA (obstructive sleep apnea)   Hypertension   Diabetes mellitus with complication (HCC)   AKI (acute kidney injury) (HCC)   Acute on chronic diastolic (congestive) heart failure (HCC)   Mitral valve regurgitation: Moderate to severe per 2 d echo 02/13/2017  #1 acute on chronic combined systolic and diastolic heart failure/ NICM ??Etiology.Patient states recently was unable to follow fluid restriction while moving from [place] back to [place].. Patient states otherwise has been compliant with his medications. Point-of-care troponin negative. 2-D echo with EF of 20-25% with diffuse hypokinesis and moderate to severe mitral valve regurgitation with a severely dilated left atrium. Clinical improvement. Patient -5.864 L during this hospitalization. Current weight is 216 pounds from 229 pounds on admission. Continue digoxin, Toprol-XL, entresto, spironolactone. Change IV Lasix to oral lasix>  #2 moderate to severe mitral valvular regurgitation New on 2-D echo. Concerned that this may have caused patient's acute on chronic CHF exacerbation. Case discussed with cardiology. Outpatient follow-up. Continue current medical management.  #3 hypertension Stable. Continue current medical management with Toprol-XL, spironolactone, Lasix,entresto.  #4 diabetes  mellitus type 2 Patient noted to be refusing to be on long-acting insulin wanted to go back on oral regimen of metformin. Is noted that patient has been noncompliant with metformin for 3 months.hemoglobin A1c 11.5.CBGs have ranged from 149-188. Continue sliding scale insulin.   DVT prophylaxis: xarelto Code Status: Full Family Communication: updated patient. No family at bedside. Disposition Plan: home when adequately diuresed with clinical improvement hopefully tomorrow.   Consultants:   none  Procedures:   2-D echo 02/13/2017  Chest x-ray 02/11/2017, 02/12/2017  Antimicrobials:   none   Subjective: Patient states he feels great. Shortness of breath improved significantly. No chest pain.  Objective: Vitals:   02/12/17 2108 02/13/17 0747 02/13/17 0804 02/13/17 1222  BP: (!) 124/107 (!) 146/100  (!) 125/107  Pulse: 98 93  90  Resp: 19 19  18   Temp: 99.5 F (37.5 C) 98.3 F (36.8 C)  98.2 F (36.8 C)  TempSrc: Oral Oral  Oral  SpO2: 100% 99%  100%  Weight:   98.3 kg (216 lb 11.2 oz)   Height:        Intake/Output Summary (Last 24 hours) at 02/13/17 1302 Last data filed at 02/13/17 0734  Gross per 24 hour  Intake              240 ml  Output             1000 ml  Net             -760 ml   Filed Weights   02/11/17 1639 02/12/17 0600 02/13/17 0804  Weight: 101 kg (222 lb 9.6 oz) 99 kg (218 lb 3.2 oz) 98.3 kg (216 lb 11.2 oz)    Examination:  General exam: Appears calm and comfortable  Respiratory system: Clear to auscultation. Respiratory  effort normal. Cardiovascular system: S1 & S2 heard, RRR. 2-3/6 SEM at apex. No JVD, murmurs, rubs, gallops or clicks. No pedal edema. Gastrointestinal system: Abdomen is nondistended, soft and nontender. No organomegaly or masses felt. Normal bowel sounds heard. Central nervous system: Alert and oriented. No focal neurological deficits. Extremities: Symmetric 5 x 5 power. Skin: No rashes, lesions or ulcers Psychiatry:  Judgement and insight appear normal. Mood & affect appropriate.     Data Reviewed: I have personally reviewed following labs and imaging studies  CBC:  Recent Labs Lab 02/11/17 0235 02/13/17 0927  WBC 6.5 6.2  NEUTROABS  --  3.6  HGB 12.2* 14.0  HCT 37.2* 43.0  MCV 86.1 86.3  PLT 170 201   Basic Metabolic Panel:  Recent Labs Lab 02/11/17 0235 02/11/17 0956 02/12/17 1207 02/13/17 0927  NA 138  --  137 136  K 3.2*  --  4.6 3.9  CL 103  --  103 101  CO2 27  --  26 26  GLUCOSE 198*  --  213* 287*  BUN 14  --  16 15  CREATININE 1.33*  --  1.29* 1.16  CALCIUM 8.5*  --  9.4 9.3  MG  --  1.9  --  1.8   GFR: Estimated Creatinine Clearance: 99.8 mL/min (by C-G formula based on SCr of 1.16 mg/dL). Liver Function Tests:  Recent Labs Lab 02/11/17 0956  AST 31  ALT 28  ALKPHOS 39  BILITOT 1.1  PROT 6.7  ALBUMIN 3.7    Recent Labs Lab 02/11/17 0956  LIPASE 19   No results for input(s): AMMONIA in the last 168 hours. Coagulation Profile: No results for input(s): INR, PROTIME in the last 168 hours. Cardiac Enzymes: No results for input(s): CKTOTAL, CKMB, CKMBINDEX, TROPONINI in the last 168 hours. BNP (last 3 results) No results for input(s): PROBNP in the last 8760 hours. HbA1C:  Recent Labs  02/11/17 0955  HGBA1C 11.5*   CBG:  Recent Labs Lab 02/12/17 1426 02/12/17 1539 02/12/17 2200 02/13/17 0748 02/13/17 1217  GLUCAP 244* 166* 171* 188* 149*   Lipid Profile: No results for input(s): CHOL, HDL, LDLCALC, TRIG, CHOLHDL, LDLDIRECT in the last 72 hours. Thyroid Function Tests:  Recent Labs  02/11/17 0956  TSH 1.049   Anemia Panel: No results for input(s): VITAMINB12, FOLATE, FERRITIN, TIBC, IRON, RETICCTPCT in the last 72 hours. Sepsis Labs: No results for input(s): PROCALCITON, LATICACIDVEN in the last 168 hours.  No results found for this or any previous visit (from the past 240 hour(s)).       Radiology Studies: Dg Chest 2  View  Result Date: 02/12/2017 CLINICAL DATA:  CHF. EXAM: CHEST  2 VIEW COMPARISON:  02/11/2017 FINDINGS: Midline trachea. Moderate cardiomegaly. No pleural effusion or pneumothorax. Resolved interstitial edema. No lobar consolidation. IMPRESSION: Cardiomegaly with resolution of congestive heart failure. Electronically Signed   By: Jeronimo Greaves M.D.   On: 02/12/2017 08:53        Scheduled Meds: . digoxin  0.125 mg Oral Daily  . furosemide  40 mg Intravenous Daily  . insulin aspart  0-9 Units Subcutaneous TID WC  . living well with diabetes book   Does not apply Once  . metoprolol succinate  100 mg Oral BID  . rivaroxaban  20 mg Oral Q supper  . sacubitril-valsartan  1 tablet Oral BID  . sodium chloride flush  3 mL Intravenous Q12H  . spironolactone  25 mg Oral Daily   Continuous Infusions: . sodium  chloride       LOS: 0 days    Time spent: 35 minutes    THOMPSON,DANIEL, MD [facility] Pager [phone]  If 7PM-7AM, please contact night-coverage [email or web address] Password [email or web address] 02/13/2017, 1:02 PM

## 2017-02-13 NOTE — Progress Notes (Signed)
  Echocardiogram 2D Echocardiogram has been performed.  Tyrone Walker 02/13/2017, 12:21 PM

## 2017-02-14 LAB — LIPID PANEL
CHOL/HDL RATIO: 3.8 ratio
CHOLESTEROL: 110 mg/dL (ref 0–200)
HDL: 29 mg/dL — ABNORMAL LOW (ref >40)
LDL Cholesterol: 58 mg/dL (ref 0–99)
Triglycerides: 116 mg/dL (ref <150)
VLDL: 23 mg/dL (ref 0–40)

## 2017-02-14 LAB — CBC
HCT: 45.1 % (ref 39.0–52.0)
Hemoglobin: 15.1 g/dL (ref 13.0–17.0)
MCH: 28.8 pg (ref 26.0–34.0)
MCHC: 33.5 g/dL (ref 30.0–36.0)
MCV: 86.1 fL (ref 78.0–100.0)
PLATELETS: 215 10*3/uL (ref 150–400)
RBC: 5.24 MIL/uL (ref 4.22–5.81)
RDW: 13 % (ref 11.5–15.5)
WBC: 7.7 10*3/uL (ref 4.0–10.5)

## 2017-02-14 LAB — GLUCOSE, CAPILLARY
GLUCOSE-CAPILLARY: 196 mg/dL — AB (ref 65–99)
GLUCOSE-CAPILLARY: 261 mg/dL — AB (ref 65–99)
Glucose-Capillary: 205 mg/dL — ABNORMAL HIGH (ref 65–99)

## 2017-02-14 LAB — BASIC METABOLIC PANEL
Anion gap: 7 (ref 5–15)
BUN: 17 mg/dL (ref 6–20)
CALCIUM: 9.3 mg/dL (ref 8.9–10.3)
CO2: 29 mmol/L (ref 22–32)
Chloride: 100 mmol/L — ABNORMAL LOW (ref 101–111)
Creatinine, Ser: 1.2 mg/dL (ref 0.61–1.24)
GFR calc Af Amer: >60 mL/min (ref >60)
Glucose, Bld: 231 mg/dL — ABNORMAL HIGH (ref 65–99)
POTASSIUM: 3.7 mmol/L (ref 3.5–5.1)
SODIUM: 136 mmol/L (ref 135–145)

## 2017-02-14 MED ORDER — ONDANSETRON HCL 4 MG/2ML IJ SOLN
4.0000 mg | Freq: Four times a day (QID) | INTRAMUSCULAR | Status: DC | PRN
  Administered 2017-02-14: 4 mg via INTRAVENOUS

## 2017-02-14 MED ORDER — FUROSEMIDE 40 MG PO TABS: 40.0000 mg | ORAL_TABLET | Freq: Every day | ORAL | 0 refills | Status: DC

## 2017-02-14 MED ORDER — POLYETHYLENE GLYCOL 3350 17 G PO PACK: 17.0000 g | PACK | Freq: Every day | ORAL | 0 refills | Status: DC | PRN

## 2017-02-14 MED ORDER — GLIPIZIDE 5 MG PO TABS: 5.0000 mg | ORAL_TABLET | Freq: Two times a day (BID) | ORAL | 0 refills | Status: DC

## 2017-02-14 MED ORDER — ATORVASTATIN CALCIUM 40 MG PO TABS
40.0000 mg | ORAL_TABLET | Freq: Every day | ORAL | Status: DC
  Administered 2017-02-14: 40 mg via ORAL
  Filled 2017-02-14: qty 1

## 2017-02-14 MED ORDER — POLYETHYLENE GLYCOL 3350 17 G PO PACK
17.0000 g | PACK | Freq: Two times a day (BID) | ORAL | Status: DC
  Administered 2017-02-14: 17 g via ORAL
  Filled 2017-02-14: qty 1

## 2017-02-14 MED ORDER — ATORVASTATIN CALCIUM 40 MG PO TABS: 40.0000 mg | ORAL_TABLET | Freq: Every day | ORAL | 0 refills | Status: DC

## 2017-02-14 MED ORDER — SORBITOL 70 % SOLN
30.0000 mL | Freq: Once | Status: AC
  Administered 2017-02-14: 30 mL via ORAL
  Filled 2017-02-14: qty 30

## 2017-02-14 MED ORDER — INSULIN GLARGINE 100 UNIT/ML ~~LOC~~ SOLN
5.0000 [IU] | SUBCUTANEOUS | Status: DC
Start: 2017-02-14 — End: 2017-02-14
  Administered 2017-02-14: 5 [IU] via SUBCUTANEOUS
  Filled 2017-02-14 (×2): qty 0.05

## 2017-02-14 NOTE — Discharge Summary (Addendum)
Physician Discharge Summary  Tyrone Walker UJW:[id number] DOB: 07/27/1975 DOA: 02/11/2017  PCP: Dolores Patty, MD  Admit date: 02/11/2017 Discharge date: 02/14/2017  Time spent: 65 minutes  Recommendations for Outpatient Follow-up:  1. Follow-up with PCP in 2 weeks. On follow-up patient's diabetes will need to be reassessed as patient's hemoglobin A1c is 11.5. Patient was hesitant to be discharged on insulin until he was seen in the outpatient setting and as such patient will be discharged on oral glipizide. Patient will need a basic metabolic profile done on follow-up to follow-up on electrolytes and renal function. 2. Follow-up with Tonye Becket, NP heart failure clinic on 02/22/2017.   Discharge Diagnoses:  Principal Problem:   Acute on chronic combined systolic and diastolic CHF (congestive heart failure) (HCC) Active Problems:   Non-ischemic cardiomyopathy (HCC)   Chronic systolic heart failure (HCC)   OSA (obstructive sleep apnea)   Hypertension   Diabetes mellitus with complication (HCC)   AKI (acute kidney injury) (HCC)   Acute on chronic diastolic (congestive) heart failure (HCC)   Mitral valve regurgitation: Moderate to severe per 2 d echo 02/13/2017   Discharge Condition: Stable and improved  Diet recommendation: Heart healthy  Filed Weights   02/12/17 0600 02/13/17 0804 02/14/17 0505  Weight: 99 kg (218 lb 3.2 oz) 98.3 kg (216 lb 11.2 oz) 97.2 kg (214 lb 3.2 oz)    History of present illness:  Per Dr Konrad Dolores Mihailo Sage is a 41 y.o. male with medical history significant for Diabetes diet controlled, Paroxysmal Atrial Fibrillation on Xarelto, HTN. NICM with EF 25 %, combined systolic and diastolic CHF, presenting to the ED with persistent, progressively worsening severe shortness of breath waking him up at midnight from his sleep, accompanied by substernal chest discomfort, nausea and mild epigastric pain and distention. The patient has recently moved from  [place] about 2 weeks ago,has not been able to continue his medical care here, including following at the CHF clinic. He did admit to food indiscretion with salt rich foods.Denied productive  sputum. Denied rhinorrhea or hemoptysis. Denied fevers, chills, night sweats or mucositis. Reported fevers , chills and night sweats. Denied any  palpitations.  Reported mild lower extremity swelling on presentation. He has been compliant with his new meds until 2 days prior to admission, admitting forgetting to take his scheduled meds. Denied any syncope or presyncope but did feel fatigued. No tobacco or ETOH, some use of marijuana. Per chart report, the patient had his last follow-up with heart failure clinic in [place] in 2014. Chest x-ray done on admission was consistent with pulmonary edema. Patient was admitted for acute CHF exacerbation.  Hospital Course:  #1 acute on chronic combined systolic and diastolic heart failure/ NICM ??Etiology.Patient states recently was unable to follow fluid restriction while moving from [place] back to [place].. Patient states otherwise has been compliant with his medications. Point-of-care troponin negative. 2-D echo with EF of 20-25% with diffuse hypokinesis and moderate to severe mitral valve regurgitation with a severely dilated left atrium.  Patient was diuresed with IV Lasix and was -7.2 L during this hospitalization. Patient's weight on discharge was 214 pounds from 229 pounds on admission. Patient was subsequent transitioned from IV Lasix to oral Lasix 40 mg daily. Patient was maintained on home regimen of digoxin, Toprol-XL, entresto, spironolactone.  #2 moderate to severe mitral valvular regurgitation New on 2-D echo. Concerned that this may have caused patient's acute on chronic CHF exacerbation. Case discussed with cardiology. Patient was maintained on Toprol-XL,  spironolactone, Lasix and entresto.   #3 hypertension Stable. Patient was maintained on  home regimen of Toprol-XL, spironolactone, Lasix,entresto.  #4 diabetes mellitus type 2 Patient noted to be refusing to be on long-acting insulin wanted to go back on oral regimen of metformin. Is noted that patient has been noncompliant with metformin for 3 months.hemoglobin A1c 11.5. Patient was maintained on sliding scale insulin during the hospitalization. Due to patient's CHF patient be discharged on glipizide 5 mg daily and to follow-up with PCP in the outpatient setting were further management of his diabetes can be determined. Patient will be best served off on insulin.    Procedures:  2-D echo 02/13/2017  Chest x-ray 02/11/2017, 02/12/2017  Consultations:  None  Discharge Exam: Vitals:   02/14/17 1004 02/14/17 1309  BP: (!) 144/98 (!) 129/94  Pulse: 98 86  Resp:  18  Temp:  98.6 F (37 C)  SpO2: 97% 100%    General: NAD Cardiovascular: RRR Respiratory: CTAB  Discharge Instructions   Discharge Instructions    Ambulatory referral to Nutrition and Diabetic Education    Complete by:  As directed    Diet - low sodium heart healthy    Complete by:  As directed    Increase activity slowly    Complete by:  As directed      Current Discharge Medication List    START taking these medications   Details  atorvastatin (LIPITOR) 40 MG tablet Take 1 tablet (40 mg total) by mouth daily at 6 PM. Qty: 30 tablet, Refills: 0    glipiZIDE (GLUCOTROL) 5 MG tablet Take 1 tablet (5 mg total) by mouth 2 (two) times daily. Qty: 60 tablet, Refills: 0    polyethylene glycol (MIRALAX / GLYCOLAX) packet Take 17 g by mouth daily as needed for moderate constipation. Qty: 14 each, Refills: 0      CONTINUE these medications which have CHANGED   Details  furosemide (LASIX) 40 MG tablet Take 1 tablet (40 mg total) by mouth daily. Qty: 30 tablet, Refills: 0      CONTINUE these medications which have NOT CHANGED   Details  digoxin (LANOXIN) 0.125 MG tablet Take 0.125 mg by  mouth daily.    metoprolol succinate (TOPROL-XL) 100 MG 24 hr tablet Take 100 mg by mouth 2 (two) times daily. Take with or immediately following a meal.    rivaroxaban (XARELTO) 20 MG TABS tablet Take 20 mg by mouth daily with supper.    sacubitril-valsartan (ENTRESTO) 24-26 MG Take 1 tablet by mouth 2 (two) times daily.    spironolactone (ALDACTONE) 25 MG tablet Take 1 tablet (25 mg total) by mouth daily. Qty: 30 tablet, Refills: 6      STOP taking these medications     cetirizine (ZYRTEC) 10 MG tablet        No Known Allergies Follow-up Information    Clegg, Amy D, NP. Go on 02/22/2017.   Specialty:  Cardiology Why:  At 0900--in the [facility]--gate code--7001.  Please bring all medications  Contact information: [address] [place] [address] [phone]        PCP. Schedule an appointment as soon as possible for a visit in 2 week(s).            The results of significant diagnostics from this hospitalization (including imaging, microbiology, ancillary and laboratory) are listed below for reference.    Significant Diagnostic Studies: Dg Chest 2 View  Result Date: 02/12/2017 CLINICAL DATA:  CHF.  EXAM: CHEST  2 VIEW COMPARISON:  02/11/2017 FINDINGS: Midline trachea. Moderate cardiomegaly. No pleural effusion or pneumothorax. Resolved interstitial edema. No lobar consolidation. IMPRESSION: Cardiomegaly with resolution of congestive heart failure. Electronically Signed   By: Jeronimo Greaves M.D.   On: 02/12/2017 08:53   Dg Chest 2 View  Result Date: 02/11/2017 CLINICAL DATA:  Shortness of breath.  Cough and congestion. EXAM: CHEST  2 VIEW COMPARISON:  Radiographs 11/19/2010 FINDINGS: Chronic cardiomegaly appears similar to prior exam. Unchanged mediastinal contours. Worsening pulmonary edema from prior exam. No definite pleural fluid. Streaky bibasilar opacities are likely atelectasis. No pneumothorax. No acute osseous abnormality. IMPRESSION:  Pulmonary edema, increased from most recent comparison exam of 2012. Stable chronic cardiomegaly. Streaky bibasilar opacities are likely atelectasis. Electronically Signed   By: Rubye Oaks M.D.   On: 02/11/2017 04:10    Microbiology: No results found for this or any previous visit (from the past 240 hour(s)).   Labs: Basic Metabolic Panel:  Recent Labs Lab 02/11/17 0235 02/11/17 0956 02/12/17 1207 02/13/17 0927 02/14/17 0452  NA 138  --  137 136 136  K 3.2*  --  4.6 3.9 3.7  CL 103  --  103 101 100*  CO2 27  --  26 26 29   GLUCOSE 198*  --  213* 287* 231*  BUN 14  --  16 15 17   CREATININE 1.33*  --  1.29* 1.16 1.20  CALCIUM 8.5*  --  9.4 9.3 9.3  MG  --  1.9  --  1.8  --    Liver Function Tests:  Recent Labs Lab 02/11/17 0956  AST 31  ALT 28  ALKPHOS 39  BILITOT 1.1  PROT 6.7  ALBUMIN 3.7    Recent Labs Lab 02/11/17 0956  LIPASE 19   No results for input(s): AMMONIA in the last 168 hours. CBC:  Recent Labs Lab 02/11/17 0235 02/13/17 0927 02/14/17 0452  WBC 6.5 6.2 7.7  NEUTROABS  --  3.6  --   HGB 12.2* 14.0 15.1  HCT 37.2* 43.0 45.1  MCV 86.1 86.3 86.1  PLT 170 201 215   Cardiac Enzymes: No results for input(s): CKTOTAL, CKMB, CKMBINDEX, TROPONINI in the last 168 hours. BNP: BNP (last 3 results)  Recent Labs  02/11/17 0235  BNP 551.1*    ProBNP (last 3 results) No results for input(s): PROBNP in the last 8760 hours.  CBG:  Recent Labs Lab 02/13/17 1708 02/13/17 2138 02/14/17 0749 02/14/17 1129 02/14/17 1652  GLUCAP 221* 211* 205* 196* 261*       Signed:  Conor Lata MD.  [facility] 02/14/2017, 5:58 PM

## 2017-02-18 ENCOUNTER — Telehealth (HOSPITAL_COMMUNITY): Payer: Self-pay | Admitting: Surgery

## 2017-02-18 MED ORDER — METOPROLOL SUCCINATE ER 100 MG PO TB24: 100.0000 mg | ORAL_TABLET | Freq: Two times a day (BID) | ORAL | 3 refills | Status: DC

## 2017-02-18 MED ORDER — SPIRONOLACTONE 25 MG PO TABS: 25.0000 mg | ORAL_TABLET | Freq: Every day | ORAL | 3 refills | Status: DC

## 2017-02-18 MED ORDER — RIVAROXABAN 20 MG PO TABS: 20.0000 mg | ORAL_TABLET | Freq: Every day | ORAL | 3 refills | Status: DC

## 2017-02-18 MED ORDER — DIGOXIN 125 MCG PO TABS: 0.1250 mg | ORAL_TABLET | Freq: Every day | ORAL | 3 refills | Status: DC

## 2017-02-18 NOTE — Telephone Encounter (Signed)
Patient recently moved back to [place] from [place].  He has a follow-up appt scheduled with [facility] Clinic on 02/22/17.  He is out of several medications.  I have sent refills for Spironlactone, Digoxin, Toprol, and Xarelto to [facility] at [employer] per patient request.

## 2017-02-21 ENCOUNTER — Telehealth (HOSPITAL_COMMUNITY): Payer: Self-pay

## 2017-02-21 MED FILL — ATORVASTATIN 40 MG TABLET: 40 | 34 days supply | Qty: 34 | Fill #0

## 2017-02-21 MED FILL — FUROSEMIDE 40 MG TABLET: 40 | 34 days supply | Qty: 34 | Fill #0

## 2017-02-21 MED FILL — DIGOXIN 0.125 MG TABLET: 125 | 34 days supply | Qty: 34 | Fill #0

## 2017-02-21 MED FILL — SPIRONOLACTONE 25 MG TABLET: 25 | 34 days supply | Qty: 34 | Fill #0

## 2017-02-21 MED FILL — METOPROLOL SUCC ER 100 MG T: 100 | 34 days supply | Qty: 68 | Fill #0

## 2017-02-21 NOTE — Telephone Encounter (Signed)
Pt. Called this am due to not having meds on further investigation he had them sent to the wrong pharmacy. When he went to purchuse meds he realized meds were too expensive due to out of state medicaid. I set him up to receive 1 month supply of lasix, spirolactone, metoprolol, digoxin, and atrovastatin. Medication Samples have been provided to the patient.   Samples left up front of: Drug name: Entresto        Strength:24/26        Qty :2  LOT: [id number]  Exp.Date: 12/20 Dosing instructions: 1 tab 2 times dailt  The patient has been instructed regarding the correct time, dose, and frequency of taking this medication, including desired effects and most common side effects.   Medication Samples have been provided to the patient.  Drug name:xarelto     Strength: 20 mg        Qty: 2  LOT: [id number]  Exp.Date: 12/20  Dosing instructions: 20 mg (1 tab) daily with dinner  The patient has been instructed regarding the correct time, dose, and frequency of taking this medication, including desired effects and most common side effects.   Teresa Coombs 2:43 PM 02/21/2017   Teresa Coombs 2:20 PM 02/21/2017

## 2017-02-21 NOTE — Progress Notes (Addendum)
Patient ID: Tyrone Walker, male   DOB: [birth year], 41 y.o.   MRN: [id number]  History of Present Illness: PCP:  Primary HF Cardiologist:  Dr. Glori Bickers  Tyrone Walker is a 41 y.o. 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 20%.   Cath 11/2010 showed normal cors.  He had serology done to evaluate etiology of his myopathy.  All titers were negative except for Coxsackie B2 titer was elevated.   Admitted 8/20 through 02/14/2017 with increased SOB. Diuresed with IV lasix. ECHO with reduced EF 20-25%. Discharge weight 214 pounds.   Today he returns for post hospital follow up. He has not been seen in the HF clinic since 2014. He just moved back to [place] from [place]. Overall feeling ok. Weight at home 218-222 pounds.   He had CPX 02/05/11: pVO2 23.6 (71%)  Ve/VCO2 27 O2 pulse 80% RER 1.25 VE/MVV 56%  06/2011: LVEF 35-40%, diffuse hypokinesis.  Grade 1 diastolic dysfunction.   01/24/12: LVEF 08-65%, grade 1 diastolic dysfunction.  11/13: ECHO EF 50-55%  03/20/13: EF 30%  01/2017: EF 20-25%.     Past Medical History:  Diagnosis Date  . Acute on chronic systolic (congestive) heart failure ([place]) 02/11/2017  . Chronic systolic heart failure ([place])   . Colitis 10/12/10   ER visit  . Elevated LFTs    likely hepatic congestion from CHF  . history Marijuana smoker   . Hypertension 10/18/2011  . Mitral valve regurgitation: Moderate to severe per 2 d echo 02/13/2017 02/13/2017  . Non-ischemic cardiomyopathy ([place]) 10/20/10   a. cath 11/22/10: normal cors; EF 15-20%;  b. echo 11/19/10: EF 20%, mild MR, mild LAE, PASP 48; c. dx 5/12, tx with IV milrinone for low output, Coxsackie B2 titer elevated; o/w HIV, CMV, RMSF, ANA, RF, ESR, ACE all normal    Current Outpatient Prescriptions  Medication Sig Dispense Refill  . atorvastatin (LIPITOR) 40 MG tablet Take 1 tablet (40 mg total) by mouth daily at 6 PM. 30 tablet 0  . digoxin (LANOXIN) 0.125 MG tablet Take 1 tablet  (0.125 mg total) by mouth daily. 30 tablet 3  . furosemide (LASIX) 40 MG tablet Take 1 tablet (40 mg total) by mouth daily. 30 tablet 0  . glipiZIDE (GLUCOTROL) 5 MG tablet Take 1 tablet (5 mg total) by mouth 2 (two) times daily. 60 tablet 0  . metoprolol succinate (TOPROL-XL) 100 MG 24 hr tablet Take 1 tablet (100 mg total) by mouth 2 (two) times daily. Take with or immediately following a meal. 60 tablet 3  . polyethylene glycol (MIRALAX / GLYCOLAX) packet Take 17 g by mouth daily as needed for moderate constipation. 14 each 0  . rivaroxaban (XARELTO) 20 MG TABS tablet Take 1 tablet (20 mg total) by mouth daily with supper. 30 tablet 3  . sacubitril-valsartan (ENTRESTO) 24-26 MG Take 1 tablet by mouth 2 (two) times daily. 60 tablet 5  . spironolactone (ALDACTONE) 25 MG tablet Take 1 tablet (25 mg total) by mouth daily. 30 tablet 3   No current facility-administered medications for this encounter.     Allergies: No Known Allergies   ROS:  All pertinent positives and negatives as in HPI, otherwise negative  Vital Signs: Vitals:   02/22/17 0917  BP: (!) 138/92  Pulse: 96  SpO2: 96%  Weight: 222 lb 9.6 oz (101 kg)   Filed Weights   02/22/17 0917  Weight: 222 lb 9.6 oz (101 kg)   PHYSICAL  EXAM: General:  Well appearing. No resp difficulty HEENT: normal Neck: supple. no JVD. Carotids 2+ bilat; no bruits. No lymphadenopathy or thryomegaly appreciated. Cor: PMI nondisplaced. Regular rate & rhythm. No rubs, gallops or murmurs. Lungs: clear Abdomen: soft, nontender, nondistended. No hepatosplenomegaly. No bruits or masses. Good bowel sounds. Extremities: no cyanosis, clubbing, rash, edema Neuro: alert & orientedx3, cranial nerves grossly intact. moves all 4 extremities w/o difficulty. Affect pleasant  EKG: NSR 91 bpm with PVCs  ASSESSMENT AND PLAN: 1. Chronic systolic HF due to NICM -0/1484 EF 20-25%. Plan to repeat ECHO after HF meds optimzed   - NYHA I. Volume status stable.  Continue lasix 40 mg daily. Continue current dose bb, entresto, spiro, and dig Check BMEt and dig level today.  I am not going to titrate meds today because he hasnt taken any meds today. I have asked him to take all meds prior to the next visit.  2. HTN Elelvated but has not had meds  3. OSA -Has not had CPAP since 2014. Refer to Dr Radford Pax for sleep study.   4. PAF NSR today. Continue xarelto.   5. Uncontrolled DM- hgb A1C 11.5 Refer to PCP today.   Follow up in 2 weeks.   Amy Clegg, 9:29 AM

## 2017-02-22 ENCOUNTER — Ambulatory Visit (HOSPITAL_COMMUNITY)
Admission: RE | Admit: 2017-02-22 | Discharge: 2017-02-22 | Disposition: A | Discharge status: Home / Self Care | Payer: Medicaid - Out of State | Source: Ambulatory Visit | Attending: Cardiology | Admitting: Cardiology

## 2017-02-22 VITALS — BP 138/92 | HR 96 | Wt 222.6 lb

## 2017-02-22 DIAGNOSIS — I5022 Chronic systolic (congestive) heart failure: Secondary | ICD-10-CM | POA: Insufficient documentation

## 2017-02-22 DIAGNOSIS — Z7901 Long term (current) use of anticoagulants: Secondary | ICD-10-CM | POA: Insufficient documentation

## 2017-02-22 DIAGNOSIS — Z7984 Long term (current) use of oral hypoglycemic drugs: Secondary | ICD-10-CM | POA: Diagnosis not present

## 2017-02-22 DIAGNOSIS — I10 Essential (primary) hypertension: Secondary | ICD-10-CM | POA: Diagnosis not present

## 2017-02-22 DIAGNOSIS — G4733 Obstructive sleep apnea (adult) (pediatric): Secondary | ICD-10-CM | POA: Insufficient documentation

## 2017-02-22 DIAGNOSIS — Z79899 Other long term (current) drug therapy: Secondary | ICD-10-CM | POA: Insufficient documentation

## 2017-02-22 DIAGNOSIS — I110 Hypertensive heart disease with heart failure: Secondary | ICD-10-CM | POA: Diagnosis not present

## 2017-02-22 DIAGNOSIS — I480 Paroxysmal atrial fibrillation: Secondary | ICD-10-CM | POA: Insufficient documentation

## 2017-02-22 DIAGNOSIS — I428 Other cardiomyopathies: Secondary | ICD-10-CM | POA: Insufficient documentation

## 2017-02-22 DIAGNOSIS — E1165 Type 2 diabetes mellitus with hyperglycemia: Secondary | ICD-10-CM | POA: Insufficient documentation

## 2017-02-22 LAB — BASIC METABOLIC PANEL
Anion gap: 9 (ref 5–15)
BUN: 15 mg/dL (ref 6–20)
CALCIUM: 9.1 mg/dL (ref 8.9–10.3)
CO2: 27 mmol/L (ref 22–32)
CREATININE: 1.19 mg/dL (ref 0.61–1.24)
Chloride: 103 mmol/L (ref 101–111)
Glucose, Bld: 297 mg/dL — ABNORMAL HIGH (ref 65–99)
Potassium: 3.6 mmol/L (ref 3.5–5.1)
SODIUM: 139 mmol/L (ref 135–145)

## 2017-02-22 LAB — DIGOXIN LEVEL: DIGOXIN LVL: 0.4 ng/mL — AB (ref 0.8–2.0)

## 2017-02-22 MED ORDER — SACUBITRIL-VALSARTAN 24-26 MG PO TABS: 1.0000 | ORAL_TABLET | Freq: Two times a day (BID) | ORAL | 5 refills | Status: DC

## 2017-02-22 MED ORDER — RIVAROXABAN 20 MG PO TABS: 20.0000 mg | ORAL_TABLET | Freq: Every day | ORAL | 3 refills | Status: DC

## 2017-02-22 MED ORDER — ATORVASTATIN CALCIUM 40 MG PO TABS: 40.0000 mg | ORAL_TABLET | Freq: Every day | ORAL | 3 refills | Status: DC

## 2017-02-22 MED ORDER — SACUBITRIL-VALSARTAN 24-26 MG PO TABS: 1.0000 | ORAL_TABLET | Freq: Two times a day (BID) | ORAL | 3 refills | Status: DC

## 2017-02-22 NOTE — Progress Notes (Signed)
Advanced Heart Failure Medication Review by a Pharmacist  Does the patient  feel that his/her medications are working for him/her?  yes  Has the patient been experiencing any side effects to the medications prescribed?  no  Does the patient measure his/her own blood pressure or blood glucose at home?  no   Does the patient have any problems obtaining medications due to transportation or finances?   No - now has [place] Medicaid instated last Tuesday   Understanding of regimen: good Understanding of indications: good Potential of compliance: good Patient understands to avoid NSAIDs. Patient understands to avoid decongestants.  Issues to address at subsequent visits: None   Pharmacist comments: Tyrone Walker is a pleasant 41 yo M presenting with his medication bottles. He reports good compliance with his regimen and did not have any specific medication-related questions or concerns for me at this time.   [name]. Bonnye Fava, PharmD, BCPS, CPP Clinical Pharmacist Pager: [phone] Phone: [phone] 02/22/2017 9:21 AM      Time with patient: 10 minutes Preparation and documentation time: 10 minutes Total time: 20 minutes

## 2017-02-22 NOTE — Patient Instructions (Signed)
Labs today We will only contact you if something comes back abnormal or we need to make some changes. Otherwise no news is good news!   Your physician has recommended that you have a sleep study. This test records several body functions during sleep, including: brain activity, eye movement, oxygen and carbon dioxide blood levels, heart rate and rhythm, breathing rate and rhythm, the flow of air through your mouth and nose, snoring, body muscle movements, and chest and belly movement.    You have been referred to Dr. Mayford Knife for sleep evaluation   Your physician recommends that you schedule a follow-up appointment in: 2 weeks with Amy Clegg,NP   Do the following things EVERYDAY: 1) Weigh yourself in the morning before breakfast. Write it down and keep it in a log. 2) Take your medicines as prescribed 3) Eat low salt foods-Limit salt (sodium) to 2000 mg per day.  4) Stay as active as you can everyday 5) Limit all fluids for the day to less than 2 liters

## 2017-02-26 ENCOUNTER — Telehealth: Payer: Self-pay | Admitting: Licensed Clinical Social Worker

## 2017-02-26 ENCOUNTER — Telehealth (HOSPITAL_COMMUNITY): Payer: Self-pay | Admitting: Pharmacist

## 2017-02-26 NOTE — Telephone Encounter (Signed)
CSW referred to assist with PCP. Patient reports he recently was approved for [facility] medicaid as he moved from [place]. CSW explained typically PCP is auto signed by medicaid pending on type of medicaid. Patient reports he has not received his card yet but will contact medicaid worker and make request for PCP. Patient will return call to CSW if needed. Lasandra Beech, LCSW, CCSW-MCS [phone]

## 2017-02-26 NOTE — Telephone Encounter (Signed)
Entresto 24-26 mg BID PA approved by [place] Medicaid through 02/17/18.   [name]. Bonnye Fava, PharmD, BCPS, CPP Clinical Pharmacist Pager: [phone] Phone: [phone] 02/26/2017 11:45 AM

## 2017-03-08 ENCOUNTER — Encounter (HOSPITAL_COMMUNITY): Payer: Medicaid - Out of State

## 2017-03-19 ENCOUNTER — Ambulatory Visit: Payer: Medicaid - Out of State | Admitting: Registered"

## 2017-04-19 ENCOUNTER — Ambulatory Visit (HOSPITAL_BASED_OUTPATIENT_CLINIC_OR_DEPARTMENT_OTHER): Payer: Medicaid Other | Attending: Adult Health

## 2017-05-08 ENCOUNTER — Telehealth (HOSPITAL_COMMUNITY): Payer: Self-pay

## 2017-05-08 NOTE — Telephone Encounter (Signed)
CHF Clinic appointment reminder call placed to patient for upcoming post-hospital follow up.  LVMTCB to confirm apt.  Patient also reminded to take all medications as prescribed on the day of his/her appointment and to bring all medications to this appointment.  Advised to call our office for tardiness or cancellations/rescheduling needs.  .Zanae Kuehnle Genevea  

## 2017-05-09 ENCOUNTER — Ambulatory Visit (HOSPITAL_COMMUNITY)
Admission: RE | Admit: 2017-05-09 | Discharge: 2017-05-09 | Disposition: A | Discharge status: Home / Self Care | Payer: Medicaid Other | Source: Ambulatory Visit | Attending: Internal Medicine | Admitting: Internal Medicine

## 2017-05-09 ENCOUNTER — Encounter (HOSPITAL_COMMUNITY): Payer: Self-pay

## 2017-05-09 ENCOUNTER — Telehealth: Payer: Self-pay | Admitting: Licensed Clinical Social Worker

## 2017-05-09 VITALS — BP 130/90 | HR 106 | Wt 224.0 lb

## 2017-05-09 DIAGNOSIS — I5022 Chronic systolic (congestive) heart failure: Secondary | ICD-10-CM

## 2017-05-09 DIAGNOSIS — I480 Paroxysmal atrial fibrillation: Secondary | ICD-10-CM | POA: Diagnosis not present

## 2017-05-09 DIAGNOSIS — Z7901 Long term (current) use of anticoagulants: Secondary | ICD-10-CM | POA: Insufficient documentation

## 2017-05-09 DIAGNOSIS — Z7984 Long term (current) use of oral hypoglycemic drugs: Secondary | ICD-10-CM | POA: Diagnosis not present

## 2017-05-09 DIAGNOSIS — E119 Type 2 diabetes mellitus without complications: Secondary | ICD-10-CM | POA: Insufficient documentation

## 2017-05-09 DIAGNOSIS — I429 Cardiomyopathy, unspecified: Secondary | ICD-10-CM | POA: Diagnosis not present

## 2017-05-09 DIAGNOSIS — I110 Hypertensive heart disease with heart failure: Secondary | ICD-10-CM | POA: Diagnosis present

## 2017-05-09 DIAGNOSIS — G4733 Obstructive sleep apnea (adult) (pediatric): Secondary | ICD-10-CM | POA: Insufficient documentation

## 2017-05-09 LAB — BASIC METABOLIC PANEL WITH GFR
Anion gap: 7 (ref 5–15)
BUN: 9 mg/dL (ref 6–20)
CO2: 27 mmol/L (ref 22–32)
Calcium: 8.9 mg/dL (ref 8.9–10.3)
Chloride: 101 mmol/L (ref 101–111)
Creatinine, Ser: 1.18 mg/dL (ref 0.61–1.24)
GFR calc Af Amer: >60 mL/min
GFR calc non Af Amer: >60 mL/min
Glucose, Bld: 277 mg/dL — ABNORMAL HIGH (ref 65–99)
Potassium: 3.7 mmol/L (ref 3.5–5.1)
Sodium: 135 mmol/L (ref 135–145)

## 2017-05-09 LAB — MAGNESIUM: Magnesium: 1.6 mg/dL — ABNORMAL LOW (ref 1.7–2.4)

## 2017-05-09 LAB — HEMOGLOBIN A1C
Hgb A1c MFr Bld: 12.8 % — ABNORMAL HIGH (ref 4.8–5.6)
MEAN PLASMA GLUCOSE: 320.66 mg/dL

## 2017-05-09 MED ORDER — RIVAROXABAN 20 MG PO TABS: 20.0000 mg | ORAL_TABLET | Freq: Every day | ORAL | 3 refills | Status: DC

## 2017-05-09 MED ORDER — FUROSEMIDE 40 MG PO TABS: 40.0000 mg | ORAL_TABLET | Freq: Every day | ORAL | 3 refills | Status: DC

## 2017-05-09 MED ORDER — SACUBITRIL-VALSARTAN 24-26 MG PO TABS: 1.0000 | ORAL_TABLET | Freq: Two times a day (BID) | ORAL | 3 refills | Status: DC

## 2017-05-09 NOTE — Progress Notes (Signed)
Patient ID: Tyrone Walker, male   DOB: 08/28/1975, 41 y.o.   MRN: [id number]     Advanced Heart Failure Clinic Note   History of Present Illness: Primary HF Cardiologist:  Dr. Glori Bickers  Tyrone Walker is a 41 y.o. 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 20%.   Cath 11/2010 showed normal cors.  He had serology done to evaluate etiology of his myopathy.  All titers were negative except for Coxsackie B2 titer was elevated.   Admitted 8/20 through 02/14/2017 with increased SOB. Diuresed with IV lasix. ECHO with reduced EF 20-25%. Discharge weight 214 pounds.   He presents today for regular follow up. Last seen in HF clinic 02/22/2017. Has received his medicaid card since last visit.   He has been off of Xarelto and Entresto for 2 weeks. He states he is feeling great overall, but can tell he is off of some of his medications. Also out of lasix for 3-4 days.  He states he has not followed up because he has been travelling and had a death in the family. He did not go to establish with his PCP because the office looked like a "ghetto doctor". He is not watching his diet. Not limiting salt or fluid.  Denies CP, orthopnea, or peripheral edema. Has occasional abdominal distention. Weight at home 214-220.  Weight up 2 lbs from last visit by our scales.   CPX 02/05/11: pVO2 23.6 (71%)  Ve/VCO2 27 O2 pulse 80% RER 1.25 VE/MVV 56%  06/2011: LVEF 35-40%, diffuse hypokinesis.  Grade 1 diastolic dysfunction.   01/24/12: LVEF 83-38%, grade 1 diastolic dysfunction.  11/13: ECHO EF 50-55%  03/20/13: EF 30%  01/2017: EF 20-25%.   Past Medical History:  Diagnosis Date  . Acute on chronic systolic (congestive) heart failure ([place]) 02/11/2017  . Chronic systolic heart failure ([place])   . Colitis 10/12/10   ER visit  . Elevated LFTs    likely hepatic congestion from CHF  . history Marijuana smoker   . Hypertension 10/18/2011  . Mitral valve regurgitation: Moderate to severe  per 2 d echo 02/13/2017 02/13/2017  . Non-ischemic cardiomyopathy ([place]) 10/20/10   a. cath 11/22/10: normal cors; EF 15-20%;  b. echo 11/19/10: EF 20%, mild MR, mild LAE, PASP 48; c. dx 5/12, tx with IV milrinone for low output, Coxsackie B2 titer elevated; o/w HIV, CMV, RMSF, ANA, RF, ESR, ACE all normal    Current Outpatient Medications  Medication Sig Dispense Refill  . digoxin (LANOXIN) 0.125 MG tablet Take 1 tablet (0.125 mg total) by mouth daily. 30 tablet 3  . furosemide (LASIX) 40 MG tablet Take 1 tablet (40 mg total) by mouth daily. 30 tablet 0  . glipiZIDE (GLUCOTROL) 5 MG tablet Take 1 tablet (5 mg total) by mouth 2 (two) times daily. 60 tablet 0  . metoprolol succinate (TOPROL-XL) 100 MG 24 hr tablet Take 1 tablet (100 mg total) by mouth 2 (two) times daily. Take with or immediately following a meal. 60 tablet 3  . rivaroxaban (XARELTO) 20 MG TABS tablet Take 1 tablet (20 mg total) by mouth daily with supper. 30 tablet 3  . sacubitril-valsartan (ENTRESTO) 24-26 MG Take 1 tablet by mouth 2 (two) times daily. 60 tablet 3  . spironolactone (ALDACTONE) 25 MG tablet Take 1 tablet (25 mg total) by mouth daily. 30 tablet 3   No current facility-administered medications for this encounter.     Allergies: No Known Allergies  Review of systems complete and found to be negative unless listed in HPI.    Vital Signs: Vitals:   05/09/17 0917  BP: 130/90  Pulse: (!) 106  SpO2: 100%  Weight: 224 lb (101.6 kg)   Wt Readings from Last 3 Encounters:  05/09/17 224 lb (101.6 kg)  02/22/17 222 lb 9.6 oz (101 kg)  02/14/17 214 lb 3.2 oz (97.2 kg)    PHYSICAL EXAM: General: Well appearing. No resp difficulty. HEENT: Normal Neck: Supple. JVP 5-6. Carotids 2+ bilat; no bruits. No thyromegaly or nodule noted. Cor: PMI nondisplaced. RRR, No M/G/R noted Lungs: CTAB, normal effort. Abdomen: Soft, non-tender, non-distended, no HSM. No bruits or masses. +BS  Extremities: No cyanosis, clubbing, or  rash. R and LLE no edema.  Neuro: Alert & orientedx3, cranial nerves grossly intact. moves all 4 extremities w/o difficulty. Affect pleasant   EKG: NSR 98 bpm with PVCs  ASSESSMENT AND PLAN: 1. Chronic systolic HF due to NICM -12/9890 EF 20-25%. Plan to repeat Echo once meds optimized.  Has been relatively non-compliant with clinic follow up.  - NYHA I symptoms - Volume status stable on exam - Resume Entresto 24/26 mg BID. BMET today and 10 days with resumption.  - Resume lasix 40 mg daily - Continue Toprol 100 mg BID - Continue spironolactone 25 mg daily - Continue digoxin 0.125 mg daily. - Reinforced fluid restriction to < 2 L daily, sodium restriction to less than 2000 mg daily, and the importance of daily weights.   2. HTN - Mildly elevated on arrival to clinic, but has been out of his medications as above. 3. OSA -Has not had CPAP since 2014. Has been referred to Dr. Radford Pax for sleep study.   4. PAF - EKG today shows Sinus rhythm with PVC 98 bpm. - Resume Xarelto 20 mg daily. CBC in 10 days along with BMET.  5. Uncontrolled DM - hgb A1C 11.5 01/2017. Repeat today. - Has been referred for PCP but had a "No Show" - Will resend and have CSW help.   Follow up in 3 weeks for med titration with Pharm D. Will have CSW contact concerning compliance and PCP.   Shirley Friar, PA-C  9:19 AM  Greater than 50% of the 25 minute visit was spent in counseling/coordination of care regarding disease state education, salt/fluid restriction, sliding scale diuretics, and medication compliance.

## 2017-05-09 NOTE — Patient Instructions (Addendum)
RESTART Lasix 40 mg tablet once daily.  RESTART Xarelto 20 mg tablet once daily.  RESTART Entresto 24/26 mg tablet twice daily.  Routine lab work today. Will notify you of abnormal results, otherwise no news is good news!  Return in 1 week for labs.  __________________________________________________________ Tyrone Walker Code:  5176  Return in 3-4 weeks with Tyrone Walker CHF clinical pharmacist.  ___________________________________________________________ Tyrone Walker Code: 8002  Follow up with Dr. Gala Romney in 6 weeks.  ___________________________________________________________ Tyrone Walker Code: 9000  Take all medication as prescribed the day of your appointment. Bring all medications with you to your appointment.  Do the following things EVERYDAY: 1) Weigh yourself in the morning before breakfast. Write it down and keep it in a log. 2) Take your medicines as prescribed 3) Eat low salt foods-Limit salt (sodium) to 2000 mg per day.  4) Stay as active as you can everyday 5) Limit all fluids for the day to less than 2 liters

## 2017-05-09 NOTE — Telephone Encounter (Signed)
CSW received referral to assist with PCP and re establishing care. CSW attempted to reach patient with no success. Message left. Lasandra Beech, LCSW, CCSW-MCS [phone]

## 2017-05-10 ENCOUNTER — Telehealth (HOSPITAL_COMMUNITY): Payer: Self-pay

## 2017-05-10 MED ORDER — GLIPIZIDE 5 MG PO TABS: 5.0000 mg | ORAL_TABLET | Freq: Two times a day (BID) | ORAL | 0 refills | Status: DC

## 2017-05-10 NOTE — Telephone Encounter (Signed)
Result Notes for HgB A1c   Notes recorded by Chyrl Civatte, RN on 05/10/2017 at 3:11 PM EST LVMTCB. ------  Notes recorded by Graciella Freer, PA-C on 05/09/2017 at 11:56 AM EST Hgb A1C markedly elevated. Needs urgent referral to PCP vs Endocrinologist with how high is BG is. May need Jackies help with Medicaid accepting PCP.   Please refill his glipizide for 30 days with no refills.   Tyrone Walker [address]" [place], PA-C 05/09/2017 11:55 AM

## 2017-05-14 ENCOUNTER — Telehealth (HOSPITAL_COMMUNITY): Payer: Self-pay

## 2017-05-14 NOTE — Telephone Encounter (Signed)
Result Notes for HgB A1c   Notes recorded by Chyrl Civatte, RN on 05/14/2017 at 9:25 AM EST Patient aware, started glipizide that was sent in by our office. Patient states he does not have a PCP or endocrinologist. Would like a PCP in or around [facility] that can get him in soon as his glipizide and diabetes needs to be managed fairly quickly, but states he as been unable to find anyone that can get him in within a month. Will forward to Lasandra Beech CHF social worker to see if she can assist patient in establishing care with PCP. ------  Notes recorded by Chyrl Civatte, RN on 05/10/2017 at 3:11 PM EST LVMTCB. ------  Notes recorded by Graciella Freer, PA-C on 05/09/2017 at 11:56 AM EST Hgb A1C markedly elevated. Needs urgent referral to PCP vs Endocrinologist with how high is BG is. May need Jackies help with Medicaid accepting PCP.   Please refill his glipizide for 30 days with no refills.   Casimiro Needle [address] [place], PA-C 05/09/2017 11:55 AM

## 2017-05-15 ENCOUNTER — Telehealth: Payer: Self-pay | Admitting: Licensed Clinical Social Worker

## 2017-05-15 NOTE — Telephone Encounter (Signed)
CSW referred to assist patient with obtaining a PCP. Patient reports he has medicaid and no assigned PCP. Patient requests a provider within the [facility] system. CSW contacted the IM clinic per patient request and obtained an appointment for Monday May 20, 2017 @ 9:15am. Patient grateful for the assistance and will follow up as needed. Lasandra Beech, LCSW, CCSW-MCS [phone]

## 2017-05-15 NOTE — Telephone Encounter (Signed)
Opened in error

## 2017-05-20 ENCOUNTER — Ambulatory Visit (HOSPITAL_COMMUNITY)
Admission: RE | Admit: 2017-05-20 | Discharge: 2017-05-20 | Disposition: A | Discharge status: Home / Self Care | Payer: Medicaid Other | Source: Ambulatory Visit | Attending: Cardiology | Admitting: Cardiology

## 2017-05-20 ENCOUNTER — Ambulatory Visit: Payer: Medicaid Other

## 2017-05-20 DIAGNOSIS — I5022 Chronic systolic (congestive) heart failure: Secondary | ICD-10-CM | POA: Diagnosis not present

## 2017-05-20 LAB — CBC
HCT: 45.4 % (ref 39.0–52.0)
Hemoglobin: 15.5 g/dL (ref 13.0–17.0)
MCH: 29.4 pg (ref 26.0–34.0)
MCHC: 34.1 g/dL (ref 30.0–36.0)
MCV: 86 fL (ref 78.0–100.0)
PLATELETS: 221 10*3/uL (ref 150–400)
RBC: 5.28 MIL/uL (ref 4.22–5.81)
RDW: 13.4 % (ref 11.5–15.5)
WBC: 7 10*3/uL (ref 4.0–10.5)

## 2017-05-20 LAB — BASIC METABOLIC PANEL
ANION GAP: 8 (ref 5–15)
BUN: 14 mg/dL (ref 6–20)
CO2: 27 mmol/L (ref 22–32)
Calcium: 9.4 mg/dL (ref 8.9–10.3)
Chloride: 101 mmol/L (ref 101–111)
Creatinine, Ser: 1.13 mg/dL (ref 0.61–1.24)
GFR calc Af Amer: >60 mL/min (ref >60)
Glucose, Bld: 301 mg/dL — ABNORMAL HIGH (ref 65–99)
POTASSIUM: 3.9 mmol/L (ref 3.5–5.1)
SODIUM: 136 mmol/L (ref 135–145)

## 2017-05-22 ENCOUNTER — Ambulatory Visit: Payer: Medicaid Other | Admitting: Cardiology

## 2017-06-04 ENCOUNTER — Telehealth (HOSPITAL_COMMUNITY): Payer: Self-pay

## 2017-06-04 ENCOUNTER — Other Ambulatory Visit (HOSPITAL_COMMUNITY): Payer: Medicaid Other

## 2017-06-04 NOTE — Telephone Encounter (Signed)
CHF Clinic appointment reminder call placed to patient for upcoming follow up.  LVMTCB to confirm appt   Patient also reminded to take all medications as prescribed on the day of his/her appointment and to bring all medications to this appointment.  Advised to call our office for tardiness or cancellations/rescheduling needs.  .[place], Megan Genevea  

## 2017-07-01 ENCOUNTER — Encounter (HOSPITAL_COMMUNITY): Payer: Medicaid Other | Admitting: Internal Medicine

## 2017-07-31 ENCOUNTER — Inpatient Hospital Stay (HOSPITAL_COMMUNITY): Admission: RE | Admit: 2017-07-31 | Payer: Medicaid Other | Source: Ambulatory Visit | Admitting: Internal Medicine

## 2017-08-23 ENCOUNTER — Encounter (HOSPITAL_COMMUNITY): Payer: Self-pay

## 2017-08-23 ENCOUNTER — Emergency Department (HOSPITAL_COMMUNITY): Payer: Medicaid Other

## 2017-08-23 ENCOUNTER — Other Ambulatory Visit: Payer: Self-pay

## 2017-08-23 ENCOUNTER — Emergency Department (HOSPITAL_COMMUNITY)
Admission: EM | Admit: 2017-08-23 | Discharge: 2017-08-23 | Disposition: A | Discharge status: Home / Self Care | Payer: Medicaid Other | Attending: Emergency Medicine | Admitting: Emergency Medicine

## 2017-08-23 DIAGNOSIS — E119 Type 2 diabetes mellitus without complications: Secondary | ICD-10-CM | POA: Diagnosis not present

## 2017-08-23 DIAGNOSIS — I110 Hypertensive heart disease with heart failure: Secondary | ICD-10-CM | POA: Insufficient documentation

## 2017-08-23 DIAGNOSIS — I5043 Acute on chronic combined systolic (congestive) and diastolic (congestive) heart failure: Secondary | ICD-10-CM | POA: Diagnosis not present

## 2017-08-23 DIAGNOSIS — Z7901 Long term (current) use of anticoagulants: Secondary | ICD-10-CM | POA: Diagnosis not present

## 2017-08-23 DIAGNOSIS — R079 Chest pain, unspecified: Secondary | ICD-10-CM | POA: Diagnosis present

## 2017-08-23 DIAGNOSIS — J189 Pneumonia, unspecified organism: Secondary | ICD-10-CM

## 2017-08-23 DIAGNOSIS — Z79899 Other long term (current) drug therapy: Secondary | ICD-10-CM | POA: Insufficient documentation

## 2017-08-23 DIAGNOSIS — Z7984 Long term (current) use of oral hypoglycemic drugs: Secondary | ICD-10-CM | POA: Diagnosis not present

## 2017-08-23 DIAGNOSIS — I509 Heart failure, unspecified: Secondary | ICD-10-CM

## 2017-08-23 LAB — I-STAT TROPONIN, ED
TROPONIN I, POC: 0.01 ng/mL (ref 0.00–0.08)
Troponin i, poc: 0.03 ng/mL (ref 0.00–0.08)

## 2017-08-23 LAB — CBC
HEMATOCRIT: 41.5 % (ref 39.0–52.0)
Hemoglobin: 13.8 g/dL (ref 13.0–17.0)
MCH: 29.8 pg (ref 26.0–34.0)
MCHC: 33.3 g/dL (ref 30.0–36.0)
MCV: 89.6 fL (ref 78.0–100.0)
Platelets: 211 10*3/uL (ref 150–400)
RBC: 4.63 MIL/uL (ref 4.22–5.81)
RDW: 12.9 % (ref 11.5–15.5)
WBC: 10.5 10*3/uL (ref 4.0–10.5)

## 2017-08-23 LAB — BRAIN NATRIURETIC PEPTIDE: B NATRIURETIC PEPTIDE 5: 1136 pg/mL — AB (ref 0.0–100.0)

## 2017-08-23 LAB — BASIC METABOLIC PANEL
Anion gap: 12 (ref 5–15)
BUN: 11 mg/dL (ref 6–20)
CHLORIDE: 105 mmol/L (ref 101–111)
CO2: 22 mmol/L (ref 22–32)
Calcium: 8.9 mg/dL (ref 8.9–10.3)
Creatinine, Ser: 1.22 mg/dL (ref 0.61–1.24)
GFR calc Af Amer: >60 mL/min (ref >60)
GFR calc non Af Amer: >60 mL/min (ref >60)
Glucose, Bld: 179 mg/dL — ABNORMAL HIGH (ref 65–99)
POTASSIUM: 3.9 mmol/L (ref 3.5–5.1)
SODIUM: 139 mmol/L (ref 135–145)

## 2017-08-23 LAB — DIGOXIN LEVEL

## 2017-08-23 MED ORDER — AZITHROMYCIN 250 MG PO TABS
500.0000 mg | ORAL_TABLET | Freq: Once | ORAL | Status: AC
  Administered 2017-08-23: 500 mg via ORAL
  Filled 2017-08-23: qty 2

## 2017-08-23 MED ORDER — DOXYCYCLINE HYCLATE 100 MG PO CAPS: 100.0000 mg | ORAL_CAPSULE | Freq: Two times a day (BID) | ORAL | 0 refills | Status: DC

## 2017-08-23 MED ORDER — FUROSEMIDE 10 MG/ML IJ SOLN
40.0000 mg | Freq: Once | INTRAMUSCULAR | Status: AC
  Administered 2017-08-23: 40 mg via INTRAVENOUS
  Filled 2017-08-23: qty 4

## 2017-08-23 NOTE — ED Notes (Addendum)
Pt placed on 2Lpm via [place] for comfort.

## 2017-08-23 NOTE — Discharge Instructions (Signed)
Your evaluated in the emergency department for increased shortness of breath and concern for fluid overload.  You did have signs of heart failure and with an extra dose of Lasix had good improvement in your symptoms.  The x-ray was also concerning for possible pneumonia and we are prescribing you antibiotics. please follow-up with your regular doctor and return if any worsening symptoms.

## 2017-08-23 NOTE — ED Triage Notes (Signed)
Per Pt, Pt is coming from home with complaints of Chest pain, SOB, N/V/D that started on Sunday. Reports some fevers. Pt denies being around anyone who has been sick.

## 2017-08-23 NOTE — ED Provider Notes (Signed)
[facility] EMERGENCY DEPARTMENT Provider Note   CSN: [id number] Arrival date & time: 08/23/17  1313     History   Chief Complaint Chief Complaint  Patient presents with  . Chest Pain    HPI Tyrone Walker is a 42 y.o. male.  He has a history of heart failure and mitral valve regurg.  He is complaining of starting 4 days ago nausea and vomiting and diarrhea followed by body aches.  For the past 2 days he has been increasingly short of breath with some chest tightness.  He feels like when he had his congestive heart failure.  He took an extra dose of his furosemide today.  He did not feel any better so he presented to the emergency department.  The history is provided by the patient.  Chest Pain   This is a recurrent problem. The current episode started 2 days ago. The problem occurs constantly. The problem has not changed since onset.The pain is associated with exertion. The pain is present in the substernal region. The pain is moderate. The quality of the pain is described as pressure-like. The pain does not radiate. Associated symptoms include nausea, shortness of breath, sputum production (clear) and vomiting. Pertinent negatives include no abdominal pain, no back pain, no cough, no fever and no palpitations.  Pertinent negatives for past medical history include no seizures.    Past Medical History:  Diagnosis Date  . Acute on chronic systolic (congestive) heart failure ([place]) 02/11/2017  . Chronic systolic heart failure ([place])   . Colitis 10/12/10   ER visit  . Elevated LFTs    likely hepatic congestion from CHF  . history Marijuana smoker   . Hypertension 10/18/2011  . Mitral valve regurgitation: Moderate to severe per 2 d echo 02/13/2017 02/13/2017  . Non-ischemic cardiomyopathy ([place]) 10/20/10   a. cath 11/22/10: normal cors; EF 15-20%;  b. echo 11/19/10: EF 20%, mild MR, mild LAE, PASP 48; c. dx 5/12, tx with IV milrinone for low output, Coxsackie B2 titer elevated;  o/w HIV, CMV, RMSF, ANA, RF, ESR, ACE all normal    Patient Active Problem List   Diagnosis Date Noted  . Acute on chronic diastolic (congestive) heart failure ([place]) 02/13/2017  . Mitral valve regurgitation: Moderate to severe per 2 d echo 02/13/2017 02/13/2017  . Acute on chronic combined systolic and diastolic CHF (congestive heart failure) ([place]) 02/11/2017  . AKI (acute kidney injury) ([place]) 02/11/2017  . Acute bacterial sinusitis 03/06/2012  . Diabetes mellitus with complication ([place]) 50/35/4656  . Molluscum contagiosum 10/18/2011  . Dyshidrotic eczema 10/18/2011  . Hypertension 10/18/2011  . Allergic rhinitis 10/09/2011  . OSA (obstructive sleep apnea) 02/16/2011  . Blurred vision, bilateral 12/05/2010  . Chronic systolic heart failure ([place])   . Nonspecific (abnormal) findings on radiological and other examination of abdominal area, including retroperitoneum 11/17/2010  . Non-ischemic cardiomyopathy ([place]) 10/20/2010    Past Surgical History:  Procedure Laterality Date  . heart catheterization  11/21/10   Dr. Peter Martinique       Home Medications    Prior to Admission medications   Medication Sig Start Date End Date Taking? Authorizing Provider  digoxin (LANOXIN) 0.125 MG tablet Take 1 tablet (0.125 mg total) by mouth daily. 02/18/17  Yes Bensimhon, Shaune Pascal, MD  furosemide (LASIX) 40 MG tablet Take 1 tablet (40 mg total) daily by mouth. 05/09/17  Yes Tillery, Satira Mccallum, PA-C  glipiZIDE (GLUCOTROL) 5 MG tablet Take 1 tablet (5 mg total) 2 (  two) times daily by mouth. 05/10/17 05/10/18 Yes Tillery, Satira Mccallum, PA-C  metoprolol succinate (TOPROL-XL) 100 MG 24 hr tablet Take 1 tablet (100 mg total) by mouth 2 (two) times daily. Take with or immediately following a meal. 02/18/17  Yes Bensimhon, Shaune Pascal, MD  rivaroxaban (XARELTO) 20 MG TABS tablet Take 1 tablet (20 mg total) daily with supper by mouth. 05/09/17  Yes Tillery, Satira Mccallum, PA-C  sacubitril-valsartan  (ENTRESTO) 24-26 MG Take 1 tablet 2 (two) times daily by mouth. 05/09/17  Yes Tillery, Satira Mccallum, PA-C  spironolactone (ALDACTONE) 25 MG tablet Take 1 tablet (25 mg total) by mouth daily. 02/18/17  Yes Bensimhon, Shaune Pascal, MD    Family History Family History  Problem Relation Age of Onset  . Heart disease Mother   . Heart failure Mother   . Rheumatic fever Mother   . Sleep apnea Father   . Diabetes Father   . Hearing loss Paternal Grandfather 27       CHF  . Colon polyps Unknown        Grandfather  . Sleep apnea Unknown   . Heart disease Maternal Grandmother     Social History Social History   Tobacco Use  . Smoking status: Never Smoker  . Smokeless tobacco: Never Used  Substance Use Topics  . Alcohol use: No  . Drug use: Yes    Comment: occasional marijuana     Allergies   Patient has no known allergies.   Review of Systems Review of Systems  Constitutional: Negative for chills and fever.  HENT: Negative for ear pain and sore throat.   Eyes: Negative for pain and visual disturbance.  Respiratory: Positive for sputum production (clear) and shortness of breath. Negative for cough.   Cardiovascular: Positive for chest pain. Negative for palpitations.  Gastrointestinal: Positive for diarrhea, nausea and vomiting. Negative for abdominal pain and blood in stool.  Genitourinary: Negative for dysuria and hematuria.  Musculoskeletal: Positive for myalgias. Negative for arthralgias and back pain.  Skin: Negative for color change and rash.  Neurological: Negative for seizures and syncope.  All other systems reviewed and are negative.    Physical Exam Updated Vital Signs BP (!) 146/105   Pulse (!) 103   Temp 99.7 F (37.6 C) (Oral)   Resp 18   Ht 6' (1.829 m)   Wt 97.1 kg (214 lb)   SpO2 95%   BMI 29.02 kg/m   Physical Exam  Constitutional: He appears well-developed and well-nourished.  HENT:  Head: Normocephalic and atraumatic.  Eyes: Conjunctivae are  normal.  Neck: Neck supple.  Cardiovascular: Regular rhythm and intact distal pulses. Tachycardia present.  Murmur heard. Pulmonary/Chest: Effort normal. No respiratory distress. He has rales in the right lower field and the left lower field.  Abdominal: Soft. There is no tenderness.  Musculoskeletal: He exhibits no edema, tenderness or deformity.  Neurological: He is alert.  Skin: Skin is warm and dry.  Psychiatric: He has a normal mood and affect.  Nursing note and vitals reviewed.    ED Treatments / Results  Labs (all labs ordered are listed, but only abnormal results are displayed) Labs Reviewed  BASIC METABOLIC PANEL - Abnormal; Notable for the following components:      Result Value   Glucose, Bld 179 (*)    All other components within normal limits  CBC  I-STAT TROPONIN, ED    EKG  EKG Interpretation  Date/Time:  Friday August 23 2017 13:21:47 EST Ventricular Rate:  106 PR Interval:  188 QRS Duration: 92 QT Interval:  360 QTC Calculation: 478 R Axis:   92 Text Interpretation:  Sinus tachycardia Biatrial enlargement Rightward axis Pulmonary disease pattern Abnormal ECG Confirmed by Aletta Edouard [phone]) on 08/23/2017 4:36:32 PM       Radiology Dg Chest 2 View  Result Date: 08/23/2017 CLINICAL DATA:  Chest pain and shortness of breath EXAM: CHEST  2 VIEW COMPARISON:  February 12, 2017. FINDINGS: There is airspace consolidation in the superior segment of the right lower lobe. The lungs elsewhere are clear. Heart is upper normal in size with pulmonary vascularity within normal limits. No adenopathy. No bone lesions. IMPRESSION: Airspace consolidation consistent with pneumonia in the superior segment right lower lobe. Lungs elsewhere clear. Heart upper normal in size. Followup PA and lateral chest radiographs recommended in 3-4 weeks following trial of antibiotic therapy to ensure resolution and exclude underlying malignancy. Electronically Signed   By: Lowella Grip III  M.D.   On: 08/23/2017 14:00    Procedures Procedures (including critical care time)  Medications Ordered in ED Medications  azithromycin (ZITHROMAX) tablet 500 mg (not administered)  furosemide (LASIX) injection 40 mg (not administered)     Initial Impression / Assessment and Plan / ED Course  I have reviewed the triage vital signs and the nursing notes.  Pertinent labs & imaging results that were available during my care of the patient were reviewed by me and considered in my medical decision making (see chart for details).  Clinical Course as of Aug 24 1098  Fri Aug 23, 2017  1758 Awaiting a BMP but the patient by chest x-ray was found to have pneumonia.  This likely is the cause of his symptoms although will follow up in the rest of his labs.  He is got Zithromax here and another IV dose some Lasix.  Clinically seems euvolemic.  His sats have been good so he likely will be able to be discharged with outpatient CAP Treatment.  [MB]  2011 Patient had a good diuresis with the Lasix and feels back to normal.  He had a trending pulse ox and did well.  He would like to be discharged.  We will send him out on some antibiotics and he will return if any worsening symptoms.  [MB]    Clinical Course User Index [MB] Hayden Rasmussen, MD      Final Clinical Impressions(s) / ED Diagnoses   Final diagnoses:  Acute on chronic congestive heart failure, unspecified heart failure type [facility])  Community acquired pneumonia, unspecified laterality    ED Discharge Orders        Ordered    doxycycline (VIBRAMYCIN) 100 MG capsule  2 times daily     08/23/17 2020       Hayden Rasmussen, MD 08/24/17 1101

## 2017-08-23 NOTE — ED Notes (Signed)
Patient ambulated while on pulse oximetry, O2 sats remained in the 97-98 range, and HR in the low 100s.

## 2017-08-24 ENCOUNTER — Other Ambulatory Visit (HOSPITAL_COMMUNITY): Payer: Self-pay | Admitting: Student

## 2017-09-02 ENCOUNTER — Other Ambulatory Visit (HOSPITAL_COMMUNITY): Payer: Self-pay | Admitting: Student

## 2017-09-03 ENCOUNTER — Other Ambulatory Visit (HOSPITAL_COMMUNITY): Payer: Self-pay | Admitting: Student

## 2017-09-12 ENCOUNTER — Other Ambulatory Visit (HOSPITAL_COMMUNITY): Payer: Self-pay | Admitting: *Deleted

## 2017-09-12 MED ORDER — FUROSEMIDE 40 MG PO TABS: 40.0000 mg | ORAL_TABLET | Freq: Every day | ORAL | 3 refills | Status: DC

## 2017-09-12 MED ORDER — SACUBITRIL-VALSARTAN 24-26 MG PO TABS: 1.0000 | ORAL_TABLET | Freq: Two times a day (BID) | ORAL | 3 refills | Status: DC

## 2017-09-12 MED ORDER — DIGOXIN 125 MCG PO TABS: 0.1250 mg | ORAL_TABLET | Freq: Every day | ORAL | 3 refills | Status: DC

## 2017-09-22 ENCOUNTER — Other Ambulatory Visit (HOSPITAL_COMMUNITY): Payer: Self-pay | Admitting: Student

## 2017-09-23 ENCOUNTER — Telehealth (HOSPITAL_COMMUNITY): Payer: Self-pay | Admitting: Pharmacist

## 2017-09-23 MED ORDER — GLIPIZIDE 5 MG PO TABS: 5.0000 mg | ORAL_TABLET | Freq: Two times a day (BID) | ORAL | 0 refills | Status: DC

## 2017-09-23 NOTE — Telephone Encounter (Signed)
Tyrone Walker called stating he needs another refill of his glipizide. As has been explained to him in the past, he needs to find a PCP to manage his diabetes since we only manage his HF. He states that when our CSW, Annice Pih, set him up with the IM clinic in November, he was confused and thought it was the Endocrinology Clinic at [facility] so he did not make it to that appointment. I have given him the IM clinic phone number to see if he can reschedule his appointment with them. I will give him 1 month's worth of glipizide but told him that we cannot continue to manage his diabetes after this. He verbalized understanding and was grateful for the assistance.   [name]. Bonnye Fava, PharmD, BCPS, CPP Clinical Pharmacist Phone: [phone] 09/23/2017 3:23 PM

## 2017-09-24 ENCOUNTER — Telehealth: Payer: Self-pay | Admitting: Licensed Clinical Social Worker

## 2017-09-24 NOTE — Telephone Encounter (Signed)
CSW received referral from Pharmacy requesting follow up on PCP. Patient was referred to IM clinic last Fall although appears to have been a no show. CSW contacted patient who states challenge with obtaining appointment at assigned PCP on his Medicaid card. CSW recommended patient contact medicaid worker to clarify PCP assigned and issue new card with correct PCP. Patient verbalizes understanding and will follow up. Lasandra Beech, LCSW, CCSW-MCS [phone]

## 2017-10-18 ENCOUNTER — Encounter (HOSPITAL_COMMUNITY): Payer: Self-pay | Admitting: Internal Medicine

## 2017-10-18 ENCOUNTER — Other Ambulatory Visit: Payer: Self-pay

## 2017-10-18 ENCOUNTER — Ambulatory Visit (HOSPITAL_COMMUNITY)
Admission: RE | Admit: 2017-10-18 | Discharge: 2017-10-18 | Disposition: A | Discharge status: Home / Self Care | Payer: Medicaid Other | Source: Ambulatory Visit | Attending: Internal Medicine | Admitting: Internal Medicine

## 2017-10-18 VITALS — BP 118/86 | HR 82 | Wt 220.5 lb

## 2017-10-18 DIAGNOSIS — I5022 Chronic systolic (congestive) heart failure: Secondary | ICD-10-CM | POA: Diagnosis not present

## 2017-10-18 DIAGNOSIS — I429 Cardiomyopathy, unspecified: Secondary | ICD-10-CM | POA: Diagnosis not present

## 2017-10-18 DIAGNOSIS — Z7984 Long term (current) use of oral hypoglycemic drugs: Secondary | ICD-10-CM | POA: Insufficient documentation

## 2017-10-18 DIAGNOSIS — G4733 Obstructive sleep apnea (adult) (pediatric): Secondary | ICD-10-CM | POA: Insufficient documentation

## 2017-10-18 DIAGNOSIS — I480 Paroxysmal atrial fibrillation: Secondary | ICD-10-CM | POA: Diagnosis present

## 2017-10-18 DIAGNOSIS — Z79899 Other long term (current) drug therapy: Secondary | ICD-10-CM | POA: Diagnosis not present

## 2017-10-18 DIAGNOSIS — Z7901 Long term (current) use of anticoagulants: Secondary | ICD-10-CM | POA: Diagnosis not present

## 2017-10-18 DIAGNOSIS — I110 Hypertensive heart disease with heart failure: Secondary | ICD-10-CM | POA: Diagnosis present

## 2017-10-18 DIAGNOSIS — E1165 Type 2 diabetes mellitus with hyperglycemia: Secondary | ICD-10-CM | POA: Diagnosis not present

## 2017-10-18 DIAGNOSIS — R0602 Shortness of breath: Secondary | ICD-10-CM | POA: Insufficient documentation

## 2017-10-18 DIAGNOSIS — I340 Nonrheumatic mitral (valve) insufficiency: Secondary | ICD-10-CM | POA: Diagnosis not present

## 2017-10-18 DIAGNOSIS — E118 Type 2 diabetes mellitus with unspecified complications: Secondary | ICD-10-CM

## 2017-10-18 LAB — BASIC METABOLIC PANEL
Anion gap: 11 (ref 5–15)
BUN: 10 mg/dL (ref 6–20)
CALCIUM: 8.9 mg/dL (ref 8.9–10.3)
CHLORIDE: 104 mmol/L (ref 101–111)
CO2: 23 mmol/L (ref 22–32)
CREATININE: 1.07 mg/dL (ref 0.61–1.24)
GFR calc Af Amer: >60 mL/min (ref >60)
GFR calc non Af Amer: >60 mL/min (ref >60)
Glucose, Bld: 273 mg/dL — ABNORMAL HIGH (ref 65–99)
Potassium: 4.2 mmol/L (ref 3.5–5.1)
SODIUM: 138 mmol/L (ref 135–145)

## 2017-10-18 LAB — HEMOGLOBIN A1C
Hgb A1c MFr Bld: 10.8 % — ABNORMAL HIGH (ref 4.8–5.6)
Mean Plasma Glucose: 263.26 mg/dL

## 2017-10-18 LAB — BRAIN NATRIURETIC PEPTIDE: B Natriuretic Peptide: 341.1 pg/mL — ABNORMAL HIGH (ref 0.0–100.0)

## 2017-10-18 MED ORDER — EMPAGLIFLOZIN 10 MG PO TABS: 10.0000 mg | ORAL_TABLET | Freq: Every day | ORAL | 6 refills | Status: DC

## 2017-10-18 NOTE — Progress Notes (Signed)
Patient ID: Tyrone Walker, male   DOB: 12/19/1975, 42 y.o.   MRN: [id number]     Advanced Heart Failure Clinic Note   History of Present Illness: Primary HF Cardiologist:  Dr. Glori Bickers PCP:   Zachrey Deutscher is a 42 y.o. 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 20%.   Cath 11/2010 showed normal cors.  He had serology done to evaluate etiology of his myopathy.  All titers were negative except for Coxsackie B2 titer was elevated.   Admitted 8/20 through 02/14/2017 with increased SOB. Diuresed with IV lasix. ECHO with reduced EF 20-25%. Discharge weight 214 pounds.   He presents today for regular follow up. Last visit Entresto restarted. Glipizide restarted Hgba1c 11%. Feels great. Able to do anything he wants. No CP, orthopnea or PND. Takes lasix 40 daily. Occasionally will take a second dose when fluid overloaded. SBP typically 100-110 but often in 90s. Weight up and down   CPX 02/05/11: pVO2 23.6 (71%)  Ve/VCO2 27 O2 pulse 80% RER 1.25 VE/MVV 56%  06/2011: LVEF 35-40%, diffuse hypokinesis.  Grade 1 diastolic dysfunction.   01/24/12: LVEF 66-44%, grade 1 diastolic dysfunction.  11/13: ECHO EF 50-55%  03/20/13: EF 30%  01/2017: EF 20-25%.   Past Medical History:  Diagnosis Date  . Acute on chronic systolic (congestive) heart failure ([place]) 02/11/2017  . Chronic systolic heart failure ([place])   . Colitis 10/12/10   ER visit  . Elevated LFTs    likely hepatic congestion from CHF  . history Marijuana smoker   . Hypertension 10/18/2011  . Mitral valve regurgitation: Moderate to severe per 2 d echo 02/13/2017 02/13/2017  . Non-ischemic cardiomyopathy ([place]) 10/20/10   a. cath 11/22/10: normal cors; EF 15-20%;  b. echo 11/19/10: EF 20%, mild MR, mild LAE, PASP 48; c. dx 5/12, tx with IV milrinone for low output, Coxsackie B2 titer elevated; o/w HIV, CMV, RMSF, ANA, RF, ESR, ACE all normal    Current Outpatient Medications  Medication Sig Dispense Refill  .  digoxin (LANOXIN) 0.125 MG tablet Take 1 tablet (0.125 mg total) by mouth daily. 30 tablet 3  . furosemide (LASIX) 40 MG tablet Take 1 tablet (40 mg total) by mouth daily. 90 tablet 3  . glipiZIDE (GLUCOTROL) 5 MG tablet Take 1 tablet (5 mg total) by mouth 2 (two) times daily. 60 tablet 0  . metoprolol succinate (TOPROL-XL) 100 MG 24 hr tablet Take 1 tablet (100 mg total) by mouth 2 (two) times daily. Take with or immediately following a meal. 60 tablet 3  . rivaroxaban (XARELTO) 20 MG TABS tablet Take 1 tablet (20 mg total) daily with supper by mouth. 90 tablet 3  . sacubitril-valsartan (ENTRESTO) 24-26 MG Take 1 tablet by mouth 2 (two) times daily. 60 tablet 3  . spironolactone (ALDACTONE) 25 MG tablet Take 1 tablet (25 mg total) by mouth daily. 30 tablet 3   No current facility-administered medications for this encounter.     Allergies: No Known Allergies  Review of systems complete and found to be negative unless listed in HPI.    Vital Signs: Vitals:   10/18/17 1020  BP: 118/86  Pulse: 82  SpO2: 100%  Weight: 220 lb 8 oz (100 kg)   Wt Readings from Last 3 Encounters:  10/18/17 220 lb 8 oz (100 kg)  08/23/17 214 lb (97.1 kg)  05/09/17 224 lb (101.6 kg)    PHYSICAL EXAM: General:  Well appearing. Muscular No  resp difficulty HEENT: normal Neck: supple. no JVD. Carotids 2+ bilat; no bruits. No lymphadenopathy or thryomegaly appreciated. Cor: PMI nondisplaced. Regular rate & rhythm. No rubs, gallops or murmurs. Lungs: clear Abdomen: soft, nontender, nondistended. No hepatosplenomegaly. No bruits or masses. Good bowel sounds. Extremities: no cyanosis, clubbing, rash, edema Neuro: alert & orientedx3, cranial nerves grossly intact. moves all 4 extremities w/o difficulty. Affect pleasant  ASSESSMENT AND PLAN: 1. Chronic systolic HF due to NICM - 01/2017 EF 20-25%.   - Stable NYHA I-II symptoms - Volume status looks good - Continue Entresto 24/26 mg BID. BP too soft to  titrate - Continue lasix 40 mg daily. With addition of Jardiance told him he can cut back if no edema.  - Continue Toprol 100 mg BID - Continue spironolactone 25 mg daily - Continue digoxin 0.125 mg daily. - Check echo. If EF not > 35% will need to consider ICD - Reinforced fluid restriction to < 2 L daily, sodium restriction to less than 2000 mg daily, and the importance of daily weights.   2. HTN -Blood pressure well controlled. Continue current regimen. 3. OSA -Has not had CPAP since 2014. Has been referred to Dr. Radford Pax for sleep study but has not gone. Reminded him of need to follow-ip 4. PAF - Maintaining nSR - Continue Xarelto 20 mg daily. 5. Uncontrolled DM - hgb A1C 11.5 01/2017. Repeat today is 10.8%. - Start Jardiance 10. Refer to Dr. Vashti Hey, MD  9:00 PM

## 2017-10-18 NOTE — Patient Instructions (Signed)
Start Jardiance 10 mg daily  Your physician has requested that you have a cardiac MRI. Cardiac MRI uses a computer to create images of your heart as its beating, producing both still and moving pictures of your heart and major blood vessels. For further information please visit [email or web address]. Please follow the instruction sheet given to you today for more information.  You have been referred to Dr Lafe Garin, for your diabetes, her office will call you to schedule  Your physician recommends that you schedule a follow-up appointment in: 4 weeks

## 2017-10-21 ENCOUNTER — Encounter: Payer: Self-pay | Admitting: Internal Medicine

## 2017-10-24 ENCOUNTER — Telehealth (HOSPITAL_COMMUNITY): Payer: Self-pay | Admitting: Pharmacist

## 2017-10-24 NOTE — Telephone Encounter (Signed)
Jardiance PA approved by [place] Medicaid through 10/18/18.   [name]. Bonnye Fava, PharmD, BCPS, CPP Clinical Pharmacist Phone: [phone] 10/24/2017 2:30 PM

## 2017-11-04 ENCOUNTER — Ambulatory Visit (HOSPITAL_COMMUNITY): Admission: RE | Admit: 2017-11-04 | Payer: Medicaid Other | Source: Ambulatory Visit

## 2017-11-11 ENCOUNTER — Other Ambulatory Visit (HOSPITAL_COMMUNITY): Payer: Self-pay | Admitting: *Deleted

## 2017-11-11 MED ORDER — SPIRONOLACTONE 25 MG PO TABS: 25.0000 mg | ORAL_TABLET | Freq: Every day | ORAL | 3 refills | Status: DC

## 2017-11-11 MED ORDER — METOPROLOL SUCCINATE ER 100 MG PO TB24: 100.0000 mg | ORAL_TABLET | Freq: Two times a day (BID) | ORAL | 3 refills | Status: DC

## 2017-11-15 ENCOUNTER — Encounter (HOSPITAL_COMMUNITY): Payer: Self-pay

## 2017-11-15 ENCOUNTER — Ambulatory Visit (HOSPITAL_COMMUNITY)
Admission: RE | Admit: 2017-11-15 | Discharge: 2017-11-15 | Disposition: A | Discharge status: Home / Self Care | Payer: Medicaid Other | Source: Ambulatory Visit | Attending: Internal Medicine | Admitting: Internal Medicine

## 2017-11-15 VITALS — BP 140/86 | HR 88 | Wt 216.0 lb

## 2017-11-15 DIAGNOSIS — I428 Other cardiomyopathies: Secondary | ICD-10-CM | POA: Insufficient documentation

## 2017-11-15 DIAGNOSIS — Z79899 Other long term (current) drug therapy: Secondary | ICD-10-CM | POA: Diagnosis not present

## 2017-11-15 DIAGNOSIS — I480 Paroxysmal atrial fibrillation: Secondary | ICD-10-CM

## 2017-11-15 DIAGNOSIS — I5022 Chronic systolic (congestive) heart failure: Secondary | ICD-10-CM

## 2017-11-15 DIAGNOSIS — G4733 Obstructive sleep apnea (adult) (pediatric): Secondary | ICD-10-CM

## 2017-11-15 DIAGNOSIS — Z7901 Long term (current) use of anticoagulants: Secondary | ICD-10-CM | POA: Insufficient documentation

## 2017-11-15 DIAGNOSIS — E118 Type 2 diabetes mellitus with unspecified complications: Secondary | ICD-10-CM | POA: Diagnosis not present

## 2017-11-15 DIAGNOSIS — E1165 Type 2 diabetes mellitus with hyperglycemia: Secondary | ICD-10-CM | POA: Diagnosis not present

## 2017-11-15 DIAGNOSIS — I10 Essential (primary) hypertension: Secondary | ICD-10-CM | POA: Diagnosis not present

## 2017-11-15 DIAGNOSIS — I110 Hypertensive heart disease with heart failure: Secondary | ICD-10-CM | POA: Diagnosis not present

## 2017-11-15 DIAGNOSIS — Z7984 Long term (current) use of oral hypoglycemic drugs: Secondary | ICD-10-CM | POA: Insufficient documentation

## 2017-11-15 LAB — BASIC METABOLIC PANEL
Anion gap: 9 (ref 5–15)
BUN: 13 mg/dL (ref 6–20)
CHLORIDE: 104 mmol/L (ref 101–111)
CO2: 27 mmol/L (ref 22–32)
CREATININE: 1.24 mg/dL (ref 0.61–1.24)
Calcium: 9.4 mg/dL (ref 8.9–10.3)
GFR calc non Af Amer: >60 mL/min (ref >60)
Glucose, Bld: 184 mg/dL — ABNORMAL HIGH (ref 65–99)
POTASSIUM: 3.9 mmol/L (ref 3.5–5.1)
Sodium: 140 mmol/L (ref 135–145)

## 2017-11-15 NOTE — Patient Instructions (Signed)
Will refer you to Endocrinology with Dr. Foye Spurling for Diabetes.  Will refer you to Dr. Armanda Magic for sleep apnea.  Both offices will call you to schedule.  Routine lab work today. Will notify you of abnormal results, otherwise no news is good news!  Follow up 4 weeks with Dr. Gala Romney and echocardiogram.  _______________________________________________________ Tyrone Walker Code: 1300  Take all medication as prescribed the day of your appointment. Bring all medications with you to your appointment.  Do the following things EVERYDAY: 1) Weigh yourself in the morning before breakfast. Write it down and keep it in a log. 2) Take your medicines as prescribed 3) Eat low salt foods-Limit salt (sodium) to 2000 mg per day.  4) Stay as active as you can everyday 5) Limit all fluids for the day to less than 2 liters

## 2017-11-15 NOTE — Progress Notes (Signed)
Patient ID: Tyrone Walker, male   DOB: [birth year], 42 y.o.   MRN: [id number]     Advanced Heart Failure Clinic Note  Primary HF Cardiologist:  Dr. Glori Bickers PCP: None   History of Present Illness: Tyrone Walker is a 42 y.o. 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 20%.   Cath 11/2010 showed normal cors.  He had serology done to evaluate etiology of his myopathy.  All titers were negative except for Coxsackie B2 titer was elevated.   Admitted 8/20 through 02/14/2017 with increased SOB. Diuresed with IV lasix. ECHO with reduced EF 20-25%. Discharge weight 214 pounds.   He returns for HF follow up. Last visit jardiance was started however he never started because it was not approved. He forgot his appointment with Dr Renne Crigler. Overall feeling fine. Denies SOB/PND/Orthopnea. Appetite ok. No fever or chills. Weight at home has been stable. Taking all medications but did not take them this morning.   CPX 02/05/11: pVO2 23.6 (71%)  Ve/VCO2 27 O2 pulse 80% RER 1.25 VE/MVV 56%  06/2011: LVEF 35-40%, diffuse hypokinesis.  Grade 1 diastolic dysfunction.   01/24/12: LVEF 65-03%, grade 1 diastolic dysfunction.  11/13: ECHO EF 50-55%  03/20/13: EF 30%  01/2017: EF 20-25%.   Past Medical History:  Diagnosis Date  . Acute on chronic systolic (congestive) heart failure ([place]) 02/11/2017  . Chronic systolic heart failure ([place])   . Colitis 10/12/10   ER visit  . Elevated LFTs    likely hepatic congestion from CHF  . history Marijuana smoker   . Hypertension 10/18/2011  . Mitral valve regurgitation: Moderate to severe per 2 d echo 02/13/2017 02/13/2017  . Non-ischemic cardiomyopathy ([place]) 10/20/10   a. cath 11/22/10: normal cors; EF 15-20%;  b. echo 11/19/10: EF 20%, mild MR, mild LAE, PASP 48; c. dx 5/12, tx with IV milrinone for low output, Coxsackie B2 titer elevated; o/w HIV, CMV, RMSF, ANA, RF, ESR, ACE all normal    Current Outpatient Medications  Medication Sig  Dispense Refill  . digoxin (LANOXIN) 0.125 MG tablet Take 1 tablet (0.125 mg total) by mouth daily. 30 tablet 3  . furosemide (LASIX) 40 MG tablet Take 1 tablet (40 mg total) by mouth daily. 90 tablet 3  . glipiZIDE (GLUCOTROL) 5 MG tablet Take 1 tablet (5 mg total) by mouth 2 (two) times daily. 60 tablet 0  . metoprolol succinate (TOPROL-XL) 100 MG 24 hr tablet Take 1 tablet (100 mg total) by mouth 2 (two) times daily. Take with or immediately following a meal. 60 tablet 3  . rivaroxaban (XARELTO) 20 MG TABS tablet Take 1 tablet (20 mg total) daily with supper by mouth. 90 tablet 3  . sacubitril-valsartan (ENTRESTO) 24-26 MG Take 1 tablet by mouth 2 (two) times daily. 60 tablet 3  . spironolactone (ALDACTONE) 25 MG tablet Take 1 tablet (25 mg total) by mouth daily. 30 tablet 3   No current facility-administered medications for this encounter.     Allergies: No Known Allergies  Review of systems complete and found to be negative unless listed in HPI.    Vital Signs: Vitals:   11/15/17 0913  BP: 140/86  Pulse: 88  SpO2: 99%  Weight: 216 lb (98 kg)   Wt Readings from Last 3 Encounters:  11/15/17 216 lb (98 kg)  10/18/17 220 lb 8 oz (100 kg)  08/23/17 214 lb (97.1 kg)    PHYSICAL EXAM: General:  Well appearing. No resp  difficulty HEENT: normal Neck: supple. no JVD. Carotids 2+ bilat; no bruits. No lymphadenopathy or thryomegaly appreciated. Cor: PMI nondisplaced. Regular rate & rhythm. No rubs, gallops or murmurs. Lungs: clear Abdomen: soft, nontender, nondistended. No hepatosplenomegaly. No bruits or masses. Good bowel sounds. Extremities: no cyanosis, clubbing, rash, edema Neuro: alert & orientedx3, cranial nerves grossly intact. moves all 4 extremities w/o difficulty. Affect pleasant  ASSESSMENT AND PLAN: 1. Chronic systolic HF due to NICM - 01/2017 EF 20-25%.   - NYHA II. Volume status stable. Continue lasix 40 mg dail.  - Continue Entresto 24/26 mg BID. - Continue Toprol  100 mg BID - Continue spironolactone 25 mg daily - Continue digoxin 0.125 mg daily. -Repeat ECHO in 8 weeks.  If EF not > 35% will need to consider ICD 2. HTN -Stable at home. Refer back to Dr Radford Pax for OSA.  3. OSA -Has not had CPAP since 2014.  - Refer back to Dr Radford Pax.  4. PAF - Maintaining nSR - Continue Xarelto 20 mg daily. 5. Uncontrolled DM - Repeat today is 10.8%. - He was supposed to start jardiance but he never started because it was not approved by insurance.  Refer to Dr. Monna Fam  Follow up in 4 weeks and 8 week with ECHO. BMET today.   Darrick Grinder, NP  9:22 AM

## 2017-11-22 ENCOUNTER — Encounter (HOSPITAL_COMMUNITY): Payer: Self-pay | Admitting: *Deleted

## 2017-12-12 ENCOUNTER — Ambulatory Visit (HOSPITAL_BASED_OUTPATIENT_CLINIC_OR_DEPARTMENT_OTHER)
Admission: RE | Admit: 2017-12-12 | Discharge: 2017-12-12 | Disposition: A | Discharge status: Home / Self Care | Payer: Medicaid Other | Source: Ambulatory Visit | Attending: Internal Medicine | Admitting: Internal Medicine

## 2017-12-12 ENCOUNTER — Ambulatory Visit (HOSPITAL_COMMUNITY)
Admission: RE | Admit: 2017-12-12 | Discharge: 2017-12-12 | Disposition: A | Discharge status: Home / Self Care | Payer: Medicaid Other | Source: Ambulatory Visit | Attending: Internal Medicine | Admitting: Internal Medicine

## 2017-12-12 VITALS — BP 154/86 | HR 95 | Wt 217.8 lb

## 2017-12-12 DIAGNOSIS — I10 Essential (primary) hypertension: Secondary | ICD-10-CM | POA: Diagnosis not present

## 2017-12-12 DIAGNOSIS — R0602 Shortness of breath: Secondary | ICD-10-CM | POA: Insufficient documentation

## 2017-12-12 DIAGNOSIS — Z7984 Long term (current) use of oral hypoglycemic drugs: Secondary | ICD-10-CM | POA: Diagnosis not present

## 2017-12-12 DIAGNOSIS — G4733 Obstructive sleep apnea (adult) (pediatric): Secondary | ICD-10-CM

## 2017-12-12 DIAGNOSIS — Z79899 Other long term (current) drug therapy: Secondary | ICD-10-CM | POA: Diagnosis not present

## 2017-12-12 DIAGNOSIS — I480 Paroxysmal atrial fibrillation: Secondary | ICD-10-CM | POA: Diagnosis not present

## 2017-12-12 DIAGNOSIS — I5022 Chronic systolic (congestive) heart failure: Secondary | ICD-10-CM | POA: Insufficient documentation

## 2017-12-12 DIAGNOSIS — E1165 Type 2 diabetes mellitus with hyperglycemia: Secondary | ICD-10-CM | POA: Diagnosis not present

## 2017-12-12 DIAGNOSIS — Z7901 Long term (current) use of anticoagulants: Secondary | ICD-10-CM | POA: Insufficient documentation

## 2017-12-12 DIAGNOSIS — E118 Type 2 diabetes mellitus with unspecified complications: Secondary | ICD-10-CM

## 2017-12-12 DIAGNOSIS — I429 Cardiomyopathy, unspecified: Secondary | ICD-10-CM | POA: Insufficient documentation

## 2017-12-12 DIAGNOSIS — I340 Nonrheumatic mitral (valve) insufficiency: Secondary | ICD-10-CM | POA: Diagnosis not present

## 2017-12-12 DIAGNOSIS — I110 Hypertensive heart disease with heart failure: Secondary | ICD-10-CM | POA: Diagnosis not present

## 2017-12-12 LAB — CBC
HEMATOCRIT: 45.5 % (ref 39.0–52.0)
HEMOGLOBIN: 14.9 g/dL (ref 13.0–17.0)
MCH: 28.7 pg (ref 26.0–34.0)
MCHC: 32.7 g/dL (ref 30.0–36.0)
MCV: 87.7 fL (ref 78.0–100.0)
Platelets: 202 10*3/uL (ref 150–400)
RBC: 5.19 MIL/uL (ref 4.22–5.81)
RDW: 12.8 % (ref 11.5–15.5)
WBC: 7.3 10*3/uL (ref 4.0–10.5)

## 2017-12-12 LAB — BASIC METABOLIC PANEL
ANION GAP: 9 (ref 5–15)
BUN: 10 mg/dL (ref 6–20)
CALCIUM: 9.3 mg/dL (ref 8.9–10.3)
CO2: 27 mmol/L (ref 22–32)
Chloride: 104 mmol/L (ref 101–111)
Creatinine, Ser: 1.19 mg/dL (ref 0.61–1.24)
GFR calc Af Amer: >60 mL/min (ref >60)
Glucose, Bld: 164 mg/dL — ABNORMAL HIGH (ref 65–99)
Potassium: 3.6 mmol/L (ref 3.5–5.1)
Sodium: 140 mmol/L (ref 135–145)

## 2017-12-12 LAB — HEMOGLOBIN A1C
HEMOGLOBIN A1C: 10.8 % — AB (ref 4.8–5.6)
Mean Plasma Glucose: 263.26 mg/dL

## 2017-12-12 LAB — BRAIN NATRIURETIC PEPTIDE: B Natriuretic Peptide: 275.9 pg/mL — ABNORMAL HIGH (ref 0.0–100.0)

## 2017-12-12 MED ORDER — SACUBITRIL-VALSARTAN 49-51 MG PO TABS: 1.0000 | ORAL_TABLET | Freq: Two times a day (BID) | ORAL | 3 refills | Status: DC

## 2017-12-12 NOTE — Addendum Note (Signed)
Encounter addended by: Noralee Space, RN on: 12/12/2017 4:04 PM  Actions taken: Visit diagnoses modified, Order list changed, Diagnosis association updated, Sign clinical note

## 2017-12-12 NOTE — Patient Instructions (Addendum)
Increase Entrsto to 49/51 mg Twice daily   Labs today  Your physician has recommended that you have a cardiopulmonary stress test (CPX). CPX testing is a non-invasive measurement of heart and lung function. It replaces a traditional treadmill stress test. This type of test provides a tremendous amount of information that relates not only to your present condition but also for future outcomes. This test combines measurements of you ventilation, respiratory gas exchange in the lungs, electrocardiogram (EKG), blood pressure and physical response before, during, and following an exercise protocol.  Your physician has requested that you have a cardiac MRI. Cardiac MRI uses a computer to create images of your heart as its beating, producing both still and moving pictures of your heart and major blood vessels. For further information please visit [email or web address]. Please follow the instruction sheet given to you today for more information.  You have been referred to EP for ICD  Your physician recommends that you schedule a follow-up appointment in: 3 months

## 2017-12-12 NOTE — Progress Notes (Signed)
Patient ID: Tyrone Walker, male   DOB: 10/03/1975, 42 y.o.   MRN: [id number]     Advanced Heart Failure Clinic Note  Primary HF Cardiologist:  Dr. Glori Bickers PCP: None   History of Present Illness: Tyrone Walker is a 42 y.o. 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 20%.   Cath 11/2010 showed normal cors.  He had serology done to evaluate etiology of his myopathy.  All titers were negative except for Coxsackie B2 titer was elevated.   Admitted 8/20 through 02/14/2017 with increased SOB. Diuresed with IV lasix. ECHO with reduced EF 20-25%. Discharge weight 214 pounds.   He returns for HF follow up. Feeling great. Rode his bike for almost 2 hours around [employer]. No CP or SOB. No orthopnea or PND. Taking all meds except Jardiance. Awaiting to start his CPAP    Echo today EF 10-15% RV ok    CPX 02/05/11: pVO2 23.6 (71%)  Ve/VCO2 27 O2 pulse 80% RER 1.25 VE/MVV 56%  06/2011: LVEF 35-40%, diffuse hypokinesis.  Grade 1 diastolic dysfunction.   01/24/12: LVEF 40-98%, grade 1 diastolic dysfunction.  11/13: ECHO EF 50-55%  03/20/13: EF 30%  01/2017: EF 20-25%.   Past Medical History:  Diagnosis Date  . Acute on chronic systolic (congestive) heart failure ([place]) 02/11/2017  . Chronic systolic heart failure ([place])   . Colitis 10/12/10   ER visit  . Elevated LFTs    likely hepatic congestion from CHF  . history Marijuana smoker   . Hypertension 10/18/2011  . Mitral valve regurgitation: Moderate to severe per 2 d echo 02/13/2017 02/13/2017  . Non-ischemic cardiomyopathy ([place]) 10/20/10   a. cath 11/22/10: normal cors; EF 15-20%;  b. echo 11/19/10: EF 20%, mild MR, mild LAE, PASP 48; c. dx 5/12, tx with IV milrinone for low output, Coxsackie B2 titer elevated; o/w HIV, CMV, RMSF, ANA, RF, ESR, ACE all normal    Current Outpatient Medications  Medication Sig Dispense Refill  . digoxin (LANOXIN) 0.125 MG tablet Take 1 tablet (0.125 mg total) by mouth  daily. 30 tablet 3  . furosemide (LASIX) 40 MG tablet Take 1 tablet (40 mg total) by mouth daily. 90 tablet 3  . glipiZIDE (GLUCOTROL) 5 MG tablet Take 1 tablet (5 mg total) by mouth 2 (two) times daily. 60 tablet 0  . metoprolol succinate (TOPROL-XL) 100 MG 24 hr tablet Take 1 tablet (100 mg total) by mouth 2 (two) times daily. Take with or immediately following a meal. 60 tablet 3  . rivaroxaban (XARELTO) 20 MG TABS tablet Take 1 tablet (20 mg total) daily with supper by mouth. 90 tablet 3  . sacubitril-valsartan (ENTRESTO) 24-26 MG Take 1 tablet by mouth 2 (two) times daily. 60 tablet 3  . spironolactone (ALDACTONE) 25 MG tablet Take 1 tablet (25 mg total) by mouth daily. 30 tablet 3   No current facility-administered medications for this encounter.     Allergies: No Known Allergies  Review of systems complete and found to be negative unless listed in HPI.    Vital Signs: Vitals:   12/12/17 1525  BP: (!) 154/86  Pulse: 95  SpO2: 96%  Weight: 217 lb 12.8 oz (98.8 kg)   Wt Readings from Last 3 Encounters:  12/12/17 217 lb 12.8 oz (98.8 kg)  11/15/17 216 lb (98 kg)  10/18/17 220 lb 8 oz (100 kg)    PHYSICAL EXAM: General:  Well appearing. No resp difficulty HEENT: normal  Neck: supple. no JVD. Carotids 2+ bilat; no bruits. No lymphadenopathy or thryomegaly appreciated. Cor: PMI laterally displaced. Regular rate & rhythm. No rubs, gallops or murmurs. Lungs: clear Abdomen: soft, nontender, nondistended. No hepatosplenomegaly. No bruits or masses. Good bowel sounds. Extremities: no cyanosis, clubbing, rash, edema Neuro: alert & orientedx3, cranial nerves grossly intact. moves all 4 extremities w/o difficulty. Affect pleasant   ASSESSMENT AND PLAN: 1. Chronic systolic HF due to NICM - 01/2017 EF 20-25%.   - Echo today EF 10-15% - NYHA I-II despite severe LV dysfunction. Volume status stable. Continue lasix 40 mg daily.  - Increase Entresto 49/51 mg BID. - Continue Toprol 100  mg BID - Continue spironolactone 25 mg daily - Continue digoxin 0.125 mg daily. - Will proceed with CPX and cMRI - Refer for ICD 2. HTN - Elevated here. At home SBP 115-130.  Increasing Entresto.  3. OSA -Has not had CPAP since 2014.  - has f/u scheduled with Dr. Radford Pax  4. PAF - Maintaining nSR - Continue Xarelto 20 mg daily. No blleding.  5. Uncontrolled DM - Has cut back sodas and improved. Recent HgB A1c 10.8%. Awaiting recheck.  - He was supposed to start jardiance but he never started because it was not approved by insurance.  Waiting to see Dr. Vashti Hey, MD  3:29 PM

## 2017-12-12 NOTE — Progress Notes (Signed)
  Echocardiogram 2D Echocardiogram has been performed.  Janalyn Harder 12/12/2017, 3:16 PM

## 2017-12-18 ENCOUNTER — Other Ambulatory Visit (HOSPITAL_COMMUNITY): Payer: Self-pay | Admitting: Internal Medicine

## 2017-12-19 ENCOUNTER — Encounter: Payer: Self-pay | Admitting: Internal Medicine

## 2017-12-19 ENCOUNTER — Telehealth: Payer: Self-pay | Admitting: Internal Medicine

## 2017-12-19 NOTE — Telephone Encounter (Signed)
Called patient and LVM to call back with time of day he would like his cardiac MRI.

## 2017-12-25 ENCOUNTER — Ambulatory Visit (INDEPENDENT_AMBULATORY_CARE_PROVIDER_SITE_OTHER): Payer: Medicaid Other | Admitting: Internal Medicine

## 2017-12-25 ENCOUNTER — Encounter: Payer: Self-pay | Admitting: Internal Medicine

## 2017-12-25 VITALS — BP 98/64 | HR 98 | Ht 72.0 in | Wt 219.0 lb

## 2017-12-25 DIAGNOSIS — I428 Other cardiomyopathies: Secondary | ICD-10-CM

## 2017-12-25 DIAGNOSIS — I5022 Chronic systolic (congestive) heart failure: Secondary | ICD-10-CM | POA: Diagnosis not present

## 2017-12-25 NOTE — Patient Instructions (Addendum)
Medication Instructions:  Your physician recommends that you continue on your current medications as directed. Please refer to the Current Medication list given to you today.  Labwork: None ordered.    Testing/Procedures: Your physician has recommended that you have a defibrillator inserted. An implantable cardioverter defibrillator (ICD) is a small device that is placed in your chest or, in rare cases, your abdomen. This device uses electrical pulses or shocks to help control life-threatening, irregular heartbeats that could lead the heart to suddenly stop beating (sudden cardiac arrest). Leads are attached to the ICD that goes into your heart. This is done in the hospital and usually requires an overnight stay. Please see the instruction sheet given to you today for more information.  Follow-Up:  The following days are available for procedures:  July 8, 10, 11, 15, 16, 18, 29 August 13, 15, 20, 22, 28, 29 If you decide on a day please give me a call:  Dierdre Highman [phone]    Cardioverter Defibrillator Implantation An implantable cardioverter defibrillator (ICD) is a small device that is placed under the skin in the chest or abdomen. An ICD consists of a battery, a small computer (pulse generator), and wires (leads) that go into the heart. An ICD is used to detect and correct two types of dangerous irregular heartbeats (arrhythmias):  A rapid heart rhythm (tachycardia).  An arrhythmia in which the lower chambers of the heart (ventricles) contract in an uncoordinated way (fibrillation).  When an ICD detects tachycardia, it sends a low-energy shock to the heart to restore the heartbeat to normal (cardioversion). This signal is usually painless. If cardioversion does not work or if the ICD detects fibrillation, it delivers a high-energy shock to the heart (defibrillation) to restart the heart. This shock may feel like a strong jolt in the chest. Your health care provider may prescribe an  ICD if:  You have had an arrhythmia that originated in the ventricles.  Your heart has been damaged by a disease or heart condition.  Sometimes, ICDs are programmed to act as a device called a pacemaker. Pacemakers can be used to treat a slow heartbeat (bradycardia) or tachycardia by taking over the heart rate with electrical impulses. Tell a health care provider about:  Any allergies you have.  All medicines you are taking, including vitamins, herbs, eye drops, creams, and over-the-counter medicines.  Any problems you or family members have had with anesthetic medicines.  Any blood disorders you have.  Any surgeries you have had.  Any medical conditions you have.  Whether you are pregnant or may be pregnant. What are the risks? Generally, this is a safe procedure. However, problems may occur, including:  Swelling, bleeding, or bruising.  Infection.  Blood clots.  Damage to other structures or organs, such as nerves, blood vessels, or the heart.  Allergic reactions to medicines used during the procedure.  What happens before the procedure? Staying hydrated Follow instructions from your health care provider about hydration, which may include:  Up to 2 hours before the procedure - you may continue to drink clear liquids, such as water, clear fruit juice, black coffee, and plain tea.  Eating and drinking restrictions Follow instructions from your health care provider about eating and drinking, which may include:  8 hours before the procedure - stop eating heavy meals or foods such as meat, fried foods, or fatty foods.  6 hours before the procedure - stop eating light meals or foods, such as toast or cereal.  6 hours  before the procedure - stop drinking milk or drinks that contain milk.  2 hours before the procedure - stop drinking clear liquids.  Medicine Ask your health care provider about:  Changing or stopping your normal medicines. This is important if you take  diabetes medicines or blood thinners.  Taking medicines such as aspirin and ibuprofen. These medicines can thin your blood. Do not take these medicines before your procedure if your doctor tells you not to.  Tests  You may have blood tests.  You may have a test to check the electrical signals in your heart (electrocardiogram, ECG).  You may have imaging tests, such as a chest X-ray. General instructions  For 24 hours before the procedure, stop using products that contain nicotine or tobacco, such as cigarettes and e-cigarettes. If you need help quitting, ask your health care provider.  Plan to have someone take you home from the hospital or clinic.  You may be asked to shower with a germ-killing soap. What happens during the procedure?  To reduce your risk of infection: ? Your health care team will wash or sanitize their hands. ? Your skin will be washed with soap. ? Hair may be removed from the surgical area.  Small monitors will be put on your body. They will be used to check your heart, blood pressure, and oxygen level.  An IV tube will be inserted into one of your veins.  You will be given one or more of the following: ? A medicine to help you relax (sedative). ? A medicine to numb the area (local anesthetic). ? A medicine to make you fall asleep (general anesthetic).  Leads will be guided through a blood vessel into your heart and attached to your heart muscles. Depending on the ICD, the leads may go into one ventricle or they may go into both ventricles and into an upper chamber of the heart. An X-ray machine (fluoroscope) will be usedto help guide the leads.  A small incision will be made to create a deep pocket under your skin.  The pulse generator will be placed into the pocket.  The ICD will be tested.  The incision will be closed with stitches (sutures), skin glue, or staples.  A bandage (dressing) will be placed over the incision. This procedure may vary among  health care providers and hospitals. What happens after the procedure?  Your blood pressure, heart rate, breathing rate, and blood oxygen level will be monitored often until the medicines you were given have worn off.  A chest X-ray will be taken to check that the ICD is in the right place.  You will need to stay in the hospital for 1-2 days so your health care provider can make sure your ICD is working.  Do not drive for 24 hours if you received a sedative. Ask your health care provider when it is safe for you to drive.  You may be given an identification card explaining that you have an ICD. Summary  An implantable cardioverter defibrillator (ICD) is a small device that is placed under the skin in the chest or abdomen. It is used to detect and correct dangerous irregular heartbeats (arrhythmias).  An ICD consists of a battery, a small computer (pulse generator), and wires (leads) that go into the heart.  When an ICD detects rapid heart rhythm (tachycardia), it sends a low-energy shock to the heart to restore the heartbeat to normal (cardioversion). If cardioversion does not work or if the ICD detects uncoordinated  heart contractions (fibrillation), it delivers a high-energy shock to the heart (defibrillation) to restart the heart.  You will need to stay in the hospital for 1-2 days to make sure your ICD is working. This information is not intended to replace advice given to you by your health care provider. Make sure you discuss any questions you have with your health care provider. Document Released: 03/03/2002 Document Revised: 06/20/2016 Document Reviewed: 06/20/2016 Elsevier Interactive Patient Education  2017 [employer].

## 2017-12-25 NOTE — Progress Notes (Signed)
HPI [name] is referred today by Dr. Reine Just to discuss insertion of an ICD for primary prevention of malignant ventricular arrhythmias. He arrived 45 minutes late for his visit. He ran out of gas. He is a pleasant 42 yo man with a long h/o CHF and LV dysfuncfunction with a non-ischemic. CM. He has been on maximal medical therapy and has had 2 hospitalizations over the past year for CHF. He denies medical or dietary non-compliance. He has class 2 symptoms. He has never had syncope. He notes that CHF runs in his family.  No Known Allergies   Current Outpatient Medications  Medication Sig Dispense Refill  . digoxin (LANOXIN) 0.125 MG tablet Take 1 tablet (0.125 mg total) by mouth daily. 30 tablet 3  . furosemide (LASIX) 40 MG tablet Take 1 tablet (40 mg total) by mouth daily. 90 tablet 3  . glipiZIDE (GLUCOTROL) 5 MG tablet TAKE 1 TABLET BY MOUTH TWICE DAILY ** NEEDS NEW DOCTOR FOR FURTHER FILLS** 60 tablet 0  . metoprolol succinate (TOPROL-XL) 100 MG 24 hr tablet Take 1 tablet (100 mg total) by mouth 2 (two) times daily. Take with or immediately following a meal. 60 tablet 3  . rivaroxaban (XARELTO) 20 MG TABS tablet Take 1 tablet (20 mg total) daily with supper by mouth. 90 tablet 3  . sacubitril-valsartan (ENTRESTO) 49-51 MG Take 1 tablet by mouth 2 (two) times daily. 60 tablet 3  . spironolactone (ALDACTONE) 25 MG tablet Take 1 tablet (25 mg total) by mouth daily. 30 tablet 3   No current facility-administered medications for this visit.      Past Medical History:  Diagnosis Date  . Acute on chronic systolic (congestive) heart failure ([place]) 02/11/2017  . Chronic systolic heart failure ([place])   . Colitis 10/12/10   ER visit  . Elevated LFTs    likely hepatic congestion from CHF  . history Marijuana smoker   . Hypertension 10/18/2011  . Mitral valve regurgitation: Moderate to severe per 2 d echo 02/13/2017 02/13/2017  . Non-ischemic cardiomyopathy ([place]) 10/20/10   a. cath 11/22/10:  normal cors; EF 15-20%;  b. echo 11/19/10: EF 20%, mild MR, mild LAE, PASP 48; c. dx 5/12, tx with IV milrinone for low output, Coxsackie B2 titer elevated; o/w HIV, CMV, RMSF, ANA, RF, ESR, ACE all normal    ROS:   All systems reviewed and negative except as noted in the HPI.   Past Surgical History:  Procedure Laterality Date  . heart catheterization  11/21/10   Dr. Peter Martinique     Family History  Problem Relation Age of Onset  . Heart disease Mother   . Heart failure Mother   . Rheumatic fever Mother   . Sleep apnea Father   . Diabetes Father   . Hearing loss Paternal Grandfather 82       CHF  . Colon polyps Unknown        Grandfather  . Sleep apnea Unknown   . Heart disease Maternal Grandmother      Social History   Socioeconomic History  . Marital status: Single    Spouse name: Not on file  . Number of children: 2  . Years of education: Not on file  . Highest education level: Not on file  Occupational History  . Occupation: culinary/management  Social Needs  . Financial resource strain: Not on file  . Food insecurity:    Worry: Not on file    Inability: Not on  file  . Transportation needs:    Medical: Not on file    Non-medical: Not on file  Tobacco Use  . Smoking status: Never Smoker  . Smokeless tobacco: Never Used  Substance and Sexual Activity  . Alcohol use: No  . Drug use: Yes    Comment: occasional marijuana  . Sexual activity: Yes  Lifestyle  . Physical activity:    Days per week: Not on file    Minutes per session: Not on file  . Stress: Not on file  Relationships  . Social connections:    Talks on phone: Not on file    Gets together: Not on file    Attends religious service: Not on file    Active member of club or organization: Not on file    Attends meetings of clubs or organizations: Not on file    Relationship status: Not on file  . Intimate partner violence:    Fear of current or ex partner: Not on file    Emotionally abused:  Not on file    Physically abused: Not on file    Forced sexual activity: Not on file  Other Topics Concern  . Not on file  Social History Narrative   Lives with daughter who is 28 months old and son who is 74 years old     BP 98/64   Pulse 98   Ht 6' (1.829 m)   Wt 219 lb (99.3 kg)   SpO2 96%   BMI 29.70 kg/m   Physical Exam:  Well appearing 42 yo man, NAD HEENT: Unremarkable Neck:  No JVD, no thyromegally Lymphatics:  No adenopathy Back:  No CVA tenderness Lungs:  Clear with no wheezes HEART:  Regular rate rhythm, no murmurs, no rubs, no clicks Abd:  soft, positive bowel sounds, no organomegally, no rebound, no guarding Ext:  2 plus pulses, no edema, no cyanosis, no clubbing Skin:  No rashes no nodules Neuro:  CN II through XII intact, motor grossly intact  EKG - reviewed - NSR with LVH   Assess/Plan: 1. Chronic systolic heart failure - his symptoms are class 2A. He is on maximal medical therapy. I have discussed the indictions/risks/benefits/goals/expectations of ICD insertion. We also discussed standard ICD and S-ICD. He will call us. He would like a standard ICD. 2. HTN - his blood pressure is very well controlled. He is encouraged to maintain a low sodium diet.  Mikle Bosworth.D.

## 2017-12-27 ENCOUNTER — Encounter: Payer: Self-pay | Admitting: Internal Medicine

## 2017-12-27 ENCOUNTER — Ambulatory Visit (INDEPENDENT_AMBULATORY_CARE_PROVIDER_SITE_OTHER): Payer: Medicaid Other | Admitting: Internal Medicine

## 2017-12-27 VITALS — BP 100/64 | HR 90 | Ht 72.0 in | Wt 216.2 lb

## 2017-12-27 DIAGNOSIS — E1159 Type 2 diabetes mellitus with other circulatory complications: Secondary | ICD-10-CM | POA: Diagnosis not present

## 2017-12-27 MED ORDER — GLUCOSE BLOOD VI STRP: ORAL_STRIP | 3 refills | Status: AC

## 2017-12-27 MED ORDER — ACCU-CHEK MULTICLIX LANCETS MISC: 3 refills | Status: DC

## 2017-12-27 MED ORDER — EMPAGLIFLOZIN 25 MG PO TABS: 25.0000 mg | ORAL_TABLET | Freq: Every day | ORAL | 5 refills | Status: DC

## 2017-12-27 MED ORDER — METFORMIN HCL ER 500 MG PO TB24: 500.0000 mg | ORAL_TABLET | Freq: Every day | ORAL | 5 refills | Status: DC

## 2017-12-27 MED ORDER — ACCU-CHEK AVIVA DEVI: 0 refills | Status: AC

## 2017-12-27 NOTE — Patient Instructions (Addendum)
Please start: - Metformin ER 500 mg with dinner x 4 days, then increase to 500 mg 2x a day with meals - Jardiance 25 mg daily before b'fast  Please stop: - Glipizide  Please reduce sweets as we discussed.  Please call and schedule an eye appt with Dr. Randon Goldsmith: [facility]:  Dr. Jeni Salles MD ?  Address: [address] [place], [place], [place] [address]  [phone]  Please let me know if the sugars are consistently <80 or >200.  Please return in 3 months with your sugar log.   PATIENT INSTRUCTIONS FOR TYPE 2 DIABETES:  **Please join MyChart!** - see attached instructions about how to join if you have not done so already.  DIET AND EXERCISE Diet and exercise is an important part of diabetic treatment.  We recommended aerobic exercise in the form of brisk walking (working between 40-60% of maximal aerobic capacity, similar to brisk walking) for 150 minutes per week (such as 30 minutes five days per week) along with 3 times per week performing 'resistance' training (using various gauge rubber tubes with handles) 5-10 exercises involving the major muscle groups (upper body, lower body and core) performing 10-15 repetitions (or near fatigue) each exercise. Start at half the above goal but build slowly to reach the above goals. If limited by weight, joint pain, or disability, we recommend daily walking in a swimming pool with water up to waist to reduce pressure from joints while allow for adequate exercise.    BLOOD GLUCOSES Monitoring your blood glucoses is important for continued management of your diabetes. Please check your blood glucoses 2-4 times a day: fasting, before meals and at bedtime (you can rotate these measurements - e.g. one day check before the 3 meals, the next day check before 2 of the meals and before bedtime, etc.).   HYPOGLYCEMIA (low blood sugar) Hypoglycemia is usually a reaction to not eating, exercising, or taking too much insulin/ other  diabetes drugs.  Symptoms include tremors, sweating, hunger, confusion, headache, etc. Treat IMMEDIATELY with 15 grams of Carbs: . 4 glucose tablets .  cup regular juice/soda . 2 tablespoons raisins . 4 teaspoons sugar . 1 tablespoon honey Recheck blood glucose in 15 mins and repeat above if still symptomatic/blood glucose <100.  RECOMMENDATIONS TO REDUCE YOUR RISK OF DIABETIC COMPLICATIONS: * Take your prescribed MEDICATION(S) * Follow a DIABETIC diet: Complex carbs, fiber rich foods, (monounsaturated and polyunsaturated) fats * AVOID saturated/trans fats, high fat foods, >2,300 mg salt per day. * EXERCISE at least 5 times a week for 30 minutes or preferably daily.  * DO NOT SMOKE OR DRINK more than 1 drink a day. * Check your FEET every day. Do not wear tightfitting shoes. Contact us if you develop an ulcer * See your EYE doctor once a year or more if needed * Get a FLU shot once a year * Get a PNEUMONIA vaccine once before and once after age 34 years  GOALS:  * Your Hemoglobin A1c of <7%  * fasting sugars need to be <130 * after meals sugars need to be <180 (2h after you start eating) * Your Systolic BP should be 140 or lower  * Your Diastolic BP should be 80 or lower  * Your HDL (Good Cholesterol) should be 40 or higher  * Your LDL (Bad Cholesterol) should be 100 or lower. * Your Triglycerides should be 150 or lower  * Your Urine microalbumin (kidney function) should be <30 * Your Body Mass  Index should be 25 or lower    Please consider the following ways to cut down carbs and fat and increase fiber and micronutrients in your diet: - substitute whole grain for white bread or pasta - substitute brown rice for white rice - substitute 90-calorie flat bread pieces for slices of bread when possible - substitute sweet potatoes or yams for white potatoes - substitute humus for margarine - substitute tofu for cheese when possible - substitute almond or rice milk for regular milk  (would not drink soy milk daily due to concern for soy estrogen influence on breast cancer risk) - substitute dark chocolate for other sweets when possible - substitute water - can add lemon or orange slices for taste - for diet sodas (artificial sweeteners will trick your body that you can eat sweets without getting calories and will lead you to overeating and weight gain in the long run) - do not skip breakfast or other meals (this will slow down the metabolism and will result in more weight gain over time)  - can try smoothies made from fruit and almond/rice milk in am instead of regular breakfast - can also try old-fashioned (not instant) oatmeal made with almond/rice milk in am - order the dressing on the side when eating salad at a restaurant (pour less than half of the dressing on the salad) - eat as little meat as possible - can try juicing, but should not forget that juicing will get rid of the fiber, so would alternate with eating raw veg./fruits or drinking smoothies - use as little oil as possible, even when using olive oil - can dress a salad with a mix of balsamic vinegar and lemon juice, for e.g. - use agave nectar, stevia sugar, or regular sugar rather than artificial sweateners - steam or broil/roast veggies  - snack on veggies/fruit/nuts (unsalted, preferably) when possible, rather than processed foods - reduce or eliminate aspartame in diet (it is in diet sodas, chewing gum, etc) Read the labels!  Try to read Dr. Katherina Right book: "Program for Reversing Diabetes" for other ideas for healthy eating.

## 2017-12-27 NOTE — Progress Notes (Signed)
Patient ID: Tyrone Walker, male   DOB: 10-08-1975, 42 y.o.   MRN: [id number]   HPI: Tyrone Walker is a 42 y.o.-year-old male, referred by his cardiologist, Dr. Haroldine Laws, for management of DM2, dx in 2013, non-insulin-dependent, uncontrolled, with complications (CHF, mild CKD). No PCP.  Last hemoglobin A1c was: Lab Results  Component Value Date   HGBA1C 10.8 (H) 12/12/2017   HGBA1C 10.8 (H) 10/18/2017   HGBA1C 12.8 (H) 05/09/2017   HGBA1C 11.5 (H) 02/11/2017   HGBA1C 6.6 10/18/2011   Pt is on a regimen of: - Glipizide 5 mg 2x a day after meals  Pt not checking his sugars - has no meter. When checked with his mom's - occasionally: 200-300. Lowest: 57.  Highest: 500.  Pt's meals are: - Breakfast: 2-3 eggs + grits - Lunch: 2 sandwiches - Dinner: meat + veggies + rice or other starch, pizza 3x a week - Snacks: a lot of sweets: cakes, sodas  - + mild CKD, last BUN/creatinine:  Lab Results  Component Value Date   BUN 10 12/12/2017   BUN 13 11/15/2017   CREATININE 1.19 12/12/2017   CREATININE 1.24 11/15/2017  On Entresto. - + HL;  last set of lipids: Lab Results  Component Value Date   CHOL 110 02/14/2017   HDL 29 (L) 02/14/2017   LDLCALC 58 02/14/2017   TRIG 116 02/14/2017   CHOLHDL 3.8 02/14/2017   - no Diabetic eye exam - no numbness and tingling in his feet.  Pt has FH of DM in M and F, MGM, MGF.  ROS: Constitutional: + Fatigue, + decreased appetite, + poor sleep, no weight gain Eyes: + Blurry vision, no xerophthalmia ENT: no sore throat, no nodules palpated in throat, no dysphagia/odynophagia, no hoarseness Cardiovascular: no CP/+ SOB/no palpitations/leg swelling Respiratory: no cough/+ SOB Gastrointestinal: no N/V/D/C/+ heartburn Musculoskeletal: no muscle/joint aches Skin: no rashes, + rash, + easy bruising, + itching Neurological: no tremors/numbness/tingling/dizziness, + headache Psychiatric: no depression/+ anxiety  Past Medical History:  Diagnosis  Date  . Acute on chronic systolic (congestive) heart failure ([place]) 02/11/2017  . Chronic systolic heart failure ([place])   . Colitis 10/12/10   ER visit  . Elevated LFTs    likely hepatic congestion from CHF  . history Marijuana smoker   . Hypertension 10/18/2011  . Mitral valve regurgitation: Moderate to severe per 2 d echo 02/13/2017 02/13/2017  . Non-ischemic cardiomyopathy ([place]) 10/20/10   a. cath 11/22/10: normal cors; EF 15-20%;  b. echo 11/19/10: EF 20%, mild MR, mild LAE, PASP 48; c. dx 5/12, tx with IV milrinone for low output, Coxsackie B2 titer elevated; o/w HIV, CMV, RMSF, ANA, RF, ESR, ACE all normal   Past Surgical History:  Procedure Laterality Date  . heart catheterization  11/21/10   Dr. Peter Martinique   Social History   Socioeconomic History  . Marital status: Single    Spouse name: Not on file  . Number of children: 2  . Years of education: Not on file  . Highest education level: Not on file  Occupational History  . Occupation: culinary/management  Social Needs  . Financial resource strain: Not on file  . Food insecurity:    Worry: Not on file    Inability: Not on file  . Transportation needs:    Medical: Not on file    Non-medical: Not on file  Tobacco Use  . Smoking status: Never Smoker  . Smokeless tobacco: Never Used  Substance and Sexual Activity  . Alcohol use:  No  . Drug use: Yes    Comment: occasional marijuana  . Sexual activity: Yes  Lifestyle  . Physical activity:    Days per week: Not on file    Minutes per session: Not on file  . Stress: Not on file  Relationships  . Social connections:    Talks on phone: Not on file    Gets together: Not on file    Attends religious service: Not on file    Active member of club or organization: Not on file    Attends meetings of clubs or organizations: Not on file    Relationship status: Not on file  . Intimate partner violence:    Fear of current or ex partner: Not on file    Emotionally abused: Not on  file    Physically abused: Not on file    Forced sexual activity: Not on file  Other Topics Concern  . Not on file  Social History Narrative   Lives with daughter who is 40 months old and son who is 67 years old   Current Outpatient Medications on File Prior to Visit  Medication Sig Dispense Refill  . digoxin (LANOXIN) 0.125 MG tablet Take 1 tablet (0.125 mg total) by mouth daily. 30 tablet 3  . furosemide (LASIX) 40 MG tablet Take 1 tablet (40 mg total) by mouth daily. 90 tablet 3  . glipiZIDE (GLUCOTROL) 5 MG tablet TAKE 1 TABLET BY MOUTH TWICE DAILY ** NEEDS NEW DOCTOR FOR FURTHER FILLS** 60 tablet 0  . metoprolol succinate (TOPROL-XL) 100 MG 24 hr tablet Take 1 tablet (100 mg total) by mouth 2 (two) times daily. Take with or immediately following a meal. 60 tablet 3  . rivaroxaban (XARELTO) 20 MG TABS tablet Take 1 tablet (20 mg total) daily with supper by mouth. 90 tablet 3  . sacubitril-valsartan (ENTRESTO) 49-51 MG Take 1 tablet by mouth 2 (two) times daily. 60 tablet 3  . spironolactone (ALDACTONE) 25 MG tablet Take 1 tablet (25 mg total) by mouth daily. 30 tablet 3   No current facility-administered medications on file prior to visit.    No Known Allergies Family History  Problem Relation Age of Onset  . Heart disease Mother   . Heart failure Mother   . Rheumatic fever Mother   . Sleep apnea Father   . Diabetes Father   . Hearing loss Paternal Grandfather 64       CHF  . Colon polyps Unknown        Grandfather  . Sleep apnea Unknown   . Heart disease Maternal Grandmother     PE:l BP 100/64   Pulse 90   Ht 6' (1.829 m)   Wt 216 lb 3.2 oz (98.1 kg)   SpO2 97%   BMI 29.32 kg/m  Wt Readings from Last 3 Encounters:  12/27/17 216 lb 3.2 oz (98.1 kg)  12/25/17 219 lb (99.3 kg)  12/12/17 217 lb 12.8 oz (98.8 kg)   Constitutional: overweight, in NAD Eyes: PERRLA, EOMI, no exophthalmos ENT: moist mucous membranes, no thyromegaly, no cervical  lymphadenopathy Cardiovascular: tachycardia, RR, No MRG Respiratory: CTA B Gastrointestinal: abdomen soft, NT, ND, BS+ Musculoskeletal: no deformities, strength intact in all 4 Skin: moist, warm, no rashes Neurological: no tremor with outstretched hands, DTR normal in all 4  ASSESSMENT: 1. DM2, non-insulin-dependent, uncontrolled, with complications - CHF - mild CKD  PLAN:  1. Patient with several years of uncontrolled diabetes, on oral antidiabetic regimen, which became insufficient.  He complains that taking glipizide drops his sugars too low.  He still takes them twice a day, but about optimizing his medication treatment usually after meals.  At this visit, we discussed  about optimizing his medication treatment and I suggested to retry metformin extended release a low dose (since he had diarrhea with higher doses) and I also suggested an SGLT2 inhibitor.  We will try to send the prescription for Jardiance to his pharmacy.  If this is not covered, we may try [place] or [place].  We will stop the glipizide to avoid hypoglycemia. - However, as his diet is the most significant culprit for his uncontrolled diabetes, we spent a significant amount of time discussing how to improve his diet.  He absolutely needs to cut down on his sweets.  He eats a lot of candy, cake,  and drinks soda.  He also eats fast food and for example has pizza more than 3 times a week and 2-3 eggs every morning.  We discussed the need to improve this.  I suggested a lower fat diet.  He refuses a referral to nutrition for now. - We also discussed about the possible need to start insulin, but he would like to hold off this to work on his diet.  I do agree that if he improved his diet we will probably not need this. - I suggested to:  Patient Instructions   Please start: - Metformin ER 500 mg with dinner x 4 days, then increase to 500 mg 2x a day with meals - Jardiance 25 mg daily before b'fast  Please stop: -  Glipizide  Please reduce sweets as we discussed.  Please call and schedule an eye appt with Dr. Prudencio Burly: [facility]:  Dr. Sherlyn Lick MD ?  Address: [address], [place], [place] [address]  [phone]  Please let me know if the sugars are consistently <80 or >200.  Please return in 3 months with your sugar log.   - Strongly advised him to start checking sugars at different times of the day - check 2 times a day, rotating checks - given sugar log and advised how to fill it and to bring it at next appt  - I sent a prescription for a glucometer to his pharmacy - given foot care handout and explained the principles  - given instructions for hypoglycemia management "15-15 rule"  - advised for yearly eye exams, given Dr. Prudencio Burly information - Return to clinic in 3 mo with sugar log   Philemon Kingdom, MD PhD [facility] Endocrinology

## 2017-12-30 ENCOUNTER — Other Ambulatory Visit: Payer: Self-pay

## 2017-12-30 ENCOUNTER — Encounter: Payer: Self-pay | Admitting: Cardiology

## 2017-12-30 MED ORDER — ACCU-CHEK SOFTCLIX LANCETS MISC: 12 refills | Status: DC

## 2017-12-30 MED ORDER — ACCU-CHEK SOFT TOUCH LANCETS MISC: 0 refills | Status: DC

## 2018-01-08 ENCOUNTER — Encounter (HOSPITAL_COMMUNITY): Payer: Medicaid Other

## 2018-01-13 ENCOUNTER — Telehealth: Payer: Self-pay | Admitting: Internal Medicine

## 2018-01-13 NOTE — Telephone Encounter (Signed)
Called patient and LVM to call me back with the time of day he would like his cardiac MRI scheduled. °

## 2018-01-16 ENCOUNTER — Ambulatory Visit (HOSPITAL_COMMUNITY): Payer: Medicaid Other | Attending: Cardiology

## 2018-01-16 ENCOUNTER — Other Ambulatory Visit (HOSPITAL_COMMUNITY): Payer: Self-pay | Admitting: *Deleted

## 2018-01-16 DIAGNOSIS — I5022 Chronic systolic (congestive) heart failure: Secondary | ICD-10-CM | POA: Insufficient documentation

## 2018-02-03 ENCOUNTER — Telehealth: Payer: Self-pay | Admitting: Internal Medicine

## 2018-02-03 NOTE — Telephone Encounter (Signed)
I have called this patient on the following days and LVMs to call back and let me know when he would like his MRI scheduled: 12-19-17, 12-25-17, 01-13-18 and 01-15-18.  I have not heard anything back from this patient regarding scheduling the cardiac MRI.

## 2018-02-12 ENCOUNTER — Ambulatory Visit: Payer: Medicaid Other | Admitting: Cardiology

## 2018-02-13 ENCOUNTER — Other Ambulatory Visit: Payer: Self-pay

## 2018-02-13 ENCOUNTER — Encounter (HOSPITAL_COMMUNITY): Payer: Self-pay | Admitting: Emergency Medicine

## 2018-02-13 ENCOUNTER — Emergency Department (HOSPITAL_COMMUNITY): Payer: Medicaid Other

## 2018-02-13 ENCOUNTER — Emergency Department (HOSPITAL_COMMUNITY)
Admission: EM | Admit: 2018-02-13 | Discharge: 2018-02-13 | Disposition: A | Discharge status: Home / Self Care | Payer: Medicaid Other | Attending: Emergency Medicine | Admitting: Emergency Medicine

## 2018-02-13 DIAGNOSIS — Z7984 Long term (current) use of oral hypoglycemic drugs: Secondary | ICD-10-CM | POA: Insufficient documentation

## 2018-02-13 DIAGNOSIS — I110 Hypertensive heart disease with heart failure: Secondary | ICD-10-CM | POA: Insufficient documentation

## 2018-02-13 DIAGNOSIS — Z79899 Other long term (current) drug therapy: Secondary | ICD-10-CM | POA: Insufficient documentation

## 2018-02-13 DIAGNOSIS — E119 Type 2 diabetes mellitus without complications: Secondary | ICD-10-CM | POA: Diagnosis not present

## 2018-02-13 DIAGNOSIS — I5023 Acute on chronic systolic (congestive) heart failure: Secondary | ICD-10-CM | POA: Insufficient documentation

## 2018-02-13 DIAGNOSIS — R079 Chest pain, unspecified: Secondary | ICD-10-CM | POA: Diagnosis present

## 2018-02-13 LAB — CBC
HEMATOCRIT: 47.7 % (ref 39.0–52.0)
Hemoglobin: 15.8 g/dL (ref 13.0–17.0)
MCH: 30 pg (ref 26.0–34.0)
MCHC: 33.1 g/dL (ref 30.0–36.0)
MCV: 90.7 fL (ref 78.0–100.0)
PLATELETS: 215 10*3/uL (ref 150–400)
RBC: 5.26 MIL/uL (ref 4.22–5.81)
RDW: 13 % (ref 11.5–15.5)
WBC: 8.5 10*3/uL (ref 4.0–10.5)

## 2018-02-13 LAB — BASIC METABOLIC PANEL
Anion gap: 12 (ref 5–15)
BUN: 8 mg/dL (ref 6–20)
CHLORIDE: 103 mmol/L (ref 98–111)
CO2: 25 mmol/L (ref 22–32)
CREATININE: 1.18 mg/dL (ref 0.61–1.24)
Calcium: 9.3 mg/dL (ref 8.9–10.3)
GFR calc Af Amer: >60 mL/min (ref >60)
GFR calc non Af Amer: >60 mL/min (ref >60)
GLUCOSE: 157 mg/dL — AB (ref 70–99)
POTASSIUM: 3.6 mmol/L (ref 3.5–5.1)
SODIUM: 140 mmol/L (ref 135–145)

## 2018-02-13 LAB — I-STAT TROPONIN, ED
Troponin i, poc: 0.02 ng/mL (ref 0.00–0.08)
Troponin i, poc: 0.01 ng/mL (ref 0.00–0.08)

## 2018-02-13 LAB — BRAIN NATRIURETIC PEPTIDE: B NATRIURETIC PEPTIDE 5: 913.8 pg/mL — AB (ref 0.0–100.0)

## 2018-02-13 LAB — DIGOXIN LEVEL: Digoxin Level: <0.2 ng/mL — ABNORMAL LOW (ref 0.8–2.0)

## 2018-02-13 MED ORDER — FUROSEMIDE 10 MG/ML IJ SOLN
40.0000 mg | Freq: Once | INTRAMUSCULAR | Status: AC
  Administered 2018-02-13: 40 mg via INTRAVENOUS
  Filled 2018-02-13: qty 4

## 2018-02-13 NOTE — ED Triage Notes (Signed)
Pt presents with 2 days of ongoing SOB with CP that worsened last night and woke him from sleep; pt also c/o fever, chills, fatigue, abd pain, and nausea and diaphoresis

## 2018-02-13 NOTE — ED Provider Notes (Signed)
[facility] EMERGENCY DEPARTMENT Provider Note   CSN: [id number] Arrival date & time: 02/13/18  0228     History   Chief Complaint Chief Complaint  Patient presents with  . Chest Pain  . Shortness of Breath  . Fatigue  . Fever  . Nausea  . Diaphoresis    HPI Tyrone Walker is a 42 y.o. male.  The history is provided by the patient and medical records. No language interpreter was used.  Chest Pain   Associated symptoms include a fever and shortness of breath.  Shortness of Breath  Associated symptoms include a fever and chest pain.  Fever   Associated symptoms include chest pain.    42 year old male with history of CHF with an EF of 15/20 presents, nonischemic cardiomyopathy, hypertension, mitral valve regurgitation presenting complaining of chest pain shortness of breath.  Patient report for the past 2 days he has had recurrent chest pressure sensation to his mid chest especially when he lays down.  He also endorsed increased shortness of breath with laying flat as well as with walking.  He report feeling dehydrated.  Endorsed subjective fever and chills and nonproductive cough.  He rates his symptoms as moderate.  He denies exertional chest pain, abdominal pain, leg swelling or calf pain.  He does weigh himself on a daily basis and states that his dry weight has remained the same at 214 pounds.  He is not a smoker.  He did take his Lasix last night and states that he has been compliant with his medication.  Past Medical History:  Diagnosis Date  . Acute on chronic systolic (congestive) heart failure ([place]) 02/11/2017  . Chronic systolic heart failure ([place])   . Colitis 10/12/10   ER visit  . Elevated LFTs    likely hepatic congestion from CHF  . history Marijuana smoker   . Hypertension 10/18/2011  . Mitral valve regurgitation: Moderate to severe per 2 d echo 02/13/2017 02/13/2017  . Non-ischemic cardiomyopathy ([place]) 10/20/10   a. cath 11/22/10: normal cors;  EF 15-20%;  b. echo 11/19/10: EF 20%, mild MR, mild LAE, PASP 48; c. dx 5/12, tx with IV milrinone for low output, Coxsackie B2 titer elevated; o/w HIV, CMV, RMSF, ANA, RF, ESR, ACE all normal    Patient Active Problem List   Diagnosis Date Noted  . Acute on chronic diastolic (congestive) heart failure ([place]) 02/13/2017  . Mitral valve regurgitation: Moderate to severe per 2 d echo 02/13/2017 02/13/2017  . Acute on chronic combined systolic and diastolic CHF (congestive heart failure) ([place]) 02/11/2017  . AKI (acute kidney injury) ([place]) 02/11/2017  . Acute bacterial sinusitis 03/06/2012  . Type 2 diabetes mellitus with vascular disease ([place]) 10/23/2011  . Molluscum contagiosum 10/18/2011  . Dyshidrotic eczema 10/18/2011  . Hypertension 10/18/2011  . Allergic rhinitis 10/09/2011  . OSA (obstructive sleep apnea) 02/16/2011  . Blurred vision, bilateral 12/05/2010  . Chronic systolic heart failure ([place])   . Nonspecific (abnormal) findings on radiological and other examination of abdominal area, including retroperitoneum 11/17/2010  . Non-ischemic cardiomyopathy ([place]) 10/20/2010    Past Surgical History:  Procedure Laterality Date  . heart catheterization  11/21/10   Dr. Peter Martinique        Home Medications    Prior to Admission medications   Medication Sig Start Date End Date Taking? Authorizing Provider  ACCU-CHEK SOFTCLIX LANCETS lancets Use as instructed 12/30/17   Philemon Kingdom, MD  Blood Glucose Monitoring Suppl (ACCU-CHEK AVIVA) device Use as  instructed 12/27/17 12/27/18  Philemon Kingdom, MD  digoxin (LANOXIN) 0.125 MG tablet Take 1 tablet (0.125 mg total) by mouth daily. 09/12/17   Bensimhon, Shaune Pascal, MD  empagliflozin (JARDIANCE) 25 MG TABS tablet Take 25 mg by mouth daily. 12/27/17   Philemon Kingdom, MD  furosemide (LASIX) 40 MG tablet Take 1 tablet (40 mg total) by mouth daily. 09/12/17   Bensimhon, Shaune Pascal, MD  glipiZIDE (GLUCOTROL) 5 MG tablet TAKE 1 TABLET BY MOUTH TWICE  DAILY ** NEEDS NEW DOCTOR FOR FURTHER FILLS** 12/19/17   Bensimhon, Shaune Pascal, MD  glucose blood (ACCU-CHEK AVIVA) test strip Use as instructed 2x a day 12/27/17   Philemon Kingdom, MD  metFORMIN (GLUCOPHAGE-XR) 500 MG 24 hr tablet Take 1 tablet (500 mg total) by mouth daily with breakfast. 12/27/17   Philemon Kingdom, MD  metoprolol succinate (TOPROL-XL) 100 MG 24 hr tablet Take 1 tablet (100 mg total) by mouth 2 (two) times daily. Take with or immediately following a meal. 11/11/17   Bensimhon, Shaune Pascal, MD  rivaroxaban (XARELTO) 20 MG TABS tablet Take 1 tablet (20 mg total) daily with supper by mouth. 05/09/17   Tillery, Satira Mccallum, PA-C  sacubitril-valsartan (ENTRESTO) 49-51 MG Take 1 tablet by mouth 2 (two) times daily. 12/12/17   Bensimhon, Shaune Pascal, MD  spironolactone (ALDACTONE) 25 MG tablet Take 1 tablet (25 mg total) by mouth daily. 11/11/17   Bensimhon, Shaune Pascal, MD    Family History Family History  Problem Relation Age of Onset  . Heart disease Mother   . Heart failure Mother   . Rheumatic fever Mother   . Sleep apnea Father   . Diabetes Father   . Hearing loss Paternal Grandfather 43       CHF  . Colon polyps Unknown        Grandfather  . Sleep apnea Unknown   . Heart disease Maternal Grandmother     Social History Social History   Tobacco Use  . Smoking status: Never Smoker  . Smokeless tobacco: Never Used  Substance Use Topics  . Alcohol use: No  . Drug use: Yes    Comment: occasional marijuana     Allergies   Patient has no known allergies.   Review of Systems Review of Systems  Constitutional: Positive for fever.  Respiratory: Positive for shortness of breath.   Cardiovascular: Positive for chest pain.  All other systems reviewed and are negative.    Physical Exam Updated Vital Signs BP (!) 135/93   Pulse 96   Temp 99.3 F (37.4 C) (Oral)   Resp 18   Ht 6' 1"  (1.854 m)   Wt 97.1 kg   SpO2 98%   BMI 28.23 kg/m   Physical Exam    Constitutional: He appears well-developed and well-nourished. No distress.  HENT:  Head: Atraumatic.  Eyes: Conjunctivae are normal.  Neck: Neck supple. No JVD present.  Cardiovascular: Normal rate, regular rhythm, intact distal pulses and normal pulses.  Murmur heard. Pulmonary/Chest: Effort normal and breath sounds normal. He has no decreased breath sounds. He has no wheezes. He has no rhonchi. He has no rales.  Abdominal: Soft. There is no tenderness.  Musculoskeletal: Normal range of motion.       Right lower leg: He exhibits no edema.       Left lower leg: He exhibits no edema.  Neurological: He is alert.  Skin: No rash noted.  Psychiatric: He has a normal mood and affect.  Nursing note and vitals  reviewed.    ED Treatments / Results  Labs (all labs ordered are listed, but only abnormal results are displayed) Labs Reviewed  BASIC METABOLIC PANEL - Abnormal; Notable for the following components:      Result Value   Glucose, Bld 157 (*)    All other components within normal limits  BRAIN NATRIURETIC PEPTIDE - Abnormal; Notable for the following components:   B Natriuretic Peptide 913.8 (*)    All other components within normal limits  DIGOXIN LEVEL - Abnormal; Notable for the following components:   Digoxin Level <0.2 (*)    All other components within normal limits  CBC  I-STAT TROPONIN, ED  I-STAT TROPONIN, ED    EKG EKG Interpretation  Date/Time:  Thursday February 13 2018 02:32:20 EDT Ventricular Rate:  97 PR Interval:  176 QRS Duration: 96 QT Interval:  400 QTC Calculation: 508 R Axis:   102 Text Interpretation:  Normal sinus rhythm Possible Left atrial enlargement Rightward axis Prolonged QT Abnormal ECG Left ventricular hypertrophy When compared with ECG of 12/12/2017, QT has lengthened Premature ventricular complexes are no longer present Reconfirmed by Delora Fuel ([id number]) on 02/13/2018 6:26:14 AM   Radiology Dg Chest 2 View  Result Date:  02/13/2018 CLINICAL DATA:  Chest pain and shortness of breath, worsened last night. EXAM: CHEST - 2 VIEW COMPARISON:  Radiograph 08/23/2017 FINDINGS: Unchanged cardiomegaly. Unchanged mediastinal contours. No focal consolidation, pulmonary edema, pleural effusion or pneumothorax. No acute osseous abnormalities. IMPRESSION: Cardiomegaly without CHF or acute chest finding. Electronically Signed   By: Jeb Levering M.D.   On: 02/13/2018 02:49    Procedures Procedures (including critical care time)  Medications Ordered in ED Medications  furosemide (LASIX) injection 40 mg (40 mg Intravenous Given 02/13/18 0643)     Initial Impression / Assessment and Plan / ED Course  I have reviewed the triage vital signs and the nursing notes.  Pertinent labs & imaging results that were available during my care of the patient were reviewed by me and considered in my medical decision making (see chart for details).     BP (!) 134/107   Pulse 92   Temp 99.3 F (37.4 C) (Oral)   Resp 12   Ht 6' 1"  (1.854 m)   Wt 97.1 kg   SpO2 95%   BMI 28.23 kg/m    Final Clinical Impressions(s) / ED Diagnoses   Final diagnoses:  Acute on chronic systolic congestive heart failure [facility])    ED Discharge Orders    None     6:29 AM Patient with known history of CHF here complaining of chest pressure and sensation as well as shortness of breath.  Symptoms suggestive of CHF exacerbation however patient is clinically well-appearing, does not appear to be fluid overload and is resting comfortably.  Labs remarkable for an elevated BNP of 913 however chest x-ray showed no pleural effusion or pulmonary edema.  EKG shows prolonged QT but no acute ischemic changes.  Initial troponin is negative, will obtain delta troponin.  The remainder of his labs are reassuring.  Plan to check digoxin level.  Patient will also be given an IV dose of Lasix.  8:47 AM Normal delta troponin.  Digoxin level is subtherapeutic.  After  receiving IV Lasix, patient urinate and felt much better.  At this time, I strongly encouraged patient to follow-up closely with his PCP in the next few days for further management of his CHF exacerbation.  He does have appointment with cardiologist, Dr. Haroldine Laws  next month.  I encourage patient to increase his Lasix to 80 mg daily  for the next 3 days and recheck with PCP.  Return precautions discussed.  Care discussed with Dr. Roxanne Mins.   Domenic Moras, PA-C 45/73/34 [id number]    Delora Fuel, MD 15/99/68 [phone]

## 2018-02-13 NOTE — Discharge Instructions (Addendum)
You have been diagnosed with CHF exacerbation causing increase shortness of breath.  You may increase your lasix dose to 80mg  daily for the next 3 days and follow up closely with your doctor for further care.  Return if you have any concerns.

## 2018-03-17 ENCOUNTER — Inpatient Hospital Stay (HOSPITAL_COMMUNITY)
Admission: RE | Admit: 2018-03-17 | Discharge: 2018-03-17 | Disposition: A | Discharge status: Home / Self Care | Payer: Medicaid Other | Source: Ambulatory Visit

## 2018-04-04 ENCOUNTER — Telehealth (HOSPITAL_COMMUNITY): Payer: Self-pay | Admitting: Pharmacist

## 2018-04-04 NOTE — Telephone Encounter (Signed)
Entresto PA approved by [place] Medicaid through 03/29/19.  [name]. Bonnye Fava, PharmD, BCPS, CPP Clinical Pharmacist Phone: [phone] 04/04/2018 11:27 AM

## 2018-04-07 ENCOUNTER — Ambulatory Visit (INDEPENDENT_AMBULATORY_CARE_PROVIDER_SITE_OTHER): Payer: Medicaid Other | Admitting: Physician Assistant

## 2018-04-18 ENCOUNTER — Ambulatory Visit (INDEPENDENT_AMBULATORY_CARE_PROVIDER_SITE_OTHER): Payer: Medicaid Other | Admitting: Internal Medicine

## 2018-04-18 ENCOUNTER — Encounter: Payer: Self-pay | Admitting: Internal Medicine

## 2018-04-18 VITALS — BP 120/82 | HR 87 | Ht 72.0 in | Wt 216.0 lb

## 2018-04-18 DIAGNOSIS — E1159 Type 2 diabetes mellitus with other circulatory complications: Secondary | ICD-10-CM | POA: Diagnosis not present

## 2018-04-18 DIAGNOSIS — E1122 Type 2 diabetes mellitus with diabetic chronic kidney disease: Secondary | ICD-10-CM

## 2018-04-18 DIAGNOSIS — E669 Obesity, unspecified: Secondary | ICD-10-CM

## 2018-04-18 DIAGNOSIS — N189 Chronic kidney disease, unspecified: Secondary | ICD-10-CM

## 2018-04-18 DIAGNOSIS — I509 Heart failure, unspecified: Secondary | ICD-10-CM

## 2018-04-18 DIAGNOSIS — E785 Hyperlipidemia, unspecified: Secondary | ICD-10-CM | POA: Insufficient documentation

## 2018-04-18 LAB — LIPID PANEL
CHOL/HDL RATIO: 4
CHOLESTEROL: 130 mg/dL (ref 0–200)
HDL: 36.5 mg/dL — ABNORMAL LOW (ref >39.00)
LDL Cholesterol: 54 mg/dL (ref 0–99)
NonHDL: 93.6
TRIGLYCERIDES: 198 mg/dL — AB (ref 0.0–149.0)
VLDL: 39.6 mg/dL (ref 0.0–40.0)

## 2018-04-18 LAB — POCT GLYCOSYLATED HEMOGLOBIN (HGB A1C): Hemoglobin A1C: 7 % — AB (ref 4.0–5.6)

## 2018-04-18 NOTE — Progress Notes (Signed)
Patient ID: Tyrone Walker, male   DOB: 1975/11/10, 42 y.o.   MRN: [id number]   HPI: Tyrone Walker is a 42 y.o.-year-old male, initially referred by his cardiologist, Dr. Haroldine Laws, for management of DM2, dx in 2013, non-insulin-dependent, uncontrolled, with complications (CHF, mild CKD).  Our first visit was 4 months ago. No PCP.  At last visit, he was eating a lot of concentrated sweets and we discussed about the importance of stopping these.  He stopped sugars since then >> sugars MUCH better!  Last hemoglobin A1c was: Lab Results  Component Value Date   HGBA1C 10.8 (H) 12/12/2017   HGBA1C 10.8 (H) 10/18/2017   HGBA1C 12.8 (H) 05/09/2017   HGBA1C 11.5 (H) 02/11/2017   HGBA1C 6.6 10/18/2011   Pt was on a regimen of: - Glipizide 5 mg 2x a day after meals  At last visit, we changed to: - Metformin ER 500 mg twice a day >> 500 mg with dinner 2x a week - Jardiance 25 mg daily in a.m.  At last visit, he was not checking sugars only occasionally and they were 200s to 300s.  Since last visit he started to check once a day: - am: 113-120 - 2h after b'fast: n/c - lunch: n/c - 2h after lunch: <150 - dinner: n/c - 2h after dinner: 120-130 - bedtime: n/c Lowest: 57 >> 68 (missed b'fast and lunch).  Highest: 500 >> 171.  Pt's meals are: - Breakfast: 2-3 eggs + grits - Lunch: 2 sandwiches - Dinner: meat + veggies + rice or other starch, pizza 3x a week - Snacks: stopped sweets: cakes, sodas  -+ Mild CKD, last BUN/creatinine:  Lab Results  Component Value Date   BUN 8 02/13/2018   BUN 10 12/12/2017   CREATININE 1.18 02/13/2018   CREATININE 1.19 12/12/2017  On Entresto. -+ HL;  last set of lipids: Lab Results  Component Value Date   CHOL 110 02/14/2017   HDL 29 (L) 02/14/2017   LDLCALC 58 02/14/2017   TRIG 116 02/14/2017   CHOLHDL 3.8 02/14/2017  He is not on a statin. - He did not see Dr. Prudencio Burly for a diabetic eye exam yet. - no numbness and tingling in his feet.  Pt has  FH of DM in M and F, MGM, MGF.  ROS: Constitutional:no weight loss, + fatigue, no subjective hyperthermia, no subjective hypothermia Eyes: no blurry vision, no xerophthalmia ENT: no sore throat, no nodules palpated in neck, no dysphagia, no odynophagia, no hoarseness Cardiovascular: no CP/no SOB/no palpitations/no leg swelling Respiratory: no cough/no SOB/no wheezing Gastrointestinal: no N/no V/no D/+ C/+ acid reflux Musculoskeletal: no muscle aches/no joint aches Skin: no rashes, no hair loss Neurological: no tremors/no numbness/no tingling/no dizziness  I reviewed pt's medications, allergies, PMH, social hx, family hx, and changes were documented in the history of present illness. Otherwise, unchanged from my initial visit note.  Past Medical History:  Diagnosis Date  . Acute on chronic systolic (congestive) heart failure ([place]) 02/11/2017  . Chronic systolic heart failure ([place])   . Colitis 10/12/10   ER visit  . Elevated LFTs    likely hepatic congestion from CHF  . history Marijuana smoker   . Hypertension 10/18/2011  . Mitral valve regurgitation: Moderate to severe per 2 d echo 02/13/2017 02/13/2017  . Non-ischemic cardiomyopathy ([place]) 10/20/10   a. cath 11/22/10: normal cors; EF 15-20%;  b. echo 11/19/10: EF 20%, mild MR, mild LAE, PASP 48; c. dx 5/12, tx with IV milrinone for low output, Coxsackie  B2 titer elevated; o/w HIV, CMV, RMSF, ANA, RF, ESR, ACE all normal   Past Surgical History:  Procedure Laterality Date  . heart catheterization  11/21/10   Dr. Peter Martinique   Social History   Socioeconomic History  . Marital status: Single    Spouse name: Not on file  . Number of children: 2  . Years of education: Not on file  . Highest education level: Not on file  Occupational History  . Occupation: culinary/management  Social Needs  . Financial resource strain: Not on file  . Food insecurity:    Worry: Not on file    Inability: Not on file  . Transportation needs:     Medical: Not on file    Non-medical: Not on file  Tobacco Use  . Smoking status: Never Smoker  . Smokeless tobacco: Never Used  Substance and Sexual Activity  . Alcohol use: No  . Drug use: Yes    Comment: occasional marijuana  . Sexual activity: Yes  Lifestyle  . Physical activity:    Days per week: Not on file    Minutes per session: Not on file  . Stress: Not on file  Relationships  . Social connections:    Talks on phone: Not on file    Gets together: Not on file    Attends religious service: Not on file    Active member of club or organization: Not on file    Attends meetings of clubs or organizations: Not on file    Relationship status: Not on file  . Intimate partner violence:    Fear of current or ex partner: Not on file    Emotionally abused: Not on file    Physically abused: Not on file    Forced sexual activity: Not on file  Other Topics Concern  . Not on file  Social History Narrative   Lives with daughter who is 82 months old and son who is 35 years old   Current Outpatient Medications on File Prior to Visit  Medication Sig Dispense Refill  . ACCU-CHEK SOFTCLIX LANCETS lancets Use as instructed 100 each 12  . Blood Glucose Monitoring Suppl (ACCU-CHEK AVIVA) device Use as instructed 1 each 0  . digoxin (LANOXIN) 0.125 MG tablet Take 1 tablet (0.125 mg total) by mouth daily. 30 tablet 3  . empagliflozin (JARDIANCE) 25 MG TABS tablet Take 25 mg by mouth daily. 30 tablet 5  . furosemide (LASIX) 40 MG tablet Take 1 tablet (40 mg total) by mouth daily. 90 tablet 3  . glipiZIDE (GLUCOTROL) 5 MG tablet TAKE 1 TABLET BY MOUTH TWICE DAILY ** NEEDS NEW DOCTOR FOR FURTHER FILLS** (Patient not taking: Reported on 02/13/2018) 60 tablet 0  . glucose blood (ACCU-CHEK AVIVA) test strip Use as instructed 2x a day 200 each 3  . metFORMIN (GLUCOPHAGE-XR) 500 MG 24 hr tablet Take 1 tablet (500 mg total) by mouth daily with breakfast. 60 tablet 5  . metoprolol succinate (TOPROL-XL) 100  MG 24 hr tablet Take 1 tablet (100 mg total) by mouth 2 (two) times daily. Take with or immediately following a meal. 60 tablet 3  . rivaroxaban (XARELTO) 20 MG TABS tablet Take 1 tablet (20 mg total) daily with supper by mouth. 90 tablet 3  . sacubitril-valsartan (ENTRESTO) 49-51 MG Take 1 tablet by mouth 2 (two) times daily. 60 tablet 3  . spironolactone (ALDACTONE) 25 MG tablet Take 1 tablet (25 mg total) by mouth daily. 30 tablet 3   No  current facility-administered medications on file prior to visit.    No Known Allergies Family History  Problem Relation Age of Onset  . Heart disease Mother   . Heart failure Mother   . Rheumatic fever Mother   . Sleep apnea Father   . Diabetes Father   . Hearing loss Paternal Grandfather 67       CHF  . Colon polyps Unknown        Grandfather  . Sleep apnea Unknown   . Heart disease Maternal Grandmother     PE:l BP 120/82   Pulse 87   Ht 6' (1.829 m) Comment: measured  Wt 216 lb (98 kg)   SpO2 97%   BMI 29.29 kg/m  Wt Readings from Last 3 Encounters:  04/18/18 216 lb (98 kg)  02/13/18 214 lb (97.1 kg)  12/27/17 216 lb 3.2 oz (98.1 kg)   Constitutional: overweight, in NAD Eyes: PERRLA, EOMI, no exophthalmos ENT: moist mucous membranes, no thyromegaly, no cervical lymphadenopathy Cardiovascular: RRR, No MRG Respiratory: CTA B Gastrointestinal: abdomen soft, NT, ND, BS+ Musculoskeletal: no deformities, strength intact in all 4 Skin: moist, warm, no rashes Neurological: no tremor with outstretched hands, DTR normal in all 4  ASSESSMENT: 1. DM2, non-insulin-dependent, uncontrolled, with complications - CHF - mild CKD  2. HL  3. Obesity  PLAN:  1. Patient with history of uncontrolled diabetes, on oral antidiabetic regimen, which we changed at last visit.  At that time, he was taking glipizide but this was dropping his sugars too low so we stopped glipizide and started him back on metformin extended release, a low dose, since he  had diarrhea with a higher dose.  We also started Jardiance especially in the context of his CHF.  We also discussed at length about improving his diet and I suggested to cut down on his sweets.  He was eating a lot of candy, cake, sodas.  He was also eating fast food with pizza more than 3 times a week in 2-3 exam the morning.  He refused a referral to nutrition event. -At this visit, he made significant changes in his diet and he greatly decreased his sweets intake.  His sugars decreased significantly, and even had one low blood sugar.  He tells me he is not taking metformin consistently as he is afraid that this will drop him too low.  He is taking only 1 tablet of metformin may be twice a week.  He does take his Jardiance. -At this visit, I advised him to continue metformin 1 tablet with dinner and also continue Jardiance and congratulated him for the dietary changes.  No other changes are needed. - I suggested to:  Patient Instructions  Please continue: - Metformin ER 500 mg 1x a day with dinner - Jardiance 25 mg daily before b'fast  Please call and schedule an eye appt with Dr. Prudencio Burly: [facility]:  Dr. Sherlyn Lick MD ?  Address: [address], [place], [place] [address]  [phone]  Please return in 4 months with your sugar log.   - today, HbA1c is 7.0% (MUCH improved) - continue checking sugars at different times of the day - check 1x a day, rotating checks - advised for yearly eye exams >> he is not UTD -again gave him Dr. Prudencio Burly information - Return to clinic in 4 mo with sugar log   2. HL - Reviewed latest lipid panel from 01/2017: HDL low, LDL at goal Lab Results  Component Value  Date   CHOL 110 02/14/2017   HDL 29 (L) 02/14/2017   LDLCALC 58 02/14/2017   TRIG 116 02/14/2017   CHOLHDL 3.8 02/14/2017  -He is not on a statin. -He is due for another lipid panel-we will check today (he had grits this morning)  3. Obesity -We will continue  Jardiance which should also help with weight loss -no weight loss since last visit  Component     Latest Ref Rng & Units 02/14/2017 04/18/2018  Cholesterol     0 - 200 mg/dL 110 130  Triglycerides     0.0 - 149.0 mg/dL 116 198.0 (H)  HDL Cholesterol     >39.00 mg/dL 29 (L) 36.50 (L)  VLDL     0.0 - 40.0 mg/dL 23 39.6  LDL (calc)     0 - 99 mg/dL 58 54  Total CHOL/HDL Ratio      3.8 4  NonHDL       93.60   LDL is at goal, triglycerides slightly high (he just had grades), HDL improved.  Philemon Kingdom, MD PhD [facility]

## 2018-04-18 NOTE — Patient Instructions (Signed)
Please continue: - Metformin ER 500 mg 1x a day with dinner - Jardiance 25 mg daily before b'fast  Please call and schedule an eye appt with Dr. Randon Goldsmith: [facility]:  Dr. Jeni Salles MD ?  Address: [address] [place], [place], [place] [address]  [phone]  Please return in 4 months with your sugar log.

## 2018-04-18 NOTE — Addendum Note (Signed)
Addended by: Darliss Ridgel I on: 04/18/2018 02:53 PM   Modules accepted: Orders

## 2018-04-20 NOTE — Progress Notes (Signed)
Thanks

## 2018-05-07 ENCOUNTER — Other Ambulatory Visit: Payer: Self-pay | Admitting: Internal Medicine

## 2018-05-09 ENCOUNTER — Other Ambulatory Visit (HOSPITAL_COMMUNITY): Payer: Self-pay

## 2018-05-09 MED ORDER — DIGOXIN 125 MCG PO TABS: 0.1250 mg | ORAL_TABLET | Freq: Every day | ORAL | 1 refills | Status: DC

## 2018-05-09 MED ORDER — METOPROLOL SUCCINATE ER 100 MG PO TB24: 100.0000 mg | ORAL_TABLET | Freq: Two times a day (BID) | ORAL | 1 refills | Status: DC

## 2018-06-29 ENCOUNTER — Other Ambulatory Visit (HOSPITAL_COMMUNITY): Payer: Self-pay | Admitting: Student

## 2018-08-19 ENCOUNTER — Ambulatory Visit: Payer: Medicaid Other | Admitting: Internal Medicine

## 2018-08-19 ENCOUNTER — Telehealth: Payer: Self-pay | Admitting: Internal Medicine

## 2018-08-19 NOTE — Telephone Encounter (Signed)
Patient no showed today's appt. Please advise on how to follow up. °A. No follow up necessary. °B. Follow up urgent. Contact patient immediately. °C. Follow up necessary. Contact patient and schedule visit in ___ days. °D. Follow up advised. Contact patient and schedule visit in ____weeks. ° °Would you like the NS fee to be applied to this visit? ° °

## 2018-08-19 NOTE — Telephone Encounter (Signed)
4 months

## 2018-08-19 NOTE — Progress Notes (Deleted)
Patient ID: Tyrone Walker, male   DOB: January 03, 1976, 43 y.o.   MRN: [id number]   HPI: Brodric Walker is a 43 y.o.-year-old male, initially referred by his cardiologist, Dr. Haroldine Laws, for management of DM2, dx in 2013, non-insulin-dependent, uncontrolled, with complications (CHF, mild CKD).Last visit 4 mo ago. No PCP.  Before last visit, he instituted dietary changes and we also started Jardiance and his sugars improved significantly.  Last hemoglobin A1c was: Lab Results  Component Value Date   HGBA1C 7.0 (A) 04/18/2018   HGBA1C 10.8 (H) 12/12/2017   HGBA1C 10.8 (H) 10/18/2017   HGBA1C 12.8 (H) 05/09/2017   HGBA1C 11.5 (H) 02/11/2017   HGBA1C 6.6 10/18/2011   Pt was on a regimen of: - Glipizide 5 mg 2x a day after meals  He is on: - Metformin ER 500 mg twice a day >> 500 mg with dinner 2x a week - Jardiance 25 mg daily in a.m.   He checks sugars 2x a day:  - am: 113-120 - 2h after b'fast: n/c - lunch: n/c - 2h after lunch: <150 - dinner: n/c - 2h after dinner: 120-130 - bedtime: n/c Lowest: 57 >> 68 >> ***.  Highest: 500 >> 171 >> ***.  Pt's meals are: - Breakfast: 2-3 eggs + grits - Lunch: 2 sandwiches - Dinner: meat + veggies + rice or other starch, pizza 3x a week - Snacks: stopped sweets: cakes, sodas  -+ mild CKD, last BUN/creatinine:  Lab Results  Component Value Date   BUN 8 02/13/2018   BUN 10 12/12/2017   CREATININE 1.18 02/13/2018   CREATININE 1.19 12/12/2017  On Entrsto. -+ HL;  last set of lipids: Lab Results  Component Value Date   CHOL 130 04/18/2018   HDL 36.50 (L) 04/18/2018   LDLCALC 54 04/18/2018   TRIG 198.0 (H) 04/18/2018   CHOLHDL 4 04/18/2018  He is not on a statin. -No diabetic eye exams yet despite repeated advised to schedule this. -He denies numbness and tingling in his feet.  Pt has FH of DM in M and F, MGM, MGF.  ROS: Constitutional: no weight gain/no weight loss, no fatigue, no subjective hyperthermia, no subjective  hypothermia Eyes: no blurry vision, no xerophthalmia ENT: no sore throat, no nodules palpated in neck, no dysphagia, no odynophagia, no hoarseness Cardiovascular: no CP/no SOB/no palpitations/no leg swelling Respiratory: no cough/no SOB/no wheezing Gastrointestinal: no N/no V/no D/no C/no acid reflux Musculoskeletal: no muscle aches/no joint aches Skin: no rashes, no hair loss Neurological: no tremors/no numbness/no tingling/no dizziness  I reviewed pt's medications, allergies, PMH, social hx, family hx, and changes were documented in the history of present illness. Otherwise, unchanged from my initial visit note.  Past Medical History:  Diagnosis Date  . Acute on chronic systolic (congestive) heart failure ([place]) 02/11/2017  . Chronic systolic heart failure ([place])   . Colitis 10/12/10   ER visit  . Elevated LFTs    likely hepatic congestion from CHF  . history Marijuana smoker   . Hypertension 10/18/2011  . Mitral valve regurgitation: Moderate to severe per 2 d echo 02/13/2017 02/13/2017  . Non-ischemic cardiomyopathy ([place]) 10/20/10   a. cath 11/22/10: normal cors; EF 15-20%;  b. echo 11/19/10: EF 20%, mild MR, mild LAE, PASP 48; c. dx 5/12, tx with IV milrinone for low output, Coxsackie B2 titer elevated; o/w HIV, CMV, RMSF, ANA, RF, ESR, ACE all normal   Past Surgical History:  Procedure Laterality Date  . heart catheterization  11/21/10   Dr.  Peter Martinique   Social History   Socioeconomic History  . Marital status: Single    Spouse name: Not on file  . Number of children: 2  . Years of education: Not on file  . Highest education level: Not on file  Occupational History  . Occupation: culinary/management  Social Needs  . Financial resource strain: Not on file  . Food insecurity:    Worry: Not on file    Inability: Not on file  . Transportation needs:    Medical: Not on file    Non-medical: Not on file  Tobacco Use  . Smoking status: Never Smoker  . Smokeless tobacco: Never  Used  Substance and Sexual Activity  . Alcohol use: No  . Drug use: Yes    Comment: occasional marijuana  . Sexual activity: Yes  Lifestyle  . Physical activity:    Days per week: Not on file    Minutes per session: Not on file  . Stress: Not on file  Relationships  . Social connections:    Talks on phone: Not on file    Gets together: Not on file    Attends religious service: Not on file    Active member of club or organization: Not on file    Attends meetings of clubs or organizations: Not on file    Relationship status: Not on file  . Intimate partner violence:    Fear of current or ex partner: Not on file    Emotionally abused: Not on file    Physically abused: Not on file    Forced sexual activity: Not on file  Other Topics Concern  . Not on file  Social History Narrative   Lives with daughter who is 25 months old and son who is 36 years old   Current Outpatient Medications on File Prior to Visit  Medication Sig Dispense Refill  . ACCU-CHEK SOFTCLIX LANCETS lancets Use as instructed 100 each 12  . Blood Glucose Monitoring Suppl (ACCU-CHEK AVIVA) device Use as instructed 1 each 0  . digoxin (LANOXIN) 0.125 MG tablet Take 1 tablet (0.125 mg total) by mouth daily. 90 tablet 1  . empagliflozin (JARDIANCE) 25 MG TABS tablet Take 25 mg by mouth daily. 30 tablet 5  . furosemide (LASIX) 40 MG tablet Take 1 tablet (40 mg total) by mouth daily. 90 tablet 3  . glipiZIDE (GLUCOTROL) 5 MG tablet TAKE 1 TABLET BY MOUTH TWICE DAILY ** NEEDS NEW DOCTOR FOR FURTHER FILLS** (Patient not taking: Reported on 04/18/2018) 60 tablet 0  . glucose blood (ACCU-CHEK AVIVA) test strip Use as instructed 2x a day 200 each 3  . metFORMIN (GLUCOPHAGE-XR) 500 MG 24 hr tablet Take 1 tablet (500 mg total) by mouth daily with breakfast. 60 tablet 5  . metoprolol succinate (TOPROL-XL) 100 MG 24 hr tablet Take 1 tablet (100 mg total) by mouth 2 (two) times daily. Take with or immediately following a meal. 180  tablet 1  . rivaroxaban (XARELTO) 20 MG TABS tablet Take 1 tablet (20 mg total) by mouth daily with supper. Crushed Xarelto can be given down a G-tube but NOT a J-Tube. Needs OV [phone] 30 tablet 0  . sacubitril-valsartan (ENTRESTO) 49-51 MG Take 1 tablet by mouth 2 (two) times daily. 60 tablet 3  . spironolactone (ALDACTONE) 25 MG tablet Take 1 tablet (25 mg total) by mouth daily. 30 tablet 3   No current facility-administered medications on file prior to visit.    No Known Allergies Family History  Problem Relation Age of Onset  . Heart disease Mother   . Heart failure Mother   . Rheumatic fever Mother   . Sleep apnea Father   . Diabetes Father   . Hearing loss Paternal Grandfather 84       CHF  . Colon polyps Unknown        Grandfather  . Sleep apnea Unknown   . Heart disease Maternal Grandmother     PE:l There were no vitals taken for this visit. Wt Readings from Last 3 Encounters:  04/18/18 216 lb (98 kg)  02/13/18 214 lb (97.1 kg)  12/27/17 216 lb 3.2 oz (98.1 kg)   Constitutional: overweight, in NAD Eyes: PERRLA, EOMI, no exophthalmos ENT: moist mucous membranes, no thyromegaly, no cervical lymphadenopathy Cardiovascular: RRR, No MRG Respiratory: CTA B Gastrointestinal: abdomen soft, NT, ND, BS+ Musculoskeletal: no deformities, strength intact in all 4 Skin: moist, warm, no rashes Neurological: no tremor with outstretched hands, DTR normal in all 4  ASSESSMENT: 1. DM2, non-insulin-dependent, uncontrolled, with complications - CHF - mild CKD  2. HL  3. Obesity  PLAN:  1. Patient with history of uncontrolled diabetes, on oral antidiabetic regimen with low dose metformin and Jardiance.  He was previously on glipizide which caused hypoglycemia and he also was on higher doses of metformin which caused diarrhea.  He has CHF so we started him on Jardiance.  I also discussed with him about improving his diet and suggested to cut down on his sweets.  At that time  he was eating a lot of candy, cake, sodas, fast food and pizza more than 3 times a week.  He refused a referral to nutrition.  However, since our first appointment, he made significant changes in his diet and greatly decreased his sweets intake.  Subsequently, his sugars improved significantly.  He was telling me at last visit that he was not taking metformin consistently as he was afraid that this may drop his sugars.  I advised him to continue metformin since this would be unlikely to decrease his sugars lower than normal.  We did not change his regimen at last visit.  - I suggested to:  Patient Instructions  Please continue: - Metformin ER 500 mg with dinner - Jardiance 25 mg daily before b'fast  Please call and schedule an eye appt with Dr. Prudencio Burly: [facility]:  Dr. Sherlyn Lick MD ?  Address: [place], [place], [place] [address]  [phone]  Please return in 4 months with your sugar log.   - today, HbA1c is 7%  - continue checking sugars at different times of the day - check 1x a day, rotating checks - advised for yearly eye exams >> he is UTD - Return to clinic in 4 mo with sugar log   2. HL -Reviewed lipid panel from 03/2018: HDL slightly low, triglycerides high, LDL at goal Lab Results  Component Value Date   CHOL 130 04/18/2018   HDL 36.50 (L) 04/18/2018   LDLCALC 54 04/18/2018   TRIG 198.0 (H) 04/18/2018   CHOLHDL 4 04/18/2018  -He is not on a statin  3. Obesity -We will continue Jardiance which should also help with weight loss -No weight loss since last visit   Philemon Kingdom, MD PhD [facility] Endocr[facility]

## 2018-08-25 ENCOUNTER — Other Ambulatory Visit (HOSPITAL_COMMUNITY): Payer: Self-pay | Admitting: Cardiology

## 2018-08-27 ENCOUNTER — Other Ambulatory Visit: Payer: Self-pay | Admitting: *Deleted

## 2018-08-27 ENCOUNTER — Other Ambulatory Visit (HOSPITAL_COMMUNITY): Payer: Self-pay

## 2018-08-27 MED ORDER — RIVAROXABAN 20 MG PO TABS: 20.0000 mg | ORAL_TABLET | Freq: Every day | ORAL | 2 refills | Status: DC

## 2018-10-09 ENCOUNTER — Other Ambulatory Visit (HOSPITAL_COMMUNITY): Payer: Self-pay | Admitting: Cardiology

## 2018-10-10 ENCOUNTER — Other Ambulatory Visit (HOSPITAL_COMMUNITY): Payer: Self-pay | Admitting: *Deleted

## 2018-10-10 MED ORDER — SACUBITRIL-VALSARTAN 49-51 MG PO TABS: 1.0000 | ORAL_TABLET | Freq: Two times a day (BID) | ORAL | 3 refills | Status: DC

## 2018-10-10 MED ORDER — DIGOXIN 125 MCG PO TABS: 0.1250 mg | ORAL_TABLET | Freq: Every day | ORAL | 1 refills | Status: DC

## 2018-10-10 MED ORDER — METOPROLOL SUCCINATE ER 100 MG PO TB24: 100.0000 mg | ORAL_TABLET | Freq: Two times a day (BID) | ORAL | 1 refills | Status: DC

## 2018-10-10 MED ORDER — SPIRONOLACTONE 25 MG PO TABS: 25.0000 mg | ORAL_TABLET | Freq: Every day | ORAL | 3 refills | Status: AC

## 2018-10-10 MED ORDER — RIVAROXABAN 20 MG PO TABS: 20.0000 mg | ORAL_TABLET | Freq: Every day | ORAL | 2 refills | Status: DC

## 2018-10-10 MED ORDER — FUROSEMIDE 40 MG PO TABS: 40.0000 mg | ORAL_TABLET | Freq: Every day | ORAL | 3 refills | Status: DC

## 2018-10-14 ENCOUNTER — Telehealth (HOSPITAL_COMMUNITY): Payer: Self-pay | Admitting: Licensed Clinical Social Worker

## 2018-10-14 ENCOUNTER — Other Ambulatory Visit (HOSPITAL_COMMUNITY): Payer: Self-pay | Admitting: *Deleted

## 2018-10-14 NOTE — Telephone Encounter (Signed)
Pt called CSW to request help with medication concern.  Pt reports that he has had trouble getting his Xarelto from pharmacy and has been told it was denied.  CSW called pharmacy who states they just don't have an active refill on file- CSW messaged clinic to have new script sent in  CSW will continue to follow and assist as needed  Burna Sis, LCSW Clinical Social Worker [facility] Desk#: [phone] Cell#: [phone]

## 2018-10-15 ENCOUNTER — Telehealth (HOSPITAL_COMMUNITY): Payer: Self-pay | Admitting: Licensed Clinical Social Worker

## 2018-10-15 ENCOUNTER — Other Ambulatory Visit (HOSPITAL_COMMUNITY): Payer: Self-pay

## 2018-10-15 MED ORDER — RIVAROXABAN 20 MG PO TABS: 20.0000 mg | ORAL_TABLET | Freq: Every day | ORAL | 2 refills | Status: DC

## 2018-10-15 NOTE — Telephone Encounter (Signed)
Refill for Xarelto sent to pharmacy.  Previous script was printed and not received by pharmacy.    Received fax from pharmacy saying that manufacturer for digoxin changed so they have to get permission to dispense medicine.  Per pharmacy, medicine is no different, only manufacturer.  Per Dr Gala Romney, ok for pharmacy to dispense.  Pharmacy aware of same.

## 2018-10-15 NOTE — Telephone Encounter (Signed)
CSW confirmed that pt Xarelto script had been resent to pt pharmacy- CSW called pharmacy and confirmed they have the script and are filling it- pt updated  CSW will continue to follow and assist as needed  Burna Sis, LCSW Clinical Social Worker [facility] Desk#: [phone] Cell#: [phone]

## 2018-11-13 ENCOUNTER — Telehealth (HOSPITAL_COMMUNITY): Payer: Self-pay | Admitting: Licensed Clinical Social Worker

## 2018-11-13 ENCOUNTER — Telehealth (HOSPITAL_COMMUNITY): Payer: Self-pay | Admitting: Cardiology

## 2018-11-13 NOTE — Telephone Encounter (Signed)
CSW received call from patient requesting an appointment with the clinic.  Pt is agreeable to a televisit or in person visit in clinic.  CSW sent message to scheduler to arrange.  CSW will continue to follow and assist as needed  Burna Sis, LCSW Clinical Social Worker [facility] Desk#: [phone] Cell#: [phone]

## 2018-11-13 NOTE — Telephone Encounter (Signed)
Patient left VM on triage line with request for appt, medication check and labs. Reports he has cramps in body   Attempted to return call No answer An voicemail is full

## 2018-11-18 NOTE — Telephone Encounter (Signed)
Pt reports message was not emergent Reports he was having difficulty getting medications refilled w/o follow up. Last ov 02/2018  followup scheduled and advised if labs are needed that can be discussed at followup

## 2018-12-05 ENCOUNTER — Other Ambulatory Visit: Payer: Self-pay

## 2018-12-09 ENCOUNTER — Ambulatory Visit (INDEPENDENT_AMBULATORY_CARE_PROVIDER_SITE_OTHER): Payer: Medicaid Other | Admitting: Internal Medicine

## 2018-12-09 ENCOUNTER — Other Ambulatory Visit: Payer: Self-pay

## 2018-12-09 ENCOUNTER — Encounter: Payer: Self-pay | Admitting: Internal Medicine

## 2018-12-09 VITALS — BP 130/80 | HR 100 | Ht 72.0 in | Wt 219.0 lb

## 2018-12-09 DIAGNOSIS — E669 Obesity, unspecified: Secondary | ICD-10-CM

## 2018-12-09 DIAGNOSIS — E785 Hyperlipidemia, unspecified: Secondary | ICD-10-CM

## 2018-12-09 DIAGNOSIS — E1159 Type 2 diabetes mellitus with other circulatory complications: Secondary | ICD-10-CM | POA: Diagnosis not present

## 2018-12-09 LAB — POCT GLYCOSYLATED HEMOGLOBIN (HGB A1C): Hemoglobin A1C: 9.1 % — AB (ref 4.0–5.6)

## 2018-12-09 MED ORDER — METFORMIN HCL ER 500 MG PO TB24: 500.0000 mg | ORAL_TABLET | Freq: Every day | ORAL | 3 refills | Status: AC

## 2018-12-09 MED ORDER — JARDIANCE 25 MG PO TABS: 25.0000 mg | ORAL_TABLET | Freq: Every day | ORAL | 11 refills | Status: AC

## 2018-12-09 NOTE — Progress Notes (Addendum)
Patient ID: Tyrone Walker, male   DOB: 1976/02/14, 43 y.o.   MRN: [id number]   HPI: Tyrone Walker is a 43 y.o.-year-old male, initially referred by his cardiologist, Dr. Haroldine Laws, for management of DM2, dx in 2013, non-insulin-dependent, uncontrolled, with complications (CHF, mild CKD).  Last visit 8 months ago. No PCP.  He ran out of his diabetic medications 4 to 5 months ago due to the fact that he did not have insurance.  He recently got his Medicaid card back.  He did not let me know about running out of the medicines so he was off these since the beginning of the year.  Sugars are higher.  Last hemoglobin A1c was: Lab Results  Component Value Date   HGBA1C 7.0 (A) 04/18/2018   HGBA1C 10.8 (H) 12/12/2017   HGBA1C 10.8 (H) 10/18/2017   HGBA1C 12.8 (H) 05/09/2017   HGBA1C 11.5 (H) 02/11/2017   HGBA1C 6.6 10/18/2011   Pt was on a regimen of: - Glipizide 5 mg 2x a day after meals  At last visit, we changed to: - Metformin ER 500 mg twice a day >> 500 mg with dinner >> stopped 4-5 mo as he lost insurance - Jardiance 25 mg daily in a.m.  >> stopped 4-5 mo  as he lost insurance  He checks sugars once a day - am: 113-120 >> 107-120 - 2h after b'fast: n/c - lunch: n/c - 2h after lunch: <150 >> n/c - dinner: n/c - 2h after dinner: 120-130 >> 200-230 - bedtime: n/c Lowest: 57 >> 68 (missed b'fast and lunch) >> 107. Highest: 500 >> 171 >> <300  Pt's meals are: - Breakfast: 2-3 eggs + grits - Lunch: 2 sandwiches - Dinner: meat + veggies + rice or other starch, pizza - Snacks: stopped sweets: cakes, sodas  -+ Mild CKD, last BUN/creatinine:  Lab Results  Component Value Date   BUN 8 02/13/2018   BUN 10 12/12/2017   CREATININE 1.18 02/13/2018   CREATININE 1.19 12/12/2017  On Entresto. -+ HL;  last set of lipids: Lab Results  Component Value Date   CHOL 130 04/18/2018   HDL 36.50 (L) 04/18/2018   LDLCALC 54 04/18/2018   TRIG 198.0 (H) 04/18/2018   CHOLHDL 4 04/18/2018   He is not on a statin. -He did not see Dr. Prudencio Burly for diabetic eye exam yet - no numbness and tingling in his feet.  Pt has FH of DM in M and F, MGM, MGF.  ROS: Constitutional: no weight gain/no weight loss, no fatigue, no subjective hyperthermia, no subjective hypothermia Eyes: no blurry vision, no xerophthalmia ENT: no sore throat, no nodules palpated in neck, no dysphagia, no odynophagia, no hoarseness Cardiovascular: no CP/no SOB/no palpitations/no leg swelling Respiratory: no cough/no SOB/no wheezing Gastrointestinal: no N/no V/no D/no C/no acid reflux Musculoskeletal: + Muscle cramps/no joint aches Skin: no rashes, no hair loss Neurological: no tremors/no numbness/no tingling/no dizziness  I reviewed pt's medications, allergies, PMH, social hx, family hx, and changes were documented in the history of present illness. Otherwise, unchanged from my initial visit note.  Past Medical History:  Diagnosis Date  . Acute on chronic systolic (congestive) heart failure ([place]) 02/11/2017  . Chronic systolic heart failure ([place])   . Colitis 10/12/10   ER visit  . Elevated LFTs    likely hepatic congestion from CHF  . history Marijuana smoker   . Hypertension 10/18/2011  . Mitral valve regurgitation: Moderate to severe per 2 d echo 02/13/2017 02/13/2017  . Non-ischemic cardiomyopathy (  [place]) 10/20/10   a. cath 11/22/10: normal cors; EF 15-20%;  b. echo 11/19/10: EF 20%, mild MR, mild LAE, PASP 48; c. dx 5/12, tx with IV milrinone for low output, Coxsackie B2 titer elevated; o/w HIV, CMV, RMSF, ANA, RF, ESR, ACE all normal   Past Surgical History:  Procedure Laterality Date  . heart catheterization  11/21/10   Dr. Peter Martinique   Social History   Socioeconomic History  . Marital status: Single    Spouse name: Not on file  . Number of children: 2  . Years of education: Not on file  . Highest education level: Not on file  Occupational History  . Occupation: culinary/management  Social Needs   . Financial resource strain: Not on file  . Food insecurity    Worry: Not on file    Inability: Not on file  . Transportation needs    Medical: Not on file    Non-medical: Not on file  Tobacco Use  . Smoking status: Never Smoker  . Smokeless tobacco: Never Used  Substance and Sexual Activity  . Alcohol use: No  . Drug use: Yes    Comment: occasional marijuana  . Sexual activity: Yes  Lifestyle  . Physical activity    Days per week: Not on file    Minutes per session: Not on file  . Stress: Not on file  Relationships  . Social Herbalist on phone: Not on file    Gets together: Not on file    Attends religious service: Not on file    Active member of club or organization: Not on file    Attends meetings of clubs or organizations: Not on file    Relationship status: Not on file  . Intimate partner violence    Fear of current or ex partner: Not on file    Emotionally abused: Not on file    Physically abused: Not on file    Forced sexual activity: Not on file  Other Topics Concern  . Not on file  Social History Narrative   Lives with daughter who is 49 months old and son who is 73 years old   Current Outpatient Medications on File Prior to Visit  Medication Sig Dispense Refill  . ACCU-CHEK SOFTCLIX LANCETS lancets Use as instructed 100 each 12  . Blood Glucose Monitoring Suppl (ACCU-CHEK AVIVA) device Use as instructed 1 each 0  . digoxin (LANOXIN) 0.125 MG tablet Take 1 tablet (0.125 mg total) by mouth daily. 90 tablet 1  . empagliflozin (JARDIANCE) 25 MG TABS tablet Take 25 mg by mouth daily. 30 tablet 5  . furosemide (LASIX) 40 MG tablet Take 1 tablet (40 mg total) by mouth daily. 90 tablet 3  . glipiZIDE (GLUCOTROL) 5 MG tablet TAKE 1 TABLET BY MOUTH TWICE DAILY ** NEEDS NEW DOCTOR FOR FURTHER FILLS** (Patient not taking: Reported on 04/18/2018) 60 tablet 0  . glucose blood (ACCU-CHEK AVIVA) test strip Use as instructed 2x a day 200 each 3  . metFORMIN  (GLUCOPHAGE-XR) 500 MG 24 hr tablet Take 1 tablet (500 mg total) by mouth daily with breakfast. 60 tablet 5  . metoprolol succinate (TOPROL-XL) 100 MG 24 hr tablet Take 1 tablet (100 mg total) by mouth 2 (two) times daily. Take with or immediately following a meal. 180 tablet 1  . rivaroxaban (XARELTO) 20 MG TABS tablet Take 1 tablet (20 mg total) by mouth daily with supper. Crushed Xarelto can be given down a G-tube but  NOT a J-Tube. 30 tablet 2  . sacubitril-valsartan (ENTRESTO) 49-51 MG Take 1 tablet by mouth 2 (two) times daily. 60 tablet 3  . spironolactone (ALDACTONE) 25 MG tablet Take 1 tablet (25 mg total) by mouth daily. 30 tablet 3   No current facility-administered medications on file prior to visit.    No Known Allergies Family History  Problem Relation Age of Onset  . Heart disease Mother   . Heart failure Mother   . Rheumatic fever Mother   . Sleep apnea Father   . Diabetes Father   . Hearing loss Paternal Grandfather 41       CHF  . Colon polyps Unknown        Grandfather  . Sleep apnea Unknown   . Heart disease Maternal Grandmother    PE: BP 130/80   Pulse 100   Ht 6' (1.829 m)   Wt 219 lb (99.3 kg)   SpO2 98%   BMI 29.70 kg/m  Wt Readings from Last 3 Encounters:  12/09/18 219 lb (99.3 kg)  04/18/18 216 lb (98 kg)  02/13/18 214 lb (97.1 kg)   Constitutional: overweight, in NAD Eyes: PERRLA, EOMI, no exophthalmos ENT: moist mucous membranes, no thyromegaly, no cervical lymphadenopathy Cardiovascular: tachycardia, RR, No MRG Respiratory: CTA B Gastrointestinal: abdomen soft, NT, ND, BS+ Musculoskeletal: no deformities, strength intact in all 4 Skin: moist, warm, no rashes Neurological: no tremor with outstretched hands, DTR normal in all 4  ASSESSMENT: 1. DM2, non-insulin-dependent, uncontrolled, with complications - CHF - mild CKD  2. HL  3. Obesity  PLAN:  1. Patient with history of uncontrolled diabetes, on oral antidiabetic regimen with  metformin and SGLT2 inhibitor.  He was previously on glipizide but developed hypoglycemia so we changed to Jardiance especially in the HF.  He was on the higher dose of metformin ER, but he had diarrhea with this.  At our last visit, we discussed at length about improving his diet and I suggested to cut down on his sweets (candy, cake, soda).  He was also eating fast food with pizza more than 3 times a week and I advised him to decrease the frequency of eating such high calorie foods.  He refused a referral to nutrition.  Since then, he did make changes in his diet, by cutting down sweets and his sugars improved significantly.  At last visit was not taking metformin consider for fear of low blood sugars but I encouraged him to do so since metformin is not known to cause hypoglycemia. -Specially the second half of the day as he is off his diabetic medicines.  I advised him to always let me know if this happens to see how we can help him.  As of now, he got a new insurance card and he cannot refill his medicines.  He just refilled his test strips, also.  I strongly advised him to start these right away. - I suggested to:  Patient Instructions  Please restart: - Metformin ER 500 mg with dinner  - Jardiance 25 mg daily before b'fast  Please call and schedule an eye appt with Dr. Prudencio Burly: [facility]:  Dr. Sherlyn Lick MD ?  Address: [address], [place], [place] [address]  [phone]  Please return in 3 months with your sugar log.   - today, HbA1c is 9.1% (higher) - continue checking sugars at different times of the day - check 1x a day, rotating checks - advised for yearly eye exams >>  he is not UTD - Return to clinic in 3 mo with sugar log    2. HL - Reviewed latest lipid panel from 03/2018: Triglycerides high, but nonfasting sample, HDL low, LDL at goal Lab Results  Component Value Date   CHOL 130 04/18/2018   HDL 36.50 (L) 04/18/2018   LDLCALC 54 04/18/2018    TRIG 198.0 (H) 04/18/2018   CHOLHDL 4 04/18/2018  -He is not on a statin  3. Obesity -restart Jardiance which should also help with weight loss -No significant weight loss since last visit, gained 3 lbs  Office Visit on 12/09/2018  Component Date Value Ref Range Status  . Glucose, Bld 12/09/2018 218* 65 - 99 mg/dL Final   Comment: .            Fasting reference interval . For someone without known diabetes, a glucose value >125 mg/dL indicates that they may have diabetes and this should be confirmed with a follow-up test. .   . BUN 12/09/2018 9  7 - 25 mg/dL Final  . Creat 12/09/2018 1.17  0.60 - 1.35 mg/dL Final  . GFR, Est Non African American 12/09/2018 75  > OR = 60 mL/min/1.68m Final  . GFR, Est African American 12/09/2018 87  > OR = 60 mL/min/1.712mFinal  . BUN/Creatinine Ratio 0629/93/7169OT APPLICABLE  6 - 22 (calc) Final  . Sodium 12/09/2018 139  135 - 146 mmol/L Final  . Potassium 12/09/2018 4.2  3.5 - 5.3 mmol/L Final  . Chloride 12/09/2018 101  98 - 110 mmol/L Final  . CO2 12/09/2018 27  20 - 32 mmol/L Final  . Calcium 12/09/2018 9.5  8.6 - 10.3 mg/dL Final  . Total Protein 12/09/2018 7.2  6.1 - 8.1 g/dL Final  . Albumin 12/09/2018 4.2  3.6 - 5.1 g/dL Final  . Globulin 12/09/2018 3.0  1.9 - 3.7 g/dL (calc) Final  . AG Ratio 12/09/2018 1.4  1.0 - 2.5 (calc) Final  . Total Bilirubin 12/09/2018 0.8  0.2 - 1.2 mg/dL Final  . Alkaline phosphatase (APISO) 12/09/2018 48  36 - 130 U/L Final  . AST 12/09/2018 16  10 - 40 U/L Final  . ALT 12/09/2018 12  9 - 46 U/L Final  . Hemoglobin A1C 12/09/2018 9.1* 4.0 - 5.6 % Final    Labs are good with the exception of the high glucose.  Kidney function is stable. CrPhilemon KingdomMD PhD [facility]

## 2018-12-09 NOTE — Addendum Note (Signed)
Addended by: Cardell Peach I on: 12/09/2018 09:47 AM   Modules accepted: Orders

## 2018-12-09 NOTE — Patient Instructions (Addendum)
Please restart: - Metformin ER 500 mg with dinner  - Jardiance 25 mg daily before b'fast  Please call and schedule an eye appt with Dr. Prudencio Burly: [facility]:  Dr. Sherlyn Lick MD ?  Address: [address], [place], [place] [address]  [phone]  Please return in 3 months with your sugar log.

## 2018-12-10 LAB — COMPLETE METABOLIC PANEL WITH GFR
AG Ratio: 1.4 (calc) (ref 1.0–2.5)
ALT: 12 U/L (ref 9–46)
AST: 16 U/L (ref 10–40)
Albumin: 4.2 g/dL (ref 3.6–5.1)
Alkaline phosphatase (APISO): 48 U/L (ref 36–130)
BUN: 9 mg/dL (ref 7–25)
CO2: 27 mmol/L (ref 20–32)
Calcium: 9.5 mg/dL (ref 8.6–10.3)
Chloride: 101 mmol/L (ref 98–110)
Creat: 1.17 mg/dL (ref 0.60–1.35)
GFR, Est African American: 87 mL/min/{1.73_m2} (ref >=60)
GFR, Est Non African American: 75 mL/min/{1.73_m2} (ref >=60)
Globulin: 3 g/dL (calc) (ref 1.9–3.7)
Glucose, Bld: 218 mg/dL — ABNORMAL HIGH (ref 65–99)
Potassium: 4.2 mmol/L (ref 3.5–5.3)
Sodium: 139 mmol/L (ref 135–146)
Total Bilirubin: 0.8 mg/dL (ref 0.2–1.2)
Total Protein: 7.2 g/dL (ref 6.1–8.1)

## 2018-12-12 ENCOUNTER — Encounter (HOSPITAL_COMMUNITY): Payer: Medicaid Other

## 2019-03-31 ENCOUNTER — Ambulatory Visit (INDEPENDENT_AMBULATORY_CARE_PROVIDER_SITE_OTHER): Payer: Medicaid Other | Admitting: Internal Medicine

## 2019-03-31 DIAGNOSIS — E669 Obesity, unspecified: Secondary | ICD-10-CM

## 2019-03-31 DIAGNOSIS — E785 Hyperlipidemia, unspecified: Secondary | ICD-10-CM

## 2019-03-31 DIAGNOSIS — E1159 Type 2 diabetes mellitus with other circulatory complications: Secondary | ICD-10-CM

## 2019-03-31 NOTE — Patient Instructions (Signed)
Please continue: - Metformin ER 500 mg with dinner - Jardiance 25 mg daily before b'fast  Please call and schedule an eye appt with Dr. Prudencio Burly: [facility]:  Dr. Sherlyn Lick MD ?  Address: [address], [place], [place] [address]  [phone]  Please return in 3-4 months with your sugar log.

## 2019-03-31 NOTE — Progress Notes (Signed)
NO SHOW  Patient ID: Tyrone Walker, male   DOB: 1975-08-12, 43 y.o.   MRN: [id number]   HPI: Tyrone Walker is a 43 y.o.-year-old male, initially referred by his cardiologist, Dr. Haroldine Laws, for management of DM2, dx in 2013, non-insulin-dependent, uncontrolled, with complications (CHF, mild CKD).  Last visit 4 months ago.  Reviewed HbA1c levels: Lab Results  Component Value Date   HGBA1C 9.1 (A) 12/09/2018   HGBA1C 7.0 (A) 04/18/2018   HGBA1C 10.8 (H) 12/12/2017   HGBA1C 10.8 (H) 10/18/2017   HGBA1C 12.8 (H) 05/09/2017   HGBA1C 11.5 (H) 02/11/2017   HGBA1C 6.6 10/18/2011   Pt is on a regimen of: (Both restarted at last visit after he was off for 4 to 5 months due to lack of insurance-now back on medication): - Metformin ER 500 mg with dinner - Jardiance 25 mg daily before b'fast He was previously on glipizide.  He checks sugars once a day: - am: 113-120 >> 107-120 - 2h after b'fast: n/c - lunch: n/c - 2h after lunch: <150 >> n/c - dinner: n/c - 2h after dinner: 120-130 >> 200-230 - bedtime: n/c Lowest: 57 >> 68 (missed b'fast and lunch) >> 107. Highest: 500 >> 171 >> <300  Pt's meals are: - Breakfast: 2-3 eggs + grits - Lunch: 2 sandwiches - Dinner: meat + veggies + rice or other starch, pizza - Snacks: stopped sweets: cakes, sodas  -+ Mild CKD, last BUN/creatinine:  Lab Results  Component Value Date   BUN 9 12/09/2018   BUN 8 02/13/2018   CREATININE 1.17 12/09/2018   CREATININE 1.18 02/13/2018  On Entresto. -+ HL;  last set of lipids: Lab Results  Component Value Date   CHOL 130 04/18/2018   HDL 36.50 (L) 04/18/2018   LDLCALC 54 04/18/2018   TRIG 198.0 (H) 04/18/2018   CHOLHDL 4 04/18/2018  Not on a statin. -He did not see Dr. Prudencio Burly for diabetic eye exam yet -He denies numbness and tingling in his feet.  Pt has FH of DM in M and F, MGM, MGF.  ROS: Constitutional: no weight gain/no weight loss, no fatigue, no subjective hyperthermia, no subjective  hypothermia Eyes: no blurry vision, no xerophthalmia ENT: no sore throat, no nodules palpated in neck, no dysphagia, no odynophagia, no hoarseness Cardiovascular: no CP/no SOB/no palpitations/no leg swelling Respiratory: no cough/no SOB/no wheezing Gastrointestinal: no N/no V/no D/no C/no acid reflux Musculoskeletal: no muscle aches/no joint aches Skin: no rashes, no hair loss Neurological: no tremors/no numbness/no tingling/no dizziness  I reviewed pt's medications, allergies, PMH, social hx, family hx, and changes were documented in the history of present illness. Otherwise, unchanged from my initial visit note.  Past Medical History:  Diagnosis Date  . Acute on chronic systolic (congestive) heart failure ([place]) 02/11/2017  . Chronic systolic heart failure ([place])   . Colitis 10/12/10   ER visit  . Elevated LFTs    likely hepatic congestion from CHF  . history Marijuana smoker   . Hypertension 10/18/2011  . Mitral valve regurgitation: Moderate to severe per 2 d echo 02/13/2017 02/13/2017  . Non-ischemic cardiomyopathy ([place]) 10/20/10   a. cath 11/22/10: normal cors; EF 15-20%;  b. echo 11/19/10: EF 20%, mild MR, mild LAE, PASP 48; c. dx 5/12, tx with IV milrinone for low output, Coxsackie B2 titer elevated; o/w HIV, CMV, RMSF, ANA, RF, ESR, ACE all normal   Past Surgical History:  Procedure Laterality Date  . heart catheterization  11/21/10   Dr. Peter Martinique  Social History   Socioeconomic History  . Marital status: Single    Spouse name: Not on file  . Number of children: 2  . Years of education: Not on file  . Highest education level: Not on file  Occupational History  . Occupation: culinary/management  Social Needs  . Financial resource strain: Not on file  . Food insecurity    Worry: Not on file    Inability: Not on file  . Transportation needs    Medical: Not on file    Non-medical: Not on file  Tobacco Use  . Smoking status: Never Smoker  . Smokeless tobacco: Never  Used  Substance and Sexual Activity  . Alcohol use: No  . Drug use: Yes    Comment: occasional marijuana  . Sexual activity: Yes  Lifestyle  . Physical activity    Days per week: Not on file    Minutes per session: Not on file  . Stress: Not on file  Relationships  . Social Herbalist on phone: Not on file    Gets together: Not on file    Attends religious service: Not on file    Active member of club or organization: Not on file    Attends meetings of clubs or organizations: Not on file    Relationship status: Not on file  . Intimate partner violence    Fear of current or ex partner: Not on file    Emotionally abused: Not on file    Physically abused: Not on file    Forced sexual activity: Not on file  Other Topics Concern  . Not on file  Social History Narrative   Lives with daughter who is 34 months old and son who is 48 years old   Current Outpatient Medications on File Prior to Visit  Medication Sig Dispense Refill  . ACCU-CHEK SOFTCLIX LANCETS lancets Use as instructed 100 each 12  . digoxin (LANOXIN) 0.125 MG tablet Take 1 tablet (0.125 mg total) by mouth daily. 90 tablet 1  . empagliflozin (JARDIANCE) 25 MG TABS tablet Take 25 mg by mouth daily. 30 tablet 11  . furosemide (LASIX) 40 MG tablet Take 1 tablet (40 mg total) by mouth daily. 90 tablet 3  . glipiZIDE (GLUCOTROL) 5 MG tablet TAKE 1 TABLET BY MOUTH TWICE DAILY ** NEEDS NEW DOCTOR FOR FURTHER FILLS** (Patient not taking: Reported on 12/09/2018) 60 tablet 0  . glucose blood (ACCU-CHEK AVIVA) test strip Use as instructed 2x a day 200 each 3  . metFORMIN (GLUCOPHAGE-XR) 500 MG 24 hr tablet Take 1 tablet (500 mg total) by mouth daily with breakfast. 90 tablet 3  . metoprolol succinate (TOPROL-XL) 100 MG 24 hr tablet Take 1 tablet (100 mg total) by mouth 2 (two) times daily. Take with or immediately following a meal. 180 tablet 1  . rivaroxaban (XARELTO) 20 MG TABS tablet Take 1 tablet (20 mg total) by mouth  daily with supper. Crushed Xarelto can be given down a G-tube but NOT a J-Tube. 30 tablet 2  . sacubitril-valsartan (ENTRESTO) 49-51 MG Take 1 tablet by mouth 2 (two) times daily. 60 tablet 3  . spironolactone (ALDACTONE) 25 MG tablet Take 1 tablet (25 mg total) by mouth daily. 30 tablet 3   No current facility-administered medications on file prior to visit.    No Known Allergies Family History  Problem Relation Age of Onset  . Heart disease Mother   . Heart failure Mother   . Rheumatic fever  Mother   . Sleep apnea Father   . Diabetes Father   . Hearing loss Paternal Grandfather 45       CHF  . Colon polyps Unknown        Grandfather  . Sleep apnea Unknown   . Heart disease Maternal Grandmother    PE: There were no vitals taken for this visit. Wt Readings from Last 3 Encounters:  12/09/18 219 lb (99.3 kg)  04/18/18 216 lb (98 kg)  02/13/18 214 lb (97.1 kg)   Constitutional: overweight, in NAD Eyes: PERRLA, EOMI, no exophthalmos ENT: moist mucous membranes, no thyromegaly, no cervical lymphadenopathy Cardiovascular: RRR, No MRG Respiratory: CTA B Gastrointestinal: abdomen soft, NT, ND, BS+ Musculoskeletal: no deformities, strength intact in all 4 Skin: moist, warm, no rashes Neurological: no tremor with outstretched hands, DTR normal in all 4  ASSESSMENT: 1. DM2, non-insulin-dependent, uncontrolled, with complications - CHF - mild CKD  2. HL  3. Obesity  PLAN:  1. Patient with history of uncontrolled diabetes, on oral antidiabetic regimen with metformin and SGLT 2 inhibitor.  He was previously on glipizide but developed hypoglycemia, so we changed to Jardiance, especially in the setting of heart failure.  He had diarrhea with higher doses of metformin ER.  We discussed at length in the past about improving his diet by cutting down on his sweets (candy, cakes, soda) and reducing fast foods, and I also referred him to nutrition.  He did improve his diet after this and  sugars improved significantly.  However, at last visit, he was off his medications for 4 to 5 months after losing insurance.  He is now back on Medicaid and continues on metformin and Jardiance.  He tolerates this well.  - I suggested to:  Patient Instructions  Please continue: - Metformin ER 500 mg with dinner - Jardiance 25 mg daily before b'fast  Please call and schedule an eye appt with Dr. Prudencio Burly: [facility]:  Dr. Sherlyn Lick MD ?  Address: [address], [place], [place] [address]  [phone]  Please return in 3-4 months with your sugar log.   - we checked his HbA1c: 7%  - advised to check sugars at different times of the day - 1x a day, rotating check times - advised for yearly eye exams >> he is UTD - return to clinic in 3-4 months   2. HL -Reviewed latest lipid panel from 03/2018: LDL at goal, triglycerides slightly high (but not fasting), HDL low: Lab Results  Component Value Date   CHOL 130 04/18/2018   HDL 36.50 (L) 04/18/2018   LDLCALC 54 04/18/2018   TRIG 198.0 (H) 04/18/2018   CHOLHDL 4 04/18/2018  -He is not on a statin  3. Obesity -Continue SGLT 2 inhibitor which should also help with weight loss -No significant weight loss since last visit   Philemon Kingdom, MD PhD [facility]

## 2019-04-08 ENCOUNTER — Telehealth (HOSPITAL_COMMUNITY): Payer: Self-pay

## 2019-04-08 NOTE — Telephone Encounter (Signed)
Pt called asking for an appointment.  Pt has no current issues however his primary recommended him making an appointment.  appt given for Nov.  Pt appreciative.

## 2019-04-09 ENCOUNTER — Encounter: Payer: Self-pay | Admitting: Internal Medicine

## 2019-05-07 ENCOUNTER — Inpatient Hospital Stay (HOSPITAL_COMMUNITY): Admission: RE | Admit: 2019-05-07 | Payer: Medicaid Other | Source: Ambulatory Visit | Admitting: Internal Medicine

## 2019-05-16 ENCOUNTER — Other Ambulatory Visit: Payer: Self-pay

## 2019-05-16 ENCOUNTER — Inpatient Hospital Stay (HOSPITAL_COMMUNITY)
Admission: EM | Admit: 2019-05-16 | Discharge: 2019-05-19 | DRG: 287 | Disposition: A | Discharge status: Home Health | Payer: Medicaid Other | Attending: Cardiovascular Disease | Admitting: Cardiovascular Disease

## 2019-05-16 ENCOUNTER — Emergency Department (HOSPITAL_COMMUNITY): Payer: Medicaid Other

## 2019-05-16 ENCOUNTER — Encounter (HOSPITAL_COMMUNITY): Payer: Self-pay | Admitting: Emergency Medicine

## 2019-05-16 DIAGNOSIS — R0602 Shortness of breath: Secondary | ICD-10-CM

## 2019-05-16 DIAGNOSIS — I5043 Acute on chronic combined systolic (congestive) and diastolic (congestive) heart failure: Secondary | ICD-10-CM | POA: Diagnosis present

## 2019-05-16 DIAGNOSIS — Z9114 Patient's other noncompliance with medication regimen: Secondary | ICD-10-CM | POA: Diagnosis not present

## 2019-05-16 DIAGNOSIS — Z6827 Body mass index (BMI) 27.0-27.9, adult: Secondary | ICD-10-CM

## 2019-05-16 DIAGNOSIS — E669 Obesity, unspecified: Secondary | ICD-10-CM | POA: Diagnosis present

## 2019-05-16 DIAGNOSIS — Z7901 Long term (current) use of anticoagulants: Secondary | ICD-10-CM | POA: Diagnosis not present

## 2019-05-16 DIAGNOSIS — K920 Hematemesis: Secondary | ICD-10-CM | POA: Diagnosis present

## 2019-05-16 DIAGNOSIS — Z79899 Other long term (current) drug therapy: Secondary | ICD-10-CM | POA: Diagnosis not present

## 2019-05-16 DIAGNOSIS — Z20828 Contact with and (suspected) exposure to other viral communicable diseases: Secondary | ICD-10-CM | POA: Diagnosis present

## 2019-05-16 DIAGNOSIS — E1169 Type 2 diabetes mellitus with other specified complication: Secondary | ICD-10-CM

## 2019-05-16 DIAGNOSIS — Z7984 Long term (current) use of oral hypoglycemic drugs: Secondary | ICD-10-CM | POA: Diagnosis not present

## 2019-05-16 DIAGNOSIS — G4733 Obstructive sleep apnea (adult) (pediatric): Secondary | ICD-10-CM | POA: Diagnosis present

## 2019-05-16 DIAGNOSIS — I110 Hypertensive heart disease with heart failure: Secondary | ICD-10-CM | POA: Diagnosis present

## 2019-05-16 DIAGNOSIS — I340 Nonrheumatic mitral (valve) insufficiency: Secondary | ICD-10-CM | POA: Diagnosis not present

## 2019-05-16 DIAGNOSIS — I480 Paroxysmal atrial fibrillation: Secondary | ICD-10-CM | POA: Diagnosis present

## 2019-05-16 DIAGNOSIS — I5023 Acute on chronic systolic (congestive) heart failure: Secondary | ICD-10-CM | POA: Diagnosis not present

## 2019-05-16 DIAGNOSIS — E785 Hyperlipidemia, unspecified: Secondary | ICD-10-CM | POA: Diagnosis present

## 2019-05-16 DIAGNOSIS — I428 Other cardiomyopathies: Secondary | ICD-10-CM | POA: Diagnosis present

## 2019-05-16 DIAGNOSIS — Z833 Family history of diabetes mellitus: Secondary | ICD-10-CM

## 2019-05-16 DIAGNOSIS — I10 Essential (primary) hypertension: Secondary | ICD-10-CM | POA: Diagnosis not present

## 2019-05-16 DIAGNOSIS — I5082 Biventricular heart failure: Secondary | ICD-10-CM | POA: Diagnosis present

## 2019-05-16 DIAGNOSIS — I509 Heart failure, unspecified: Secondary | ICD-10-CM

## 2019-05-16 DIAGNOSIS — Z8249 Family history of ischemic heart disease and other diseases of the circulatory system: Secondary | ICD-10-CM | POA: Diagnosis not present

## 2019-05-16 DIAGNOSIS — F1290 Cannabis use, unspecified, uncomplicated: Secondary | ICD-10-CM | POA: Diagnosis present

## 2019-05-16 DIAGNOSIS — E119 Type 2 diabetes mellitus without complications: Secondary | ICD-10-CM | POA: Diagnosis present

## 2019-05-16 HISTORY — DX: Type 2 diabetes mellitus without complications: E11.9

## 2019-05-16 LAB — CBC WITH DIFFERENTIAL/PLATELET
Abs Immature Granulocytes: 0.01 10*3/uL (ref 0.00–0.07)
Basophils Absolute: 0 10*3/uL (ref 0.0–0.1)
Basophils Relative: 0 %
Eosinophils Absolute: 0 10*3/uL (ref 0.0–0.5)
Eosinophils Relative: 1 %
HCT: 43.5 % (ref 39.0–52.0)
Hemoglobin: 14.2 g/dL (ref 13.0–17.0)
Immature Granulocytes: 0 %
Lymphocytes Relative: 23 %
Lymphs Abs: 1.5 10*3/uL (ref 0.7–4.0)
MCH: 29.6 pg (ref 26.0–34.0)
MCHC: 32.6 g/dL (ref 30.0–36.0)
MCV: 90.6 fL (ref 80.0–100.0)
Monocytes Absolute: 0.6 10*3/uL (ref 0.1–1.0)
Monocytes Relative: 10 %
Neutro Abs: 4.2 10*3/uL (ref 1.7–7.7)
Neutrophils Relative %: 66 %
Platelets: 215 10*3/uL (ref 150–400)
RBC: 4.8 MIL/uL (ref 4.22–5.81)
RDW: 13.4 % (ref 11.5–15.5)
WBC: 6.4 10*3/uL (ref 4.0–10.5)
nRBC: 0 % (ref 0.0–0.2)

## 2019-05-16 LAB — COMPREHENSIVE METABOLIC PANEL
ALT: 34 U/L (ref 0–44)
AST: 32 U/L (ref 15–41)
Albumin: 4 g/dL (ref 3.5–5.0)
Alkaline Phosphatase: 44 U/L (ref 38–126)
Anion gap: 11 (ref 5–15)
BUN: 10 mg/dL (ref 6–20)
CO2: 25 mmol/L (ref 22–32)
Calcium: 9.3 mg/dL (ref 8.9–10.3)
Chloride: 104 mmol/L (ref 98–111)
Creatinine, Ser: 1.23 mg/dL (ref 0.61–1.24)
GFR calc Af Amer: >60 mL/min (ref >60)
GFR calc non Af Amer: >60 mL/min (ref >60)
Glucose, Bld: 137 mg/dL — ABNORMAL HIGH (ref 70–99)
Potassium: 4.5 mmol/L (ref 3.5–5.1)
Sodium: 140 mmol/L (ref 135–145)
Total Bilirubin: 1.6 mg/dL — ABNORMAL HIGH (ref 0.3–1.2)
Total Protein: 7.2 g/dL (ref 6.5–8.1)

## 2019-05-16 LAB — URINALYSIS, ROUTINE W REFLEX MICROSCOPIC
Bilirubin Urine: NEGATIVE
Glucose, UA: NEGATIVE mg/dL
Hgb urine dipstick: NEGATIVE
Ketones, ur: 20 mg/dL — AB
Leukocytes,Ua: NEGATIVE
Nitrite: NEGATIVE
Protein, ur: 100 mg/dL — AB
Specific Gravity, Urine: 1.03 (ref 1.005–1.030)
pH: 5 (ref 5.0–8.0)

## 2019-05-16 LAB — ABO/RH: ABO/RH(D): O POS

## 2019-05-16 LAB — DIGOXIN LEVEL
Digoxin Level: <0.2 ng/mL — ABNORMAL LOW (ref 0.8–2.0)
Digoxin Level: <0.2 ng/mL — ABNORMAL LOW (ref 0.8–2.0)

## 2019-05-16 LAB — GLUCOSE, CAPILLARY: Glucose-Capillary: 217 mg/dL — ABNORMAL HIGH (ref 70–99)

## 2019-05-16 LAB — CBG MONITORING, ED: Glucose-Capillary: 110 mg/dL — ABNORMAL HIGH (ref 70–99)

## 2019-05-16 LAB — TROPONIN I (HIGH SENSITIVITY)
Troponin I (High Sensitivity): 33 ng/L — ABNORMAL HIGH (ref <18)
Troponin I (High Sensitivity): 31 ng/L — ABNORMAL HIGH (ref <18)

## 2019-05-16 LAB — PROTIME-INR
INR: 1.2 (ref 0.8–1.2)
Prothrombin Time: 14.9 seconds (ref 11.4–15.2)

## 2019-05-16 LAB — TYPE AND SCREEN
ABO/RH(D): O POS
Antibody Screen: NEGATIVE

## 2019-05-16 LAB — HEMOGLOBIN A1C
Hgb A1c MFr Bld: 7.2 % — ABNORMAL HIGH (ref 4.8–5.6)
Mean Plasma Glucose: 159.94 mg/dL

## 2019-05-16 LAB — LIPASE, BLOOD: Lipase: 22 U/L (ref 11–51)

## 2019-05-16 LAB — POC SARS CORONAVIRUS 2 AG -  ED: SARS Coronavirus 2 Ag: NEGATIVE

## 2019-05-16 LAB — BRAIN NATRIURETIC PEPTIDE: B Natriuretic Peptide: 862.4 pg/mL — ABNORMAL HIGH (ref 0.0–100.0)

## 2019-05-16 LAB — SARS CORONAVIRUS 2 BY RT PCR (HOSPITAL ORDER, PERFORMED IN ~~LOC~~ HOSPITAL LAB): SARS Coronavirus 2: NEGATIVE

## 2019-05-16 MED ORDER — SPIRONOLACTONE 25 MG PO TABS
25.0000 mg | ORAL_TABLET | Freq: Every day | ORAL | Status: DC
  Administered 2019-05-16 – 2019-05-19 (×4): 25 mg via ORAL
  Filled 2019-05-16 (×4): qty 1

## 2019-05-16 MED ORDER — ACETAMINOPHEN 325 MG PO TABS
650.0000 mg | ORAL_TABLET | Freq: Four times a day (QID) | ORAL | Status: DC | PRN
  Administered 2019-05-16 – 2019-05-18 (×3): 650 mg via ORAL
  Filled 2019-05-16 (×3): qty 2

## 2019-05-16 MED ORDER — FUROSEMIDE 10 MG/ML IJ SOLN
40.0000 mg | Freq: Two times a day (BID) | INTRAMUSCULAR | Status: DC
  Administered 2019-05-16 – 2019-05-17 (×2): 40 mg via INTRAVENOUS
  Filled 2019-05-16 (×2): qty 4

## 2019-05-16 MED ORDER — GLIPIZIDE 5 MG PO TABS: 5.0000 mg | ORAL_TABLET | Freq: Two times a day (BID) | ORAL | Status: DC

## 2019-05-16 MED ORDER — CANAGLIFLOZIN 100 MG PO TABS
100.0000 mg | ORAL_TABLET | Freq: Every day | ORAL | Status: DC
  Administered 2019-05-17 – 2019-05-19 (×3): 100 mg via ORAL
  Filled 2019-05-16 (×4): qty 1

## 2019-05-16 MED ORDER — INSULIN ASPART 100 UNIT/ML ~~LOC~~ SOLN
0.0000 [IU] | Freq: Three times a day (TID) | SUBCUTANEOUS | Status: DC
  Administered 2019-05-17: 13:00:00 3 [IU] via SUBCUTANEOUS
  Administered 2019-05-17: 5 [IU] via SUBCUTANEOUS
  Administered 2019-05-18: 3 [IU] via SUBCUTANEOUS
  Administered 2019-05-18: 2 [IU] via SUBCUTANEOUS
  Administered 2019-05-18: 17:00:00 3 [IU] via SUBCUTANEOUS
  Administered 2019-05-19: 8 [IU] via SUBCUTANEOUS

## 2019-05-16 MED ORDER — RIVAROXABAN 20 MG PO TABS
20.0000 mg | ORAL_TABLET | Freq: Every day | ORAL | Status: DC
  Administered 2019-05-16 – 2019-05-17 (×2): 20 mg via ORAL
  Filled 2019-05-16 (×2): qty 1

## 2019-05-16 MED ORDER — METOPROLOL SUCCINATE ER 100 MG PO TB24
100.0000 mg | ORAL_TABLET | Freq: Two times a day (BID) | ORAL | Status: DC
  Administered 2019-05-16 – 2019-05-17 (×3): 100 mg via ORAL
  Filled 2019-05-16 (×4): qty 1

## 2019-05-16 MED ORDER — DIGOXIN 125 MCG PO TABS
0.1250 mg | ORAL_TABLET | Freq: Every day | ORAL | Status: DC
  Administered 2019-05-16 – 2019-05-19 (×4): 0.125 mg via ORAL
  Filled 2019-05-16 (×4): qty 1

## 2019-05-16 MED ORDER — ISOSORB DINITRATE-HYDRALAZINE 20-37.5 MG PO TABS
1.0000 | ORAL_TABLET | Freq: Three times a day (TID) | ORAL | Status: DC
  Administered 2019-05-16 – 2019-05-17 (×5): 1 via ORAL
  Filled 2019-05-16 (×7): qty 1

## 2019-05-16 MED ORDER — SACUBITRIL-VALSARTAN 49-51 MG PO TABS
1.0000 | ORAL_TABLET | Freq: Two times a day (BID) | ORAL | Status: DC
  Administered 2019-05-16 – 2019-05-19 (×6): 1 via ORAL
  Filled 2019-05-16 (×7): qty 1

## 2019-05-16 NOTE — ED Provider Notes (Signed)
[facility] EMERGENCY DEPARTMENT Provider Note   CSN: [id number] Arrival date & time: 05/16/19  1045     History   Chief Complaint Chief Complaint  Patient presents with  . Shortness of Breath    HPI Tyrone Walker is a 43 y.o. male Sunday you felt nauseated and had vomiting everyday this week, palpitation, chest pressure, and shortness of breath. All of these symptoms started on Friday. He states that he is taking his medications as prescribed. Patient does have a history of CHF and presents with abdominal bloating. Patient does not use oxygen at home, but is in the process of getting a CPAP at home for sleep apnea. Patient is sating at 97% on RA Patient believes that his symptoms are due to his Afib, but has never felt this poorly before. Patient states that his dry weight is 207 and is up to 216 today. He also central chest pressure the improves with leaning forward, non-pleuritic. Patient has not been able to keep food down and has had up to 7 episodes of bright red hematemesis over the last week. Patient states that he had a small volume & hard BM this morning, but otherwise has had trouble with constipation this week with at least 3 dark stools, no bright red blood. He admits to diffuse abdominal pain that is relieved by pushing on it. Patient see Dr. Haroldine Laws who wanted to pin in an ICD, but patient never followed up.     HPI  Past Medical History:  Diagnosis Date  . Acute on chronic systolic (congestive) heart failure ([place]) 02/11/2017  . Atrial fibrillation ([place]) 2011  . Chronic systolic heart failure ([place])   . Colitis 10/12/10   ER visit  . Diabetes mellitus without complication ([place])   . Elevated LFTs    likely hepatic congestion from CHF  . history Marijuana smoker   . Hypertension 10/18/2011  . Mitral valve regurgitation: Moderate to severe per 2 d echo 02/13/2017 02/13/2017  . Non-ischemic cardiomyopathy ([place]) 10/20/10   a. cath 11/22/10: normal cors; EF  15-20%;  b. echo 11/19/10: EF 20%, mild MR, mild LAE, PASP 48; c. dx 5/12, tx with IV milrinone for low output, Coxsackie B2 titer elevated; o/w HIV, CMV, RMSF, ANA, RF, ESR, ACE all normal    Patient Active Problem List   Diagnosis Date Noted  . Dyslipidemia 04/18/2018  . Obesity 04/18/2018  . Acute on chronic diastolic (congestive) heart failure ([place]) 02/13/2017  . Mitral valve regurgitation: Moderate to severe per 2 d echo 02/13/2017 02/13/2017  . Acute on chronic combined systolic and diastolic CHF (congestive heart failure) ([place]) 02/11/2017  . AKI (acute kidney injury) ([place]) 02/11/2017  . Acute bacterial sinusitis 03/06/2012  . 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 Molluscum contagiosum 10/18/2011  . Dyshidrotic eczema 10/18/2011  . Hypertension 10/18/2011  . Allergic rhinitis 10/09/2011  . OSA (obstructive sleep apnea) 02/16/2011  . Blurred vision, bilateral 12/05/2010  . Chronic systolic heart failure ([place])   . Nonspecific (abnormal) findings on radiological and other examination of abdominal area, including retroperitoneum 11/17/2010  . Non-ischemic cardiomyopathy ([place]) 10/20/2010    Past Surgical History:  Procedure Laterality Date  . heart catheterization  11/21/10   Dr. Peter Martinique        Home Medications    Prior to Admission medications   Medication Sig Start Date End Date Taking? Authorizing Provider  ACCU-CHEK SOFTCLIX LANCETS lancets Use as instructed 12/30/17   Philemon Kingdom,  MD  digoxin (LANOXIN) 0.125 MG tablet Take 1 tablet (0.125 mg total) by mouth daily. 10/10/18   Bensimhon, Shaune Pascal, MD  empagliflozin (JARDIANCE) 25 MG TABS tablet Take 25 mg by mouth daily. 12/09/18   Philemon Kingdom, MD  furosemide (LASIX) 40 MG tablet Take 1 tablet (40 mg total) by mouth daily. 10/10/18   Bensimhon, Shaune Pascal, MD  glipiZIDE (GLUCOTROL) 5 MG tablet TAKE 1 TABLET BY MOUTH TWICE DAILY ** NEEDS NEW DOCTOR FOR FURTHER FILLS** Patient not  taking: Reported on 12/09/2018 12/19/17   Bensimhon, Shaune Pascal, MD  glucose blood (ACCU-CHEK AVIVA) test strip Use as instructed 2x a day 12/27/17   Philemon Kingdom, MD  metFORMIN (GLUCOPHAGE-XR) 500 MG 24 hr tablet Take 1 tablet (500 mg total) by mouth daily with breakfast. 12/09/18   Philemon Kingdom, MD  metoprolol succinate (TOPROL-XL) 100 MG 24 hr tablet Take 1 tablet (100 mg total) by mouth 2 (two) times daily. Take with or immediately following a meal. 10/10/18   Bensimhon, Shaune Pascal, MD  rivaroxaban (XARELTO) 20 MG TABS tablet Take 1 tablet (20 mg total) by mouth daily with supper. Crushed Xarelto can be given down a G-tube but NOT a J-Tube. 10/15/18   Bensimhon, Shaune Pascal, MD  sacubitril-valsartan (ENTRESTO) 49-51 MG Take 1 tablet by mouth 2 (two) times daily. 10/10/18   Bensimhon, Shaune Pascal, MD  spironolactone (ALDACTONE) 25 MG tablet Take 1 tablet (25 mg total) by mouth daily. 10/10/18   Bensimhon, Shaune Pascal, MD    Family History Family History  Problem Relation Age of Onset  . Heart disease Mother   . Heart failure Mother   . Rheumatic fever Mother   . Sleep apnea Father   . Diabetes Father   . Hearing loss Paternal Grandfather 77       CHF  . Colon polyps Other        Grandfather  . Sleep apnea Other   . Heart disease Maternal Grandmother     Social History Social History   Tobacco Use  . Smoking status: Never Smoker  . Smokeless tobacco: Never Used  Substance Use Topics  . Alcohol use: No  . Drug use: Yes    Comment: occasional marijuana     Allergies   Patient has no known allergies.   Review of Systems Review of Systems  Constitutional: Positive for appetite change. Negative for chills and fever.  Respiratory: Positive for chest tightness and shortness of breath.   Cardiovascular: Positive for chest pain and palpitations. Negative for leg swelling.  Gastrointestinal: Positive for abdominal distention, abdominal pain, blood in stool, nausea and vomiting.   Genitourinary: Negative for dysuria and frequency.     Physical Exam Updated Vital Signs BP (!) 173/129   Pulse (!) 106   Temp 98.9 F (37.2 C) (Oral)   Resp (!) 24   SpO2 97%   Physical Exam Constitutional:      General: He is in acute distress.     Appearance: He is well-developed.  HENT:     Head: Normocephalic and atraumatic.  Neck:     Vascular: JVD present.  Cardiovascular:     Rate and Rhythm: Tachycardia present.     Pulses: Normal pulses.     Heart sounds: Murmur present. No friction rub. No gallop.   Pulmonary:     Effort: Respiratory distress present.     Breath sounds: Examination of the left-lower field reveals decreased breath sounds and rhonchi. Decreased breath sounds and rhonchi present.  Chest:     Chest wall: Tenderness present.     Comments: POC US shows small pericardial effusion Abdominal:     Palpations: There is hepatomegaly.  Musculoskeletal: Normal range of motion.  Skin:    General: Skin is warm and dry.  Neurological:     Mental Status: He is alert.      ED Treatments / Results  Labs (all labs ordered are listed, but only abnormal results are displayed) Labs Reviewed  COMPREHENSIVE METABOLIC PANEL - Abnormal; Notable for the following components:      Result Value   Glucose, Bld 137 (*)    Total Bilirubin 1.6 (*)    All other components within normal limits  DIGOXIN LEVEL - Abnormal; Notable for the following components:   Digoxin Level <0.2 (*)    All other components within normal limits  BRAIN NATRIURETIC PEPTIDE - Abnormal; Notable for the following components:   B Natriuretic Peptide 862.4 (*)    All other components within normal limits  URINALYSIS, ROUTINE W REFLEX MICROSCOPIC - Abnormal; Notable for the following components:   Color, Urine AMBER (*)    APPearance TURBID (*)    Ketones, ur 20 (*)    Protein, ur 100 (*)    Bacteria, UA MANY (*)    All other components within normal limits  TROPONIN I (HIGH SENSITIVITY)  - Abnormal; Notable for the following components:   Troponin I (High Sensitivity) 33 (*)    All other components within normal limits  TROPONIN I (HIGH SENSITIVITY) - Abnormal; Notable for the following components:   Troponin I (High Sensitivity) 31 (*)    All other components within normal limits  SARS CORONAVIRUS 2 BY RT PCR (HOSPITAL ORDER, [facility])  URINE CULTURE  CBC WITH DIFFERENTIAL/PLATELET  LIPASE, BLOOD  PROTIME-INR  POC SARS CORONAVIRUS 2 AG -  ED  POC OCCULT BLOOD, ED  TYPE AND SCREEN  ABO/RH    EKG EKG Interpretation  Date/Time:  Saturday May 16 2019 10:56:17 EST Ventricular Rate:  106 PR Interval:    QRS Duration: 95 QT Interval:  369 QTC Calculation: 490 R Axis:   27 Text Interpretation: Sinus tachycardia Left atrial enlargement Abnormal R-wave progression, late transition Left ventricular hypertrophy Nonspecific T abnormalities, lateral leads ST elev, probable normal early repol pattern Borderline prolonged QT interval Since prior ECG, similar LVH, nonspecific changes Confirmed by Gareth Morgan [phone]) on 05/16/2019 11:21:09 AM   Radiology Dg Chest Portable 1 View  Result Date: 05/16/2019 CLINICAL DATA:  43 year old male with shortness of breath. History of diabetes, atrial fibrillation and CHF EXAM: PORTABLE CHEST 1 VIEW COMPARISON:  Prior chest x-ray 02/13/2018 FINDINGS: Progressive cardiomegaly, pulmonary vascular congestion and diffuse interstitial prominence. Overall imaging findings most consistent with CHF and mild interstitial edema. No pleural effusion, pneumothorax or focal airspace consolidation. No acute osseous abnormality. IMPRESSION: CHF with mild interstitial pulmonary edema. Electronically Signed   By: Jacqulynn Cadet M.D.   On: 05/16/2019 12:38    Procedures Procedures (including critical care time)  Medications Ordered in ED Medications - No data to display   Initial Impression / Assessment and  Plan / ED Course  I have reviewed the triage vital signs and the nursing notes.  Pertinent labs & imaging results that were available during my care of the patient were reviewed by me and considered in my medical decision making (see chart for details).   Mr. Hochstatter is a 43 y.o. male who presents with signs  and symptoms concerning for CHF exacerbation. Patient will be admitted for further evaluation and management.   CHF Exacerbation: - CXR showed CHF with mild interstitial pulmonary edema - BNP 862.4 - Troponin elevated at 31 - EF (2019) 10-15%  Hematemesis - Admits to 7 episodes of hematemesis  - Hgb: 14.2   Final Clinical Impressions(s) / ED Diagnoses   Final diagnoses:  Congestive heart failure, unspecified HF chronicity, unspecified heart failure type (HCC)  SOB (shortness of breath)  Hematemesis with nausea    ED Discharge Orders    None       Marianna Payment, MD 05/16/19 1605    Gareth Morgan, MD 05/19/19 1241

## 2019-05-16 NOTE — ED Notes (Signed)
Resident paged in reference to removing PUI status for bed placement.

## 2019-05-16 NOTE — ED Notes (Signed)
Pt received dinner tray.

## 2019-05-16 NOTE — Progress Notes (Signed)
Blood sugar checked upon arrival and it read 217 glucometer was not able to connect to chart.  Will continue to monitor

## 2019-05-16 NOTE — H&P (Signed)
Cardiology Admission History and Physical:   Patient ID: Tyrone Walker MRN: [id number]; DOB: 04-29-1976   Admission date: 05/16/2019  Primary Care Provider: Jolaine Artist, MD Primary Cardiologist: No primary care provider on file.  Primary Electrophysiologist:  None   Chief Complaint:  Dyspnea, bloating  Patient Profile:   Tyrone Walker is a 43 y.o. 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  History of Present Illness:   Mr. Donaway has a history of severely depressed left ventricular systolic function with an ejection fraction of 10-15%.  He was having problems with constipation and abdominal bloating and his primary care provider prescribed Linzess.  He subsequently began having diarrhea.  He was worried about developing dehydration since he also takes Jardiance and furosemide.  He completely stopped taking the furosemide for the last 5 days.  He has developed nausea 3 episodes of vomiting with some blood tinged vomitus.  He has not had melena or bright red blood per rectum.  He has generalized abdominal discomfort.  He denies palpitations, syncope, dizziness, headaches, focal neurological events, frank hematemesis, lower extremity edema, claudication, cough, fever, chills, sick contacts.  His typical dry weight is 207 pounds and is up to 216 pounds today.  His baseline BNP is around 200 and it is over 800 today.  Renal function parameters are normal.  This x-ray shows interstitial pulmonary edema.  He tells me that when he's had heart failure exacerbation in the past, he never develops ankle edema, but only abdominal swelling and shortness of breath.  He reports not taking any of his medications today and his blood pressure is severely elevated at approximately  160/110.  His routine home prescriptions includes a moderate dose of Entresto, a high dose of metoprolol succinate, digoxin, spironolactone, Jardiance and oral furosemide.  He is on chronic anticoagulation with Xarelto.  He reports taking his medications usually, but his compliance may be questionable.  He has been a "no-show" for his last 2 office visits in the heart failure clinic.  He saw Dr. Lovena Le in July 2019 to discuss primary prevention ICD.  He seemed agreeable to defibrillator implantation, but never called back with his decision.  He tells me that he temporarily moved to [place], but is back to live in [place].  Cardiac catheterization in 2012 showed normal coronary arteries and he required temporary treatment with intravenous milrinone for low cardiac output.  Work-up for a secondary cause for cardiomyopathy was negative, with the exception of an elevated coxsackie B2 titer.  His most recent echocardiogram was performed in June 2019  - Left ventricle: The cavity size was severely dilated. Wall   thickness was normal. The estimated ejection fraction was in the   range of 10% to 15%. Diffuse hypokinesis. Doppler parameters are   consistent with abnormal left ventricular relaxation (grade 1   diastolic dysfunction). - Mitral valve: There was mild regurgitation. - Left atrium: The atrium was moderately to severely dilated.  His most recent cardiopulmonary stress test was performed in July 2019 with a remarkably good O2 of 25.6 (67% of predicted peak VO2).  Heart Pathway Score:     Past Medical History:  Diagnosis Date  . Acute on chronic systolic (congestive) heart failure ([place]) 02/11/2017  . Atrial fibrillation ([place]) 2011  . Chronic systolic heart failure ([place])   . Colitis 10/12/10   ER visit  .  Diabetes mellitus without complication ([place])   . Elevated LFTs    likely hepatic congestion from CHF  . history Marijuana smoker   . Hypertension 10/18/2011  . Mitral valve  regurgitation: Moderate to severe per 2 d echo 02/13/2017 02/13/2017  . Non-ischemic cardiomyopathy ([place]) 10/20/10   a. cath 11/22/10: normal cors; EF 15-20%;  b. echo 11/19/10: EF 20%, mild MR, mild LAE, PASP 48; c. dx 5/12, tx with IV milrinone for low output, Coxsackie B2 titer elevated; o/w HIV, CMV, RMSF, ANA, RF, ESR, ACE all normal    Past Surgical History:  Procedure Laterality Date  . heart catheterization  11/21/10   Dr. Peter Martinique     Medications Prior to Admission: Prior to Admission medications   Medication Sig Start Date End Date Taking? Authorizing Provider  ACCU-CHEK SOFTCLIX LANCETS lancets Use as instructed 12/30/17   Philemon Kingdom, MD  digoxin (LANOXIN) 0.125 MG tablet Take 1 tablet (0.125 mg total) by mouth daily. 10/10/18   Bensimhon, Shaune Pascal, MD  empagliflozin (JARDIANCE) 25 MG TABS tablet Take 25 mg by mouth daily. 12/09/18   Philemon Kingdom, MD  furosemide (LASIX) 40 MG tablet Take 1 tablet (40 mg total) by mouth daily. 10/10/18   Bensimhon, Shaune Pascal, MD  glipiZIDE (GLUCOTROL) 5 MG tablet TAKE 1 TABLET BY MOUTH TWICE DAILY ** NEEDS NEW DOCTOR FOR FURTHER FILLS** Patient not taking: Reported on 12/09/2018 12/19/17   Bensimhon, Shaune Pascal, MD  glucose blood (ACCU-CHEK AVIVA) test strip Use as instructed 2x a day 12/27/17   Philemon Kingdom, MD  metFORMIN (GLUCOPHAGE-XR) 500 MG 24 hr tablet Take 1 tablet (500 mg total) by mouth daily with breakfast. 12/09/18   Philemon Kingdom, MD  metoprolol succinate (TOPROL-XL) 100 MG 24 hr tablet Take 1 tablet (100 mg total) by mouth 2 (two) times daily. Take with or immediately following a meal. 10/10/18   Bensimhon, Shaune Pascal, MD  rivaroxaban (XARELTO) 20 MG TABS tablet Take 1 tablet (20 mg total) by mouth daily with supper. Crushed Xarelto can be given down a G-tube but NOT a J-Tube. 10/15/18   Bensimhon, Shaune Pascal, MD  sacubitril-valsartan (ENTRESTO) 49-51 MG Take 1 tablet by mouth 2 (two) times daily. 10/10/18   Bensimhon, Shaune Pascal, MD   spironolactone (ALDACTONE) 25 MG tablet Take 1 tablet (25 mg total) by mouth daily. 10/10/18   Bensimhon, Shaune Pascal, MD     Allergies:   No Known Allergies  Social History:   Social History   Socioeconomic History  . Marital status: Single    Spouse name: Not on file  . Number of children: 2  . Years of education: Not on file  . Highest education level: Not on file  Occupational History  . Occupation: culinary/management  Social Needs  . Financial resource strain: Not on file  . Food insecurity    Worry: Not on file    Inability: Not on file  . Transportation needs    Medical: Not on file    Non-medical: Not on file  Tobacco Use  . Smoking status: Never Smoker  . Smokeless tobacco: Never Used  Substance and Sexual Activity  . Alcohol use: No  . Drug use: Yes    Comment: occasional marijuana  . Sexual activity: Yes  Lifestyle  . Physical activity    Days per week: Not on file    Minutes per session: Not on file  . Stress: Not on file  Relationships  . Social Herbalist on  phone: Not on file    Gets together: Not on file    Attends religious service: Not on file    Active member of club or organization: Not on file    Attends meetings of clubs or organizations: Not on file    Relationship status: Not on file  . Intimate partner violence    Fear of current or ex partner: Not on file    Emotionally abused: Not on file    Physically abused: Not on file    Forced sexual activity: Not on file  Other Topics Concern  . Not on file  Social History Narrative   Lives with daughter who is 54 months old and son who is 56 years old    Family History:   The patient's family history includes Colon polyps in an other family member; Diabetes in his father; Hearing loss (age of onset: 14) in his paternal grandfather; Heart disease in his maternal grandmother and mother; Heart failure in his mother; Rheumatic fever in his mother; Sleep apnea in his father and another family  member.    ROS:  Please see the history of present illness.  All other ROS reviewed and negative.     Physical Exam/Data:   Vitals:   05/16/19 1230 05/16/19 1500 05/16/19 1515 05/16/19 1530  BP: (!) 173/129 (!) 145/111 (!) 143/105 (!) 152/113  Pulse: (!) 106 (!) 48 100 100  Resp: (!) 24 17 19 13   Temp:      TempSrc:      SpO2: 97% 98% 96% 98%   No intake or output data in the 24 hours ending 05/16/19 1626 Last 3 Weights 12/09/2018 04/18/2018 02/13/2018  Weight (lbs) 219 lb 216 lb 214 lb  Weight (kg) 99.338 kg 97.977 kg 97.07 kg     There is no height or weight on file to calculate BMI.  General:  Obese, well developed, in no acute distress HEENT: normal Lymph: no adenopathy Neck: 6-7 cm JVD with mild hepatojugular reflux Endocrine:  No thryomegaly Vascular: No carotid bruits; FA pulses 2+ bilaterally without bruits  Cardiac:  normal S1, loud S2; S3 gallop is present; RRR; very faint apical holosystolic murmur, no diastolic murmurs or rubs. Lungs:  clear to auscultation bilaterally, no wheezing, rhonchi or rales  Abd: soft, nontender, no hepatomegaly  Ext: no edema, extremities are warm Musculoskeletal:  No deformities, BUE and BLE strength normal and equal Skin: warm and dry  Neuro:  CNs 2-12 intact, no focal abnormalities noted Psych:  Normal affect    EKG:  The ECG that was done today was personally reviewed and demonstrates sinus rhythm, very prominent changes of left atrial abnormality, LVH, nonspecific minor intraventricular conduction delay, prolonged QTC 490 ms  Relevant CV Studies: 06/2011: LVEF 35-40%, diffuse hypokinesis.  Grade 1 diastolic dysfunction.   01/24/12: LVEF 45-62%, grade 1 diastolic dysfunction.  11/13: ECHO EF 50-55% review of mitral regurgitation 03/20/13: EF 30%  trivial mitral regurgitation 01/2017: EF 20-25%.   Moderate to severe mitral regurgitation  His most recent echocardiogram was performed in June 2019  - Left ventricle: The cavity size was  severely dilated. Wall   thickness was normal. The estimated ejection fraction was in the   range of 10% to 15%. Diffuse hypokinesis. Doppler parameters are   consistent with abnormal left ventricular relaxation (grade 1   diastolic dysfunction). - Mitral valve: There was mild regurgitation. - Left atrium: The atrium was moderately to severely dilated.  His most recent cardiopulmonary stress test  was performed in July 2019 with a remarkably good O2 of 25.6 (67% of predicted peak VO2).  Cardiac catheterization 2012 HEMODYNAMIC DATA:  Right atrial pressure is 18/16 with a mean of 15 mmHg.  Right ventricular pressure is 47 with an EDP of 15 mmHg. Pulmonary artery pressure is 48/30 with a mean of 38 mmHg.  Pulmonary capillary wedge pressure is 35/37 with a mean of 33 mmHg.  Aortic pressure is 99/77 with a mean of 86 mmHg.  Left ventricular pressure is 95 with an EDP of 30 mmHg.  Cardiac output is 3.64 L/min by Fick with an index of 1.59.  The aortic saturation is 95% with pulmonary artery saturation of 51%.  ANGIOGRAPHIC DATA:  Left ventricular angiography performed in the RAO view demonstrates marked left ventricular enlargement with severe global hypokinesis and ejection fraction of 15-20%.  The left coronary artery arises and distributes in a dominant fashion. The left main coronary artery is normal.  The left anterior descending artery is normal.  The left circumflex coronary artery is dominant and is normal.  The right coronary artery is relatively small and is normal.  FINAL INTERPRETATION: 1. Normal coronary anatomy. 2. Severe left ventricular dysfunction with a low cardiac output. 3. Moderate pulmonary hypertension with elevated left ventricular     filling pressures.  Laboratory Data:  High Sensitivity Troponin:   Recent Labs  Lab 05/16/19 1306 05/16/19 1451  TROPONINIHS 33* 31*      Chemistry Recent Labs  Lab 05/16/19 1306  NA 140  K 4.5  CL 104  CO2  25  GLUCOSE 137*  BUN 10  CREATININE 1.23  CALCIUM 9.3  GFRNONAA >60  GFRAA >60  ANIONGAP 11    Recent Labs  Lab 05/16/19 1306  PROT 7.2  ALBUMIN 4.0  AST 32  ALT 34  ALKPHOS 44  BILITOT 1.6*   Hematology Recent Labs  Lab 05/16/19 1306  WBC 6.4  RBC 4.80  HGB 14.2  HCT 43.5  MCV 90.6  MCH 29.6  MCHC 32.6  RDW 13.4  PLT 215   BNP Recent Labs  Lab 05/16/19 1306  BNP 862.4*    DDimer No results for input(s): DDIMER in the last 168 hours.   Radiology/Studies:  Dg Chest Portable 1 View  Result Date: 05/16/2019 CLINICAL DATA:  43 year old male with shortness of breath. History of diabetes, atrial fibrillation and CHF EXAM: PORTABLE CHEST 1 VIEW COMPARISON:  Prior chest x-ray 02/13/2018 FINDINGS: Progressive cardiomegaly, pulmonary vascular congestion and diffuse interstitial prominence. Overall imaging findings most consistent with CHF and mild interstitial edema. No pleural effusion, pneumothorax or focal airspace consolidation. No acute osseous abnormality. IMPRESSION: CHF with mild interstitial pulmonary edema. Electronically Signed   By: Jacqulynn Cadet M.D.   On: 05/16/2019 12:38    Assessment and Plan:   1. CHF: Secondary to nonischemic cardiomyopathy, exacerbated by noncompliance with diuretic therapy.  He has evidence of hypervolemia, after stopping his loop diuretic for 5 days.  He has primarily left heart failure symptoms and signs with very little to support right heart failure on physical exam.  He seems to have a fairly narrow margin between heart failure decompensation and hypovolemia.  We will give him intravenous loop diuretics today, restart Entresto and beta-blockers.  Also on Jardiance, excellent choice for his diabetes.  His blood pressure is severely elevated and this may be the most important target to achieve compensation. 2. AFIB: Currently in sinus rhythm.  Note severely dilated left atrium.  The reports  compliance with Xarelto without  bleeding complications. 3. HTN: Generally his blood pressure has been very well controlled over the last 12 months.  I am not sure whether his marked systolic and diastolic hypertension today is related to "rebound" from missing beta-blocker doses. 4. DM: On Jardiance and Metformin (the latter limited by problems with diarrhea).  Notes that his endocrinologist documents discontinuing his glipizide at the previous appointment.  HgbA1c was reported as 7% in Dr. Arman Filter 03/31/2019 note, although in the computer the most recent value was 9.1% in June 2020 5. OSA: Not on CPAP since 2014, which is not favorable for his heart failure. 6. MR: At 1 point described as moderate to severe, but seems to vary with his degree of compensation, consistent with secondary valvular abnormality.  Severity of Illness: The appropriate patient status for this patient is INPATIENT. Inpatient status is judged to be reasonable and necessary in order to provide the required intensity of service to ensure the patient's safety. The patient's presenting symptoms, physical exam findings, and initial radiographic and laboratory data in the context of their chronic comorbidities is felt to place them at high risk for further clinical deterioration. Furthermore, it is not anticipated that the patient will be medically stable for discharge from the hospital within 2 midnights of admission. The following factors support the patient status of inpatient.   " The patient's presenting symptoms include dyspnea at rest. " The worrisome physical exam findings include elevated jugular venous pulsations, increased weight. " The initial radiographic and laboratory data are worrisome because of interstitial pulmonary edema on chest x-ray, markedly elevated BNP. " The chronic co-morbidities include obesity, obstructive sleep apnea, diabetes mellitus, severe hypertension, history of paroxysmal atrial fibrillation.   * I certify that at the point of  admission it is my clinical judgment that the patient will require inpatient hospital care spanning beyond 2 midnights from the point of admission due to high intensity of service, high risk for further deterioration and high frequency of surveillance required.*    For questions or updates, please contact [place] Please consult [email or web address] for contact info under        Signed, Sanda Klein, MD  05/16/2019 4:26 PM

## 2019-05-16 NOTE — ED Triage Notes (Signed)
Pt here from home for SOB, N/V since Sunday. Has hx of chf, diabetes and a-fib. Pt states he felt like he was in a-fib, went to PCP on Wednesday who gave pt pantaprazole w/ no help. Pt states he hasn't been able to tolerate food or liquids. BP 150/114, HR 105, O2 sat 95% RA.

## 2019-05-17 ENCOUNTER — Inpatient Hospital Stay (HOSPITAL_COMMUNITY): Payer: Medicaid Other

## 2019-05-17 DIAGNOSIS — I5023 Acute on chronic systolic (congestive) heart failure: Secondary | ICD-10-CM

## 2019-05-17 DIAGNOSIS — I340 Nonrheumatic mitral (valve) insufficiency: Secondary | ICD-10-CM

## 2019-05-17 LAB — HEMOGLOBIN AND HEMATOCRIT, BLOOD
HCT: 43.8 % (ref 39.0–52.0)
Hemoglobin: 14.5 g/dL (ref 13.0–17.0)

## 2019-05-17 LAB — MAGNESIUM: Magnesium: 1.8 mg/dL (ref 1.7–2.4)

## 2019-05-17 LAB — URINE CULTURE: Culture: NO GROWTH

## 2019-05-17 LAB — BASIC METABOLIC PANEL
Anion gap: 11 (ref 5–15)
BUN: 12 mg/dL (ref 6–20)
CO2: 32 mmol/L (ref 22–32)
Calcium: 9.3 mg/dL (ref 8.9–10.3)
Chloride: 99 mmol/L (ref 98–111)
Creatinine, Ser: 1.44 mg/dL — ABNORMAL HIGH (ref 0.61–1.24)
GFR calc Af Amer: >60 mL/min (ref >60)
GFR calc non Af Amer: 58 mL/min — ABNORMAL LOW (ref >60)
Glucose, Bld: 121 mg/dL — ABNORMAL HIGH (ref 70–99)
Potassium: 3.6 mmol/L (ref 3.5–5.1)
Sodium: 142 mmol/L (ref 135–145)

## 2019-05-17 LAB — GLUCOSE, CAPILLARY
Glucose-Capillary: 249 mg/dL — ABNORMAL HIGH (ref 70–99)
Glucose-Capillary: 193 mg/dL — ABNORMAL HIGH (ref 70–99)
Glucose-Capillary: 97 mg/dL (ref 70–99)
Glucose-Capillary: 128 mg/dL — ABNORMAL HIGH (ref 70–99)

## 2019-05-17 LAB — HEMOGLOBIN A1C
Hgb A1c MFr Bld: 7.3 % — ABNORMAL HIGH (ref 4.8–5.6)
Mean Plasma Glucose: 162.81 mg/dL

## 2019-05-17 LAB — ECHOCARDIOGRAM COMPLETE
Height: 72 in
Weight: 3206.4 oz

## 2019-05-17 MED ORDER — FUROSEMIDE 10 MG/ML IJ SOLN: 40.0000 mg | Freq: Every day | INTRAMUSCULAR | Status: DC

## 2019-05-17 MED ORDER — POTASSIUM CHLORIDE CRYS ER 20 MEQ PO TBCR
40.0000 meq | EXTENDED_RELEASE_TABLET | Freq: Once | ORAL | Status: AC
  Administered 2019-05-17: 40 meq via ORAL
  Filled 2019-05-17: qty 2

## 2019-05-17 NOTE — Progress Notes (Signed)
Reassed RR 18

## 2019-05-17 NOTE — Progress Notes (Signed)
  Echocardiogram 2D Echocardiogram has been performed.  Tyrone Walker 05/17/2019, 10:28 AM

## 2019-05-17 NOTE — Progress Notes (Signed)
Progress Note  Patient Name: Tyrone Walker Date of Encounter: 05/17/2019  Primary Cardiologist: Bensimhon  Subjective   Breathing much improved  Inpatient Medications    Scheduled Meds: . canagliflozin  100 mg Oral QAC breakfast  . digoxin  0.125 mg Oral Daily  . furosemide  40 mg Intravenous Q12H  . insulin aspart  0-15 Units Subcutaneous TID WC  . isosorbide-hydrALAZINE  1 tablet Oral TID  . metoprolol succinate  100 mg Oral BID  . rivaroxaban  20 mg Oral Q supper  . sacubitril-valsartan  1 tablet Oral BID  . spironolactone  25 mg Oral Daily   Continuous Infusions:  PRN Meds: acetaminophen   Vital Signs    Vitals:   05/16/19 2200 05/17/19 0056 05/17/19 0427 05/17/19 0432  BP:  120/82  (!) 133/96  Pulse:  89  92  Resp: 20 18  20   Temp:  98.5 F (36.9 C)  98.4 F (36.9 C)  TempSrc:  Oral  Oral  SpO2:  99%  98%  Weight:   90.9 kg   Height:        Intake/Output Summary (Last 24 hours) at 05/17/2019 0737 Last data filed at 05/17/2019 1610 Gross per 24 hour  Intake 118 ml  Output 4125 ml  Net -4007 ml   Last 3 Weights 05/17/2019 05/16/2019 12/09/2018  Weight (lbs) 200 lb 6.4 oz 204 lb 4.8 oz 219 lb  Weight (kg) 90.901 kg 92.67 kg 99.338 kg      Telemetry    SR - Personally Reviewed  ECG    n/a - Personally Reviewed  Physical Exam   GEN: No acute distress.   Neck: No JVD Cardiac: RRR, no murmurs, rubs, or gallops.  Respiratory: Clear to auscultation bilaterally. GI: Soft, nontender, non-distended  MS: No edema; No deformity. Neuro:  Nonfocal  Psych: Normal affect   Labs    High Sensitivity Troponin:   Recent Labs  Lab 05/16/19 1306 05/16/19 1451  TROPONINIHS 33* 31*      Chemistry Recent Labs  Lab 05/16/19 1306 05/17/19 0350  NA 140 142  K 4.5 3.6  CL 104 99  CO2 25 32  GLUCOSE 137* 121*  BUN 10 12  CREATININE 1.23 1.44*  CALCIUM 9.3 9.3  PROT 7.2  --   ALBUMIN 4.0  --   AST 32  --   ALT 34  --   ALKPHOS 44  --    BILITOT 1.6*  --   GFRNONAA >60 58*  GFRAA >60 >60  ANIONGAP 11 11     Hematology Recent Labs  Lab 05/16/19 1306 05/17/19 0350  WBC 6.4  --   RBC 4.80  --   HGB 14.2 14.5  HCT 43.5 43.8  MCV 90.6  --   MCH 29.6  --   MCHC 32.6  --   RDW 13.4  --   PLT 215  --     BNP Recent Labs  Lab 05/16/19 1306  BNP 862.4*     DDimer No results for input(s): DDIMER in the last 168 hours.   Radiology    Dg Chest Portable 1 View  Result Date: 05/16/2019 CLINICAL DATA:  43 year old male with shortness of breath. History of diabetes, atrial fibrillation and CHF EXAM: PORTABLE CHEST 1 VIEW COMPARISON:  Prior chest x-ray 02/13/2018 FINDINGS: Progressive cardiomegaly, pulmonary vascular congestion and diffuse interstitial prominence. Overall imaging findings most consistent with CHF and mild interstitial edema. No pleural effusion, pneumothorax or focal airspace consolidation. No acute  osseous abnormality. IMPRESSION: CHF with mild interstitial pulmonary edema. Electronically Signed   By: Malachy Moan M.D.   On: 05/16/2019 12:38    Cardiac Studies     Patient Profile     Tyrone Walker is a 43 y.o. 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  Assessment & Plan    1. Acute on chronic systolic HF - history of NICM, 11/2017 echo LVEF 10-15%, grade I diastolic dysfunction - repeat echo pending - presented volume overloaded in setting of poor diuretic compliance - negative 4L yesterday, he received IV lasix 40mg  x1. Due for bid dosing today. Mild uptrend in Cr, exam and symptoms drastically improved. Got IV lasix this AM, d/c now, likely oral diuretic tomorrow.  - other medical therapy with digoxin, bidil, toprol, entresto,aldactone  2. HTN - presented with  sevrelely elevated bp's  3. Afib - has been in SR, on xarelto for stroke prevention    For questions or updates, please contact [facility] Please consult [email or web address] for contact info under        Signed, , MD  05/17/2019, 7:37 AM

## 2019-05-17 NOTE — Plan of Care (Signed)
  Problem: Education: Goal: Knowledge of General Education information will improve Description: Including pain rating scale, medication(s)/side effects and non-pharmacologic comfort measures Outcome: Progressing   Problem: Health Behavior/Discharge Planning: Goal: Ability to manage health-related needs will improve Outcome: Progressing   Problem: Clinical Measurements: Goal: Ability to maintain clinical measurements within normal limits will improve Outcome: Progressing   Problem: Activity: Goal: Capacity to carry out activities will improve Outcome: Progressing   

## 2019-05-18 DIAGNOSIS — I5043 Acute on chronic combined systolic (congestive) and diastolic (congestive) heart failure: Secondary | ICD-10-CM

## 2019-05-18 LAB — GLUCOSE, CAPILLARY
Glucose-Capillary: 122 mg/dL — ABNORMAL HIGH (ref 70–99)
Glucose-Capillary: 165 mg/dL — ABNORMAL HIGH (ref 70–99)
Glucose-Capillary: 141 mg/dL — ABNORMAL HIGH (ref 70–99)
Glucose-Capillary: 188 mg/dL — ABNORMAL HIGH (ref 70–99)
Glucose-Capillary: 139 mg/dL — ABNORMAL HIGH (ref 70–99)

## 2019-05-18 LAB — BASIC METABOLIC PANEL
Anion gap: 12 (ref 5–15)
BUN: 18 mg/dL (ref 6–20)
CO2: 25 mmol/L (ref 22–32)
Calcium: 9.3 mg/dL (ref 8.9–10.3)
Chloride: 99 mmol/L (ref 98–111)
Creatinine, Ser: 1.44 mg/dL — ABNORMAL HIGH (ref 0.61–1.24)
GFR calc Af Amer: >60 mL/min (ref >60)
GFR calc non Af Amer: 58 mL/min — ABNORMAL LOW (ref >60)
Glucose, Bld: 202 mg/dL — ABNORMAL HIGH (ref 70–99)
Potassium: 3.5 mmol/L (ref 3.5–5.1)
Sodium: 136 mmol/L (ref 135–145)

## 2019-05-18 LAB — MAGNESIUM: Magnesium: 1.9 mg/dL (ref 1.7–2.4)

## 2019-05-18 MED ORDER — SODIUM CHLORIDE 0.9 % IV SOLN
INTRAVENOUS | Status: DC
  Administered 2019-05-19: 05:00:00 via INTRAVENOUS

## 2019-05-18 MED ORDER — METOPROLOL SUCCINATE ER 100 MG PO TB24
100.0000 mg | ORAL_TABLET | Freq: Every day | ORAL | Status: DC
  Administered 2019-05-19: 100 mg via ORAL
  Filled 2019-05-18: qty 1

## 2019-05-18 MED ORDER — SODIUM CHLORIDE 0.9% FLUSH
3.0000 mL | Freq: Two times a day (BID) | INTRAVENOUS | Status: DC
  Administered 2019-05-18 – 2019-05-19 (×3): 3 mL via INTRAVENOUS

## 2019-05-18 MED ORDER — IVABRADINE HCL 5 MG PO TABS
5.0000 mg | ORAL_TABLET | Freq: Two times a day (BID) | ORAL | Status: DC
  Administered 2019-05-18 – 2019-05-19 (×2): 5 mg via ORAL
  Filled 2019-05-18 (×3): qty 1

## 2019-05-18 MED ORDER — SODIUM CHLORIDE 0.9 % IV SOLN: 250.0000 mL | INTRAVENOUS | Status: DC | PRN

## 2019-05-18 MED ORDER — SODIUM CHLORIDE 0.9% FLUSH: 3.0000 mL | INTRAVENOUS | Status: DC | PRN

## 2019-05-18 MED ORDER — ASPIRIN 81 MG PO CHEW
81.0000 mg | CHEWABLE_TABLET | ORAL | Status: AC
  Administered 2019-05-19: 81 mg via ORAL
  Filled 2019-05-18: qty 1

## 2019-05-18 NOTE — Consult Note (Addendum)
Advanced Heart Failure Team Consult Note   Primary Physician: Dr Blunt  HF Cardiologist: Dr Phillip Heal   Reason for Consultation:Heart Failure   HPI:    Tyrone Walker is seen today for evaluation of heart failure at the request of Dr Harl Bowie.   [name] is a 43 year old with a history of severe OSA (AHI ~80), PAF, DMII, obesity,  NICM, and chronic systolic heart failure dating back to 2012.  Most recent [place] 2012 with normal cors.   In the past he was followed in the HF clinic but has not been seen since June 2019. Says he has missed appointments because he moved from [place].   In July 2019 he saw Dr Lovena Le for possible ICD but wanted to hold off.   Last week he was seen by PCP due to N/V and abdominal pain. PCP was concerned about IBS. Started on PPI, miralex, linzess with no relief. Says he stopped lasix, metoprolol, and xarelto  4-5 days. His weight went up from 207-->216 pounds.   Presented to [facility] ED with increased dyspnea. CXR on admit  With pulmonary vascular congestion. Pertinent admission labs included: K 4.5, Creatinine 1.23, BNP 863, HS Trop 33>3, Hgb 14.2. Started on IV lasix but stopped 11/22 due to creatinine bump 1.2>1.4 .  Weight has gone down 4 pounds. No longer having abdominal pain. Complaining of severe headaches in the evening.   Feeling much better. Denies abdominal pain. Denis n/v   Echo 05/16/21- EF 20% RV normal  Echo 2018 EF 20-25%   Review of Systems: [y] = yes, [ ]  = no   . General: Weight gain [ ] ; Weight loss [ ] ; Anorexia [ ] ; Fatigue [ Y]; Fever [ ] ; Chills [ ] ; Weakness [ ]   . Cardiac: Chest pain/pressure [ ] ; Resting SOB [ ] ; Exertional SOB [Y ]; Orthopnea [ ] ; Pedal Edema [ ] ; Palpitations [ ] ; Syncope [ ] ; Presyncope [ ] ; Paroxysmal nocturnal dyspnea[ ]   . Pulmonary: Cough [ ] ; Wheezing[ ] ; Hemoptysis[ ] ; Sputum [ ] ; Snoring [ ]   . GI: Vomiting[ ] ; Dysphagia[ ] ; Melena[ ] ; Hematochezia [ ] ; Heartburn[ ] ; Abdominal pain [ ] ; Constipation [ ] ;  Diarrhea [ ] ; BRBPR [ ]   . GU: Hematuria[ ] ; Dysuria [ ] ; Nocturia[ ]   . Vascular: Pain in legs with walking [ ] ; Pain in feet with lying flat [ ] ; Non-healing sores [ ] ; Stroke [ ] ; TIA [ ] ; Slurred speech [ ] ;  . Neuro: Headaches[ ] ; Vertigo[ ] ; Seizures[ ] ; Paresthesias[ ] ;Blurred vision [ ] ; Diplopia [ ] ; Vision changes [ ]   . Ortho/Skin: Arthritis [ ] ; Joint pain [Y ]; Muscle pain [ ] ; Joint swelling [ ] ; Back Pain [ ] ; Rash [ ]   . Psych: Depression[ ] ; Anxiety[ ]   . Heme: Bleeding problems [ ] ; Clotting disorders [ ] ; Anemia [ ]   . Endocrine: Diabetes [Y ]; Thyroid dysfunction[ ]   Home Medications Prior to Admission medications   Medication Sig Start Date End Date Taking? Authorizing Provider  digoxin (LANOXIN) 0.125 MG tablet Take 1 tablet (0.125 mg total) by mouth daily. 10/10/18  Yes Bensimhon, Shaune Pascal, MD  empagliflozin (JARDIANCE) 25 MG TABS tablet Take 25 mg by mouth daily. 12/09/18  Yes Philemon Kingdom, MD  furosemide (LASIX) 40 MG tablet Take 1 tablet (40 mg total) by mouth daily. 10/10/18  Yes Bensimhon, Shaune Pascal, MD  linaclotide [facility]) 145 MCG CAPS capsule Take 145 mcg by mouth daily before breakfast.   Yes  [provider]  metoprolol succinate (TOPROL-XL) 100 MG 24 hr tablet Take 1 tablet (100 mg total) by mouth 2 (two) times daily. Take with or immediately following a meal. 10/10/18  Yes Bensimhon, Shaune Pascal, MD  rivaroxaban (XARELTO) 20 MG TABS tablet Take 1 tablet (20 mg total) by mouth daily with supper. Crushed Xarelto can be given down a G-tube but NOT a J-Tube. Patient taking differently: Take 20 mg by mouth daily with supper.  10/15/18  Yes Bensimhon, Shaune Pascal, MD  sacubitril-valsartan (ENTRESTO) 49-51 MG Take 1 tablet by mouth 2 (two) times daily. 10/10/18  Yes Bensimhon, Shaune Pascal, MD  spironolactone (ALDACTONE) 25 MG tablet Take 1 tablet (25 mg total) by mouth daily. 10/10/18  Yes Bensimhon, Shaune Pascal, MD  glucose blood (ACCU-CHEK AVIVA) test strip Use as  instructed 2x a day Patient not taking: Reported on 05/16/2019 12/27/17   Philemon Kingdom, MD  metFORMIN (GLUCOPHAGE-XR) 500 MG 24 hr tablet Take 1 tablet (500 mg total) by mouth daily with breakfast. 12/09/18   Philemon Kingdom, MD    Past Medical History: Past Medical History:  Diagnosis Date  . Acute on chronic systolic (congestive) heart failure ([place]) 02/11/2017  . Atrial fibrillation ([place]) 2011  . Chronic systolic heart failure ([place])   . Colitis 10/12/10   ER visit  . Diabetes mellitus without complication ([place])   . Elevated LFTs    likely hepatic congestion from CHF  . history Marijuana smoker   . Hypertension 10/18/2011  . Mitral valve regurgitation: Moderate to severe per 2 d echo 02/13/2017 02/13/2017  . Non-ischemic cardiomyopathy ([place]) 10/20/10   a. cath 11/22/10: normal cors; EF 15-20%;  b. echo 11/19/10: EF 20%, mild MR, mild LAE, PASP 48; c. dx 5/12, tx with IV milrinone for low output, Coxsackie B2 titer elevated; o/w HIV, CMV, RMSF, ANA, RF, ESR, ACE all normal    Past Surgical History: Past Surgical History:  Procedure Laterality Date  . heart catheterization  11/21/10   Dr. Peter Martinique    Family History: Family History  Problem Relation Age of Onset  . Heart disease Mother   . Heart failure Mother   . Rheumatic fever Mother   . Sleep apnea Father   . Diabetes Father   . Hearing loss Paternal Grandfather 40       CHF  . Colon polyps Other        Grandfather  . Sleep apnea Other   . Heart disease Maternal Grandmother     Social History: Social History   Socioeconomic History  . Marital status: Single    Spouse name: Not on file  . Number of children: 2  . Years of education: Not on file  . Highest education level: Not on file  Occupational History  . Occupation: culinary/management  Social Needs  . Financial resource strain: Not on file  . Food insecurity    Worry: Not on file    Inability: Not on file  . Transportation needs    Medical: Not on  file    Non-medical: Not on file  Tobacco Use  . Smoking status: Never Smoker  . Smokeless tobacco: Never Used  Substance and Sexual Activity  . Alcohol use: No  . Drug use: Yes    Comment: occasional marijuana  . Sexual activity: Yes  Lifestyle  . Physical activity    Days per week: Not on file    Minutes per session: Not on file  . Stress: Not on file  Relationships  . Social Herbalist on phone: Not on file    Gets together: Not on file    Attends religious service: Not on file    Active member of club or organization: Not on file    Attends meetings of clubs or organizations: Not on file    Relationship status: Not on file  Other Topics Concern  . Not on file  Social History Narrative   Lives with daughter who is 19 months old and son who is 4 years old    Allergies:  No Known Allergies  Objective:    Vital Signs:   Temp:  [98.2 F (36.8 C)-99.5 F (37.5 C)] 98.3 F (36.8 C) (11/23 0731) Pulse Rate:  [85-101] 85 (11/23 0841) Resp:  [14-20] 20 (11/23 0414) BP: (100-146)/(62-106) 121/89 (11/23 0841) SpO2:  [98 %-100 %] 100 % (11/23 0731) Weight:  [90.8 kg] 90.8 kg (11/23 0414) Last BM Date: 05/17/19  Weight change: Filed Weights   05/16/19 2037 05/17/19 0427 05/18/19 0414  Weight: 92.7 kg 90.9 kg 90.8 kg    Intake/Output:   Intake/Output Summary (Last 24 hours) at 05/18/2019 0918 Last data filed at 05/18/2019 0834 Gross per 24 hour  Intake 940 ml  Output 1525 ml  Net -585 ml      Physical Exam    General:  Sitting on the side of the bed.  No resp difficulty HEENT: normal Neck: supple. JVP 5-6 . Carotids 2+ bilat; no bruits. No lymphadenopathy or thyromegaly appreciated. Cor: PMI nondisplaced. Regular rate & rhythm. No rubs, or murmurs. + S3  Lungs: clear Abdomen: soft, nontender, nondistended. No hepatosplenomegaly. No bruits or masses. Good bowel sounds. Extremities: no cyanosis, clubbing, rash, edema Neuro: alert & orientedx3,  cranial nerves grossly intact. moves all 4 extremities w/o difficulty. Affect pleasant   Telemetry   SR- ST 90-100s   EKG    Sinuys Tach on admit 106 bpm   Labs   Basic Metabolic Panel: Recent Labs  Lab 05/16/19 1306 05/17/19 0350 05/18/19 0358  NA 140 142 136  K 4.5 3.6 3.5  CL 104 99 99  CO2 25 32 25  GLUCOSE 137* 121* 202*  BUN 10 12 18   CREATININE 1.23 1.44* 1.44*  CALCIUM 9.3 9.3 9.3  MG  --  1.8 1.9    Liver Function Tests: Recent Labs  Lab 05/16/19 1306  AST 32  ALT 34  ALKPHOS 44  BILITOT 1.6*  PROT 7.2  ALBUMIN 4.0   Recent Labs  Lab 05/16/19 1306  LIPASE 22   No results for input(s): AMMONIA in the last 168 hours.  CBC: Recent Labs  Lab 05/16/19 1306 05/17/19 0350  WBC 6.4  --   NEUTROABS 4.2  --   HGB 14.2 14.5  HCT 43.5 43.8  MCV 90.6  --   PLT 215  --     Cardiac Enzymes: No results for input(s): CKTOTAL, CKMB, CKMBINDEX, TROPONINI in the last 168 hours.  BNP: BNP (last 3 results) Recent Labs    05/16/19 1306  BNP 862.4*    ProBNP (last 3 results) No results for input(s): PROBNP in the last 8760 hours.   CBG: Recent Labs  Lab 05/17/19 1245 05/17/19 1613 05/17/19 2117 05/18/19 0255 05/18/19 0625  GLUCAP 193* 97 128* 122* 165*    Coagulation Studies: Recent Labs    05/16/19 1306  LABPROT 14.9  INR 1.2     Imaging    No results found.   Medications:  Current Medications: . canagliflozin  100 mg Oral QAC breakfast  . digoxin  0.125 mg Oral Daily  . insulin aspart  0-15 Units Subcutaneous TID WC  . isosorbide-hydrALAZINE  1 tablet Oral TID  . metoprolol succinate  100 mg Oral BID  . rivaroxaban  20 mg Oral Q supper  . sacubitril-valsartan  1 tablet Oral BID  . spironolactone  25 mg Oral Daily     Infusions:      Assessment/Plan   1. A/C Systolic Heart Failure, NICM  ECHO this admit EF 20% RV normal. EF has been low since 2013. He has been seen by Dr Lovena Le in 2019 but he wanted to  hold off on ICD.  - Prior to admit he was having abdominal pain, N/V. ? Concerned he may have low output heart failure.  - Volume status stable. Appears euvolemic. Hold diuretics today.  mg daily.  - Cut back toporol xl to 100 mg daily. Heart rate still running fast. Add corlanor 5 mg twice a day.  - Continue entresto 49-51 mg twice a day.   - Stop bidil due severe headaches.  - continue dig 0.125 mg daily.  - Set up RHC/LHC tomorrow.  - Can check CPX next week.  - Continue Invokana. Resume jardiance as outpateint   2. PAF Maintaining NSR. Cut back Toprol 100 mg daily.   - Continue xarelto 20 mg daily.   3. DMII Hgb A1C 7.3.  Consult diabetes coordinator.   4. Abdominal Pain Resolved with diuresis.   5. OSA - In [place] he had CPAP but now has living here and does not have CPAP.  We will try to obtain sleep study.  From his Cardiologist in [place]. [phone].  6. Blood Type O+   Set up for RHC/LHC tomorrow.  Obtain records from [place]. Consult cardiac rehab.     Length of Stay: 2  Darrick Grinder, NP  05/18/2019, 9:18 AM  Advanced Heart Failure Team Pager [phone] (M-F; 7a - 4p)  Please contact [facility] for night-coverage after hours (4p -7a ) and weekends on [email or web address]  Patient seen and examined with the above-signed Advanced Practice Provider and/or Housestaff. I personally reviewed laboratory data, imaging studies and relevant notes. I independently examined the patient and formulated the important aspects of the plan. I have edited the note to reflect any of my changes or salient points. I have personally discussed the plan with the patient and/or family.  43 y/o male with longstanding severe HF due to NICM, DM2, OSA, APF admitted with recurrent HF and probable low output. Now improved with IV diuresis.   Echo reviewed personally. EF 20% with normal RV   General:  Sitting up in bed. Daiphoretic No resp difficulty HEENT: normal Neck: supple. No obvious  JVD. Carotids 2+ bilat; no bruits. No lymphadenopathy or thryomegaly appreciated. Cor: PMI nondisplaced. Tachy regular +s3.  Lungs: clear Abdomen: soft, nontender, nondistended. No hepatosplenomegaly. No bruits or masses. Good bowel sounds. Extremities: no cyanosis, clubbing, rash, edema Neuro: alert & orientedx3, cranial nerves grossly intact. moves all 4 extremities w/o difficulty. Affect pleasant  He remains quite tenuous with persistent tachycardia. I think he is headed toward advanced therapies (either transplant or VAD). He is Blood Type O+. Will plan R/L cath in am to reassess. If output OK will need CPX to further risk stratify. Continue HF meds. Cut Toprol back slightly and add ivabradine. Will need CPAP records from [place].   Glori Bickers, MD  11:51 AM

## 2019-05-18 NOTE — Discharge Summary (Addendum)
Advanced Heart Failure Team  Discharge Summary   Patient ID: Tyrone Walker MRN: [id number], DOB/AGE: 26-Jun-1975 43 y.o. Admit date: 05/16/2019 D/C date:     05/19/2019   Primary Discharge Diagnoses:  1. A/C Systolic Heart Failure, NICM  2. PAF 3. DMII 4. Abdominal Pain 5. OSA 6. Blood Type O+   Hospital Course:   Tyrone Walker is a 43 year old with a history of severe OSA (AHI ~80), PAF, DMII, obesity,  NICM, and chronic systolic heart failure dating back to 2012.  Most recent [place] 2012 with normal cors.   Admitted with A/C systolic heart failure. Diuresed with IV lasix until euvolemic. He was dry on cath so will start lasix 40 mg po daily on 05/21/19. Marland Kitchen HF meds optimized.  RHC/LHC showed normal coronaries, severe NICM, well preserved cardiac output and low filling pressures.    See below for detailed problems list. He will contine to be followed closely in the HF clinic. Plan to set up CPX during his follow up next week. Will also need to refer back to Dr Lovena Le for ICD.   1. A/C Systolic Heart Failure, NICM  ECHO this admit EF 20% RV normal. EF has been low since 2013. He has been seen by Dr Lovena Le in 2019 but he wanted to hold off on ICD.  - Prior to admit he was having abdominal pain, N/V. ? Concerned he may have low output heart failure. Once diuresed he underwent RHC/LHC.  - Diuresed with IV lasix and transitioned to lasix 40 mg po daily. He will start po lasix on 05/21/19. .  - Due to concern for low output Toporol xl was cut back to 100 mg daily. - Heart rate  running fast so corlanor added and increased to 7.5 mg twice a day.  Will need to watch for recurrent A fib.  - Continue entresto 49-51 mg twice a day.   - Intolerant bidil due severe headaches.  - continue dig 0.125 mg daily.  - Can check CPX next week. We will set up next week.  -  Resume jardiance as outpateint   2. PAF Maintaining NSR. Cut back Toprol 100 mg daily.   - Continue xarelto 20 mg daily.   3. DMII Hgb  A1C 7.3.  Consult diabetes coordinator.   4. Abdominal Pain Resolved with diuresis.   5. OSA - In [place] he had CPAP but now has living here and does not have CPAP.  We will try to obtain sleep study.  From his Cardiologist in [place]. [phone].  6. Blood Type O+    Discharge Weight: 200.6 pounds.  Discharge Vitals: Blood pressure 124/85, pulse 100, temperature 98.6 F (37 C), temperature source Oral, resp. rate 18, height 6' (1.829 m), weight 91 kg, SpO2 97 %.  Labs: Lab Results  Component Value Date   WBC 6.4 05/16/2019   HGB 14.5 05/17/2019   HCT 43.8 05/17/2019   MCV 90.6 05/16/2019   PLT 215 05/16/2019    Recent Labs  Lab 05/16/19 1306  05/19/19 0434  NA 140   < > 137  K 4.5   < > 4.2  CL 104   < > 102  CO2 25   < > 26  BUN 10   < > 20  CREATININE 1.23   < > 1.35*  CALCIUM 9.3   < > 9.0  PROT 7.2  --   --   BILITOT 1.6*  --   --   ALKPHOS 44  --   --  ALT 34  --   --   AST 32  --   --   GLUCOSE 137*   < > 140*   < > = values in this interval not displayed.   Lab Results  Component Value Date   CHOL 130 04/18/2018   HDL 36.50 (L) 04/18/2018   LDLCALC 54 04/18/2018   TRIG 198.0 (H) 04/18/2018   BNP (last 3 results) Recent Labs    05/16/19 1306  BNP 862.4*    ProBNP (last 3 results) No results for input(s): PROBNP in the last 8760 hours.   Diagnostic Studies/Procedures   No results found.  Discharge Medications   Allergies as of 05/19/2019   No Known Allergies     Medication List    STOP taking these medications   Linzess 145 MCG Caps capsule Generic drug: linaclotide     TAKE these medications   digoxin 0.125 MG tablet Commonly known as: LANOXIN Take 1 tablet (0.125 mg total) by mouth daily.   furosemide 40 MG tablet Commonly known as: LASIX Take 1 tablet (40 mg total) by mouth daily. Start taking on: May 21, 2019 What changed: These instructions start on May 21, 2019. If you are unsure what to do until  then, ask your doctor or other care provider.   glucose blood test strip Commonly known as: Accu-Chek Aviva Use as instructed 2x a day   ivabradine 7.5 MG Tabs tablet Commonly known as: Corlanor Take 1 tablet (7.5 mg total) by mouth 2 (two) times daily with a meal.   Jardiance 25 MG Tabs tablet Generic drug: empagliflozin Take 25 mg by mouth daily.   metFORMIN 500 MG 24 hr tablet Commonly known as: GLUCOPHAGE-XR Take 1 tablet (500 mg total) by mouth daily with breakfast.   metoprolol succinate 100 MG 24 hr tablet Commonly known as: TOPROL-XL Take 1 tablet (100 mg total) by mouth daily. Take with or immediately following a meal. What changed: when to take this   rivaroxaban 20 MG Tabs tablet Commonly known as: Xarelto Take 1 tablet (20 mg total) by mouth daily with supper. Crushed Xarelto can be given down a G-tube but NOT a J-Tube. What changed: additional instructions   sacubitril-valsartan 49-51 MG Commonly known as: Entresto Take 1 tablet by mouth 2 (two) times daily.   spironolactone 25 MG tablet Commonly known as: ALDACTONE Take 1 tablet (25 mg total) by mouth daily.       Disposition   The patient will be discharged in stable condition to home. Discharge Instructions    (HEART FAILURE PATIENTS) Call MD:  Anytime you have any of the following symptoms: 1) 3 pound weight gain in 24 hours or 5 pounds in 1 week 2) shortness of breath, with or without a dry hacking cough 3) swelling in the hands, feet or stomach 4) if you have to sleep on extra pillows at night in order to breathe.   Complete by: As directed    Diet - low sodium heart healthy   Complete by: As directed    Heart Failure patients record your daily weight using the same scale at the same time of day   Complete by: As directed    Increase activity slowly   Complete by: As directed      Follow-up Information    [facility] On 05/26/2019.   Specialty:  Cardiology Why: at [address]  Contact information: [address] [id number] mc [address]  [place] [address] [phone]            Duration of Discharge Encounter: Greater than 35 minutes   Signed, Amy Clegg NP-C  05/19/2019, 9:02 AM   Agree with above. Home today. Follow in HF Clinic. Will need CPX.   Arvilla Meres, MD  4:24 PM

## 2019-05-18 NOTE — Progress Notes (Signed)
Inpatient Diabetes Program Recommendations  AACE/ADA: New Consensus Statement on Inpatient Glycemic Control (2015)  Target Ranges:  Prepandial:   less than 140 mg/dL      Peak postprandial:   less than 180 mg/dL (1-2 hours)      Critically ill patients:  140 - 180 mg/dL   Results for Tyrone Walker, Tyrone Walker (MRN [id number]) as of 05/18/2019 11:18  Ref. Range 05/17/2019 06:06 05/17/2019 12:45 05/17/2019 16:13 05/17/2019 21:17  Glucose-Capillary Latest Ref Range: 70 - 99 mg/dL 249 (H)  5 units NOVOLOG  193 (H)  3 units NOVOLOG  97 128 (H)   Results for Tyrone Walker, Tyrone Walker (MRN [id number]) as of 05/18/2019 11:18  Ref. Range 05/18/2019 06:25 05/18/2019 10:48  Glucose-Capillary Latest Ref Range: 70 - 99 mg/dL 165 (H)  3 units NOVOLOG  141 (H)   Results for Tyrone Walker, Tyrone Walker (MRN [id number]) as of 05/18/2019 11:18  Ref. Range 05/17/2019 03:50  Hemoglobin A1C Latest Ref Range: 4.8 - 5.6 % 7.3 (H)     Admit with: CHF   History: DM, CHF  Home DM Meds: Jardiance 25 mg Daily       Metformin 500 mg Daily  Current Orders: Novolog Moderate Correction Scale/ SSI (0-15 units) TID AC       Invokana 100 mg Daily     Endocrinologist: Dr. Philemon Kingdom with [facility]--last seen 12/09/2018 (was a NO Show for his 10/06 appointment).  Told his ENDO in June that he had been off all DM meds b/c he lost his Medicaid--At the visit in June, patient was instructed to take the following: Metformin ER 500 mg Daily with Dinner Jardiance 25 mg Daily with Breakfast  It looks as if patient has been taking his meds since June as his Current A1c level is down to 7.3% this admission.    CBGs well controlled on current regimen.  Will follow and assist as needed.      Wyn Quaker RN, MSN, CDE Diabetes Coordinator Inpatient Glycemic Control Team Team Pager: [phone] (8a-5p)

## 2019-05-18 NOTE — Progress Notes (Signed)
CARDIAC REHAB PHASE I   PRE:  Rate/Rhythm: 96 SR    BP: sitting 110/82    SaO2:   MODE:  Ambulation: 470 ft   POST:  Rate/Rhythm: 102 ST    BP: sitting 116/86     SaO2:   Pt able to walk in hall independently. Feels well, no SOB. Feels he know most things about HF, sts he knows low sodium diet and HF book. Gave Na content sheet to reiterate. We can continue to talk. Encouraged him to walk. He sts he feels his normal self. [phone]   [place], ACSM 05/18/2019 11:57 AM

## 2019-05-19 ENCOUNTER — Encounter (HOSPITAL_COMMUNITY): Payer: Self-pay | Admitting: Internal Medicine

## 2019-05-19 ENCOUNTER — Encounter (HOSPITAL_COMMUNITY)
Admission: EM | Disposition: A | Discharge status: Home Health | Payer: Self-pay | Source: Home / Self Care | Attending: Cardiovascular Disease

## 2019-05-19 DIAGNOSIS — I480 Paroxysmal atrial fibrillation: Secondary | ICD-10-CM

## 2019-05-19 HISTORY — PX: RIGHT/LEFT HEART CATH AND CORONARY ANGIOGRAPHY: CATH118266

## 2019-05-19 LAB — POCT I-STAT EG7
Acid-base deficit: 2 mmol/L (ref 0.0–2.0)
Acid-base deficit: 2 mmol/L (ref 0.0–2.0)
Acid-base deficit: 3 mmol/L — ABNORMAL HIGH (ref 0.0–2.0)
Acid-base deficit: 4 mmol/L — ABNORMAL HIGH (ref 0.0–2.0)
Bicarbonate: 20.8 mmol/L (ref 20.0–28.0)
Bicarbonate: 21.8 mmol/L (ref 20.0–28.0)
Bicarbonate: 23 mmol/L (ref 20.0–28.0)
Bicarbonate: 23.2 mmol/L (ref 20.0–28.0)
Calcium, Ion: 1.07 mmol/L — ABNORMAL LOW (ref 1.15–1.40)
Calcium, Ion: 1.12 mmol/L — ABNORMAL LOW (ref 1.15–1.40)
Calcium, Ion: 1.17 mmol/L (ref 1.15–1.40)
Calcium, Ion: 1.2 mmol/L (ref 1.15–1.40)
HCT: 46 % (ref 39.0–52.0)
HCT: 48 % (ref 39.0–52.0)
HCT: 48 % (ref 39.0–52.0)
HCT: 49 % (ref 39.0–52.0)
Hemoglobin: 15.6 g/dL (ref 13.0–17.0)
Hemoglobin: 16.3 g/dL (ref 13.0–17.0)
Hemoglobin: 16.3 g/dL (ref 13.0–17.0)
Hemoglobin: 16.7 g/dL (ref 13.0–17.0)
O2 Saturation: 70 %
O2 Saturation: 72 %
O2 Saturation: 73 %
O2 Saturation: 76 %
Potassium: 3.9 mmol/L (ref 3.5–5.1)
Potassium: 4 mmol/L (ref 3.5–5.1)
Potassium: 4.1 mmol/L (ref 3.5–5.1)
Potassium: 4.2 mmol/L (ref 3.5–5.1)
Sodium: 137 mmol/L (ref 135–145)
Sodium: 139 mmol/L (ref 135–145)
Sodium: 139 mmol/L (ref 135–145)
Sodium: 139 mmol/L (ref 135–145)
TCO2: 22 mmol/L (ref 22–32)
TCO2: 23 mmol/L (ref 22–32)
TCO2: 24 mmol/L (ref 22–32)
TCO2: 24 mmol/L (ref 22–32)
pCO2, Ven: 35.6 mmHg — ABNORMAL LOW (ref 44.0–60.0)
pCO2, Ven: 37.5 mmHg — ABNORMAL LOW (ref 44.0–60.0)
pCO2, Ven: 39.1 mmHg — ABNORMAL LOW (ref 44.0–60.0)
pCO2, Ven: 40.9 mmHg — ABNORMAL LOW (ref 44.0–60.0)
pH, Ven: 7.362 (ref 7.250–7.430)
pH, Ven: 7.373 (ref 7.250–7.430)
pH, Ven: 7.375 (ref 7.250–7.430)
pH, Ven: 7.379 (ref 7.250–7.430)
pO2, Ven: 37 mmHg (ref 32.0–45.0)
pO2, Ven: 39 mmHg (ref 32.0–45.0)
pO2, Ven: 39 mmHg (ref 32.0–45.0)
pO2, Ven: 42 mmHg (ref 32.0–45.0)

## 2019-05-19 LAB — POCT I-STAT 7, (LYTES, BLD GAS, ICA,H+H)
Acid-base deficit: 4 mmol/L — ABNORMAL HIGH (ref 0.0–2.0)
Bicarbonate: 19.3 mmol/L — ABNORMAL LOW (ref 20.0–28.0)
Calcium, Ion: 1 mmol/L — ABNORMAL LOW (ref 1.15–1.40)
HCT: 45 % (ref 39.0–52.0)
Hemoglobin: 15.3 g/dL (ref 13.0–17.0)
O2 Saturation: 99 %
Potassium: 3.7 mmol/L (ref 3.5–5.1)
Sodium: 142 mmol/L (ref 135–145)
TCO2: 20 mmol/L — ABNORMAL LOW (ref 22–32)
pCO2 arterial: 31.5 mmHg — ABNORMAL LOW (ref 32.0–48.0)
pH, Arterial: 7.396 (ref 7.350–7.450)
pO2, Arterial: 116 mmHg — ABNORMAL HIGH (ref 83.0–108.0)

## 2019-05-19 LAB — BASIC METABOLIC PANEL
Anion gap: 9 (ref 5–15)
BUN: 20 mg/dL (ref 6–20)
CO2: 26 mmol/L (ref 22–32)
Calcium: 9 mg/dL (ref 8.9–10.3)
Chloride: 102 mmol/L (ref 98–111)
Creatinine, Ser: 1.35 mg/dL — ABNORMAL HIGH (ref 0.61–1.24)
GFR calc Af Amer: >60 mL/min (ref >60)
GFR calc non Af Amer: >60 mL/min (ref >60)
Glucose, Bld: 140 mg/dL — ABNORMAL HIGH (ref 70–99)
Potassium: 4.2 mmol/L (ref 3.5–5.1)
Sodium: 137 mmol/L (ref 135–145)

## 2019-05-19 LAB — GLUCOSE, CAPILLARY
Glucose-Capillary: 137 mg/dL — ABNORMAL HIGH (ref 70–99)
Glucose-Capillary: 259 mg/dL — ABNORMAL HIGH (ref 70–99)

## 2019-05-19 SURGERY — RIGHT/LEFT HEART CATH AND CORONARY ANGIOGRAPHY
Anesthesia: LOCAL

## 2019-05-19 MED ORDER — HEPARIN (PORCINE) IN NACL 1000-0.9 UT/500ML-% IV SOLN
INTRAVENOUS | Status: DC | PRN
  Administered 2019-05-19 (×2): 500 mL

## 2019-05-19 MED ORDER — SODIUM CHLORIDE 0.9 % IV SOLN
INTRAVENOUS | Status: AC
  Administered 2019-05-19: 10:00:00 via INTRAVENOUS

## 2019-05-19 MED ORDER — METOPROLOL SUCCINATE ER 100 MG PO TB24: 100.0000 mg | ORAL_TABLET | Freq: Every day | ORAL | 1 refills | Status: AC

## 2019-05-19 MED ORDER — IVABRADINE HCL 7.5 MG PO TABS: 7.5000 mg | ORAL_TABLET | Freq: Two times a day (BID) | ORAL | 6 refills | Status: AC

## 2019-05-19 MED ORDER — LABETALOL HCL 5 MG/ML IV SOLN: 10.0000 mg | INTRAVENOUS | Status: DC | PRN

## 2019-05-19 MED ORDER — LIDOCAINE HCL (PF) 1 % IJ SOLN
INTRAMUSCULAR | Status: DC | PRN
  Administered 2019-05-19: 2 mL via INTRADERMAL

## 2019-05-19 MED ORDER — SODIUM CHLORIDE 0.9% FLUSH
3.0000 mL | Freq: Two times a day (BID) | INTRAVENOUS | Status: DC
  Administered 2019-05-19: 13:00:00 3 mL via INTRAVENOUS

## 2019-05-19 MED ORDER — ONDANSETRON HCL 4 MG/2ML IJ SOLN: 4.0000 mg | Freq: Four times a day (QID) | INTRAMUSCULAR | Status: DC | PRN

## 2019-05-19 MED ORDER — HYDRALAZINE HCL 20 MG/ML IJ SOLN: 10.0000 mg | INTRAMUSCULAR | Status: DC | PRN

## 2019-05-19 MED ORDER — FENTANYL CITRATE (PF) 100 MCG/2ML IJ SOLN
INTRAMUSCULAR | Status: AC
  Filled 2019-05-19: qty 2

## 2019-05-19 MED ORDER — MIDAZOLAM HCL 2 MG/2ML IJ SOLN
INTRAMUSCULAR | Status: DC | PRN
  Administered 2019-05-19: 1 mg via INTRAVENOUS

## 2019-05-19 MED ORDER — IOHEXOL 350 MG/ML SOLN
INTRAVENOUS | Status: DC | PRN
  Administered 2019-05-19: 45 mL via INTRA_ARTERIAL

## 2019-05-19 MED ORDER — FENTANYL CITRATE (PF) 100 MCG/2ML IJ SOLN
INTRAMUSCULAR | Status: DC | PRN
  Administered 2019-05-19: 25 ug via INTRAVENOUS

## 2019-05-19 MED ORDER — SODIUM CHLORIDE 0.9 % IV SOLN: 250.0000 mL | INTRAVENOUS | Status: DC | PRN

## 2019-05-19 MED ORDER — SODIUM CHLORIDE 0.9% FLUSH: 3.0000 mL | INTRAVENOUS | Status: DC | PRN

## 2019-05-19 MED ORDER — HEPARIN SODIUM (PORCINE) 1000 UNIT/ML IJ SOLN
INTRAMUSCULAR | Status: AC
  Filled 2019-05-19: qty 1

## 2019-05-19 MED ORDER — RIVAROXABAN 20 MG PO TABS: 20.0000 mg | ORAL_TABLET | Freq: Every day | ORAL | Status: DC

## 2019-05-19 MED ORDER — HEPARIN (PORCINE) IN NACL 1000-0.9 UT/500ML-% IV SOLN
INTRAVENOUS | Status: AC
  Filled 2019-05-19: qty 1000

## 2019-05-19 MED ORDER — MIDAZOLAM HCL 2 MG/2ML IJ SOLN
INTRAMUSCULAR | Status: AC
  Filled 2019-05-19: qty 2

## 2019-05-19 MED ORDER — VERAPAMIL HCL 2.5 MG/ML IV SOLN
INTRAVENOUS | Status: DC | PRN
  Administered 2019-05-19: 10 mL via INTRA_ARTERIAL

## 2019-05-19 MED ORDER — LIDOCAINE HCL (PF) 1 % IJ SOLN
INTRAMUSCULAR | Status: AC
  Filled 2019-05-19: qty 30

## 2019-05-19 MED ORDER — HEPARIN SODIUM (PORCINE) 1000 UNIT/ML IJ SOLN
INTRAMUSCULAR | Status: DC | PRN
  Administered 2019-05-19: 4500 [IU] via INTRAVENOUS

## 2019-05-19 MED ORDER — ACETAMINOPHEN 325 MG PO TABS: 650.0000 mg | ORAL_TABLET | ORAL | Status: DC | PRN

## 2019-05-19 MED ORDER — FUROSEMIDE 40 MG PO TABS: 40.0000 mg | ORAL_TABLET | Freq: Every day | ORAL | 3 refills | Status: AC

## 2019-05-19 MED ORDER — VERAPAMIL HCL 2.5 MG/ML IV SOLN
INTRAVENOUS | Status: AC
  Filled 2019-05-19: qty 2

## 2019-05-19 SURGICAL SUPPLY — 10 items
CATH 5FR JL3.5 JR4 ANG PIG MP (CATHETERS) ×1 IMPLANT
CATH BALLN WEDGE 5F 110CM (CATHETERS) ×1 IMPLANT
DEVICE RAD COMP TR BAND LRG (VASCULAR PRODUCTS) ×1 IMPLANT
GLIDESHEATH SLEND SS 6F .021 (SHEATH) ×1 IMPLANT
GUIDEWIRE .025 260CM (WIRE) ×1 IMPLANT
GUIDEWIRE INQWIRE 1.5J.035X260 (WIRE) IMPLANT
INQWIRE 1.5J .035X260CM (WIRE) ×2
PACK CARDIAC CATHETERIZATION (CUSTOM PROCEDURE TRAY) ×2 IMPLANT
SHEATH GLIDE SLENDER 4/5FR (SHEATH) ×1 IMPLANT
TRANSDUCER W/STOPCOCK (MISCELLANEOUS) ×2 IMPLANT

## 2019-05-19 NOTE — Progress Notes (Signed)
Advanced Heart Failure Rounding Note   Subjective:    Feels better but still dyspneic with exertion. No CP, orthopnea or PND. Renal function stable.     Objective:   Weight Range:  Vital Signs:   Temp:  [98 F (36.7 C)-98.2 F (36.8 C)] 98.2 F (36.8 C) (11/24 0531) Pulse Rate:  [85-103] 97 (11/24 0531) Resp:  [17-22] 18 (11/24 0531) BP: (117-138)/(89-106) 127/98 (11/24 0531) SpO2:  [99 %-100 %] 99 % (11/24 0531) Weight:  [91 kg] 91 kg (11/24 0539) Last BM Date: 05/18/19  Weight change: Filed Weights   05/17/19 0427 05/18/19 0414 05/19/19 0539  Weight: 90.9 kg 90.8 kg 91 kg    Intake/Output:   Intake/Output Summary (Last 24 hours) at 05/19/2019 0744 Last data filed at 05/19/2019 0519 Gross per 24 hour  Intake 1002 ml  Output 1525 ml  Net -523 ml     Physical Exam: General:  Sitting up in bed. No resp difficulty HEENT: normal Neck: supple. JVP 7-8 . Carotids 2+ bilat; no bruits. No lymphadenopathy or thryomegaly appreciated. Cor: PMI nondisplaced. Regular tachy +s3 Lungs: clear Abdomen: soft, nontender, nondistended. No hepatosplenomegaly. No bruits or masses. Good bowel sounds. Extremities: no cyanosis, clubbing, rash, edema Neuro: alert & orientedx3, cranial nerves grossly intact. moves all 4 extremities w/o difficulty. Affect pleasant  Telemetry: S inus tach 110-120 Personally reviewed   Labs: Basic Metabolic Panel: Recent Labs  Lab 05/16/19 1306 05/17/19 0350 05/18/19 0358 05/19/19 0434  NA 140 142 136 137  K 4.5 3.6 3.5 4.2  CL 104 99 99 102  CO2 25 32 25 26  GLUCOSE 137* 121* 202* 140*  BUN 10 12 18 20   CREATININE 1.23 1.44* 1.44* 1.35*  CALCIUM 9.3 9.3 9.3 9.0  MG  --  1.8 1.9  --     Liver Function Tests: Recent Labs  Lab 05/16/19 1306  AST 32  ALT 34  ALKPHOS 44  BILITOT 1.6*  PROT 7.2  ALBUMIN 4.0   Recent Labs  Lab 05/16/19 1306  LIPASE 22   No results for input(s): AMMONIA in the last 168 hours.  CBC: Recent  Labs  Lab 05/16/19 1306 05/17/19 0350  WBC 6.4  --   NEUTROABS 4.2  --   HGB 14.2 14.5  HCT 43.5 43.8  MCV 90.6  --   PLT 215  --     Cardiac Enzymes: No results for input(s): CKTOTAL, CKMB, CKMBINDEX, TROPONINI in the last 168 hours.  BNP: BNP (last 3 results) Recent Labs    05/16/19 1306  BNP 862.4*    ProBNP (last 3 results) No results for input(s): PROBNP in the last 8760 hours.    Other results:  Imaging:  No results found.   Medications:     Scheduled Medications: . [MAR Hold] canagliflozin  100 mg Oral QAC breakfast  . [MAR Hold] digoxin  0.125 mg Oral Daily  . [MAR Hold] insulin aspart  0-15 Units Subcutaneous TID WC  . [MAR Hold] ivabradine  5 mg Oral BID WC  . [MAR Hold] metoprolol succinate  100 mg Oral Daily  . [MAR Hold] sacubitril-valsartan  1 tablet Oral BID  . [MAR Hold] sodium chloride flush  3 mL Intravenous Q12H  . [MAR Hold] spironolactone  25 mg Oral Daily     Infusions: . sodium chloride    . sodium chloride 10 mL/hr at 05/19/19 0518     PRN Medications:  sodium chloride, [MAR Hold] acetaminophen, Heparin (Porcine) in NaCl,  sodium chloride flush   Assessment/Plan:   1. A/C Systolic Heart Failure, NICM  ECHO this admit EF 20% RV normal. EF has been low since 2013. He has been seen by Dr Ladona Ridgel in 2019 but he wanted to hold off on ICD.  - Prior to admit he was having abdominal pain, N/V. ? Concerned he may have low output heart failure.  - Volume status stable. Appears euvolemic.  Lasix on hold  -  Toporol xl cut back to 100 mg daily. Heart rate still running fast.  - Corlanor 5 mg bid added.  - Continue entresto 49-51 mg twice a day.   - Off bidil due severe headaches.  - continue dig 0.125 mg daily.  - Plan for RHC/LHC tomorrow.  - Can check CPX next week.  - Continue Invokana. Resume jardiance as outpateint   2. PAF - Maintaining NSR. Cut back Toprol 100 mg daily.   - Continue xarelto 20 mg daily. (hold for cath)   3. DMII Hgb A1C 7.3.  Consult diabetes coordinator.   4. OSA, severe - In [place] he had CPAP but now has living here and does not have CPAP.   He remains quite tenuous with persistent tachycardia. I think he is headed toward advanced therapies (either transplant or VAD). He is Blood Type O+. Will plan R/L cath today. If output OK will need CPX to further risk stratify. Continue HF meds.    Length of Stay: 3   Arvilla Meres  MD 05/19/2019, 7:44 AM  Advanced Heart Failure Team Pager [phone] (M-F; 7a - 4p)  Please contact [facility] for night-coverage after hours (4p -7a ) and weekends on [email or web address]

## 2019-05-19 NOTE — Progress Notes (Signed)
Discussed/reviewed HF booklet. Pt very receptive. He knows how to care for himself but admits he needs to tighten up on his diet. Gave walking guidelines.  [id number] [place], ACSM 11:49 AM 05/19/2019

## 2019-05-19 NOTE — Interval H&P Note (Signed)
History and Physical Interval Note:  05/19/2019 7:49 AM  Tyrone Walker  has presented today for surgery, with the diagnosis of Chf.  The various methods of treatment have been discussed with the patient and family. After consideration of risks, benefits and other options for treatment, the patient has consented to  Procedure(s): RIGHT/LEFT HEART CATH AND CORONARY ANGIOGRAPHY (N/A) and possible coronanry angioplasty as a surgical intervention.  The patient's history has been reviewed, patient examined, no change in status, stable for surgery.  I have reviewed the patient's chart and labs.  Questions were answered to the patient's satisfaction.     Daniel Bensimhon

## 2019-05-19 NOTE — H&P (View-Only) (Signed)
Advanced Heart Failure Rounding Note   Subjective:    Feels better but still dyspneic with exertion. No CP, orthopnea or PND. Renal function stable.     Objective:   Weight Range:  Vital Signs:   Temp:  [98 F (36.7 C)-98.2 F (36.8 C)] 98.2 F (36.8 C) (11/24 0531) Pulse Rate:  [85-103] 97 (11/24 0531) Resp:  [17-22] 18 (11/24 0531) BP: (117-138)/(89-106) 127/98 (11/24 0531) SpO2:  [99 %-100 %] 99 % (11/24 0531) Weight:  [91 kg] 91 kg (11/24 0539) Last BM Date: 05/18/19  Weight change: Filed Weights   05/17/19 0427 05/18/19 0414 05/19/19 0539  Weight: 90.9 kg 90.8 kg 91 kg    Intake/Output:   Intake/Output Summary (Last 24 hours) at 05/19/2019 0744 Last data filed at 05/19/2019 0519 Gross per 24 hour  Intake 1002 ml  Output 1525 ml  Net -523 ml     Physical Exam: General:  Sitting up in bed. No resp difficulty HEENT: normal Neck: supple. JVP 7-8 . Carotids 2+ bilat; no bruits. No lymphadenopathy or thryomegaly appreciated. Cor: PMI nondisplaced. Regular tachy +s3 Lungs: clear Abdomen: soft, nontender, nondistended. No hepatosplenomegaly. No bruits or masses. Good bowel sounds. Extremities: no cyanosis, clubbing, rash, edema Neuro: alert & orientedx3, cranial nerves grossly intact. moves all 4 extremities w/o difficulty. Affect pleasant  Telemetry: S inus tach 110-120 Personally reviewed   Labs: Basic Metabolic Panel: Recent Labs  Lab 05/16/19 1306 05/17/19 0350 05/18/19 0358 05/19/19 0434  NA 140 142 136 137  K 4.5 3.6 3.5 4.2  CL 104 99 99 102  CO2 25 32 25 26  GLUCOSE 137* 121* 202* 140*  BUN 10 12 18 20   CREATININE 1.23 1.44* 1.44* 1.35*  CALCIUM 9.3 9.3 9.3 9.0  MG  --  1.8 1.9  --     Liver Function Tests: Recent Labs  Lab 05/16/19 1306  AST 32  ALT 34  ALKPHOS 44  BILITOT 1.6*  PROT 7.2  ALBUMIN 4.0   Recent Labs  Lab 05/16/19 1306  LIPASE 22   No results for input(s): AMMONIA in the last 168 hours.  CBC: Recent  Labs  Lab 05/16/19 1306 05/17/19 0350  WBC 6.4  --   NEUTROABS 4.2  --   HGB 14.2 14.5  HCT 43.5 43.8  MCV 90.6  --   PLT 215  --     Cardiac Enzymes: No results for input(s): CKTOTAL, CKMB, CKMBINDEX, TROPONINI in the last 168 hours.  BNP: BNP (last 3 results) Recent Labs    05/16/19 1306  BNP 862.4*    ProBNP (last 3 results) No results for input(s): PROBNP in the last 8760 hours.    Other results:  Imaging:  No results found.   Medications:     Scheduled Medications: . [MAR Hold] canagliflozin  100 mg Oral QAC breakfast  . [MAR Hold] digoxin  0.125 mg Oral Daily  . [MAR Hold] insulin aspart  0-15 Units Subcutaneous TID WC  . [MAR Hold] ivabradine  5 mg Oral BID WC  . [MAR Hold] metoprolol succinate  100 mg Oral Daily  . [MAR Hold] sacubitril-valsartan  1 tablet Oral BID  . [MAR Hold] sodium chloride flush  3 mL Intravenous Q12H  . [MAR Hold] spironolactone  25 mg Oral Daily     Infusions: . sodium chloride    . sodium chloride 10 mL/hr at 05/19/19 0518     PRN Medications:  sodium chloride, [MAR Hold] acetaminophen, Heparin (Porcine) in NaCl,  sodium chloride flush   Assessment/Plan:   1. A/C Systolic Heart Failure, NICM  ECHO this admit EF 20% RV normal. EF has been low since 2013. He has been seen by Dr Ladona Ridgel in 2019 but he wanted to hold off on ICD.  - Prior to admit he was having abdominal pain, N/V. ? Concerned he may have low output heart failure.  - Volume status stable. Appears euvolemic.  Lasix on hold  -  Toporol xl cut back to 100 mg daily. Heart rate still running fast.  - Corlanor 5 mg bid added.  - Continue entresto 49-51 mg twice a day.   - Off bidil due severe headaches.  - continue dig 0.125 mg daily.  - Plan for RHC/LHC tomorrow.  - Can check CPX next week.  - Continue Invokana. Resume jardiance as outpateint   2. PAF - Maintaining NSR. Cut back Toprol 100 mg daily.   - Continue xarelto 20 mg daily. (hold for cath)   3. DMII Hgb A1C 7.3.  Consult diabetes coordinator.   4. OSA, severe - In [place] he had CPAP but now has living here and does not have CPAP.   He remains quite tenuous with persistent tachycardia. I think he is headed toward advanced therapies (either transplant or VAD). He is Blood Type O+. Will plan R/L cath today. If output OK will need CPX to further risk stratify. Continue HF meds.    Length of Stay: 3   Arvilla Meres  MD 05/19/2019, 7:44 AM  Advanced Heart Failure Team Pager [phone] (M-F; 7a - 4p)  Please contact [facility] for night-coverage after hours (4p -7a ) and weekends on [email or web address]

## 2019-05-19 NOTE — TOC Transition Note (Addendum)
Transition of Care [facility]) - CM/SW Discharge Note   Patient Details  Name: Karlos Scadden MRN: [id number] Date of Birth: Aug 13, 1975  Transition of [facility]) CM/SW Contact:  Zenon Mayo, RN Phone Number: 05/19/2019, 10:12 AM   Clinical Narrative:    Patient is for discharge, He has a scale at home, he eats a now NA diet.  NCM offered choice for [facility], He chose [facility], awaiting a call back from [place].  Valerie with [facility] states she can take referral, patient states great he would like [facility].  Soc will begin 24 to 48 hrs post dc.     Final next level of care: [place] Barriers to Discharge: No Barriers Identified   Patient Goals and CMS Choice Patient states their goals for this hospitalization and ongoing recovery are:: ho homr CMS Medicare.gov Compare Post Acute Care list provided to:: Patient Choice offered to / list presented to : Patient  Discharge Placement                       Discharge Plan and Services                DME Arranged: (NA)         HH Arranged: RN, Disease Management [place] Agency: [facility] Date [place]: 05/19/19 Time Walker: [id number] Representative spoke with at [place]: [place] ([place]) Interventions     Readmission Risk Interventions No flowsheet data found.

## 2019-05-26 ENCOUNTER — Other Ambulatory Visit: Payer: Self-pay

## 2019-05-26 ENCOUNTER — Ambulatory Visit (HOSPITAL_COMMUNITY)
Admission: RE | Admit: 2019-05-26 | Discharge: 2019-05-26 | Disposition: A | Discharge status: Home / Self Care | Payer: Medicaid Other | Source: Ambulatory Visit | Attending: Adult Health | Admitting: Adult Health

## 2019-05-26 ENCOUNTER — Encounter (HOSPITAL_COMMUNITY): Payer: Self-pay

## 2019-05-26 VITALS — BP 110/63 | HR 77 | Wt 207.2 lb

## 2019-05-26 DIAGNOSIS — I110 Hypertensive heart disease with heart failure: Secondary | ICD-10-CM | POA: Insufficient documentation

## 2019-05-26 DIAGNOSIS — G4733 Obstructive sleep apnea (adult) (pediatric): Secondary | ICD-10-CM | POA: Diagnosis not present

## 2019-05-26 DIAGNOSIS — I340 Nonrheumatic mitral (valve) insufficiency: Secondary | ICD-10-CM | POA: Diagnosis not present

## 2019-05-26 DIAGNOSIS — Z79899 Other long term (current) drug therapy: Secondary | ICD-10-CM | POA: Insufficient documentation

## 2019-05-26 DIAGNOSIS — F1221 Cannabis dependence, in remission: Secondary | ICD-10-CM | POA: Diagnosis not present

## 2019-05-26 DIAGNOSIS — E1165 Type 2 diabetes mellitus with hyperglycemia: Secondary | ICD-10-CM | POA: Diagnosis not present

## 2019-05-26 DIAGNOSIS — I10 Essential (primary) hypertension: Secondary | ICD-10-CM | POA: Diagnosis not present

## 2019-05-26 DIAGNOSIS — Z7984 Long term (current) use of oral hypoglycemic drugs: Secondary | ICD-10-CM | POA: Insufficient documentation

## 2019-05-26 DIAGNOSIS — I5022 Chronic systolic (congestive) heart failure: Secondary | ICD-10-CM | POA: Diagnosis not present

## 2019-05-26 DIAGNOSIS — I428 Other cardiomyopathies: Secondary | ICD-10-CM

## 2019-05-26 DIAGNOSIS — I480 Paroxysmal atrial fibrillation: Secondary | ICD-10-CM | POA: Diagnosis not present

## 2019-05-26 DIAGNOSIS — Z7901 Long term (current) use of anticoagulants: Secondary | ICD-10-CM | POA: Insufficient documentation

## 2019-05-26 DIAGNOSIS — E118 Type 2 diabetes mellitus with unspecified complications: Secondary | ICD-10-CM

## 2019-05-26 DIAGNOSIS — R0683 Snoring: Secondary | ICD-10-CM

## 2019-05-26 LAB — BASIC METABOLIC PANEL
Anion gap: 15 (ref 5–15)
BUN: 12 mg/dL (ref 6–20)
CO2: 22 mmol/L (ref 22–32)
Calcium: 9.7 mg/dL (ref 8.9–10.3)
Chloride: 101 mmol/L (ref 98–111)
Creatinine, Ser: 1.1 mg/dL (ref 0.61–1.24)
GFR calc Af Amer: >60 mL/min (ref >60)
GFR calc non Af Amer: >60 mL/min (ref >60)
Glucose, Bld: 214 mg/dL — ABNORMAL HIGH (ref 70–99)
Potassium: 4.4 mmol/L (ref 3.5–5.1)
Sodium: 138 mmol/L (ref 135–145)

## 2019-05-26 NOTE — Progress Notes (Signed)
Patient ID: Tyrone Walker, male   DOB: 10-27-1975, 43 y.o.   MRN: [id number]     Advanced Heart Failure Clinic Note  Primary HF Cardiologist:  Dr. Glori Bickers PCP: None   History of Present Illness: Tyrone Walker is a 43 y.o. 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 20%.   Cath 11/2010 showed normal cors.  He had serology done to evaluate etiology of his myopathy.  All titers were negative except for Coxsackie B2 titer was elevated.   Admitted 8/20 through 02/14/2017 with increased SOB. Diuresed with IV lasix. ECHO with reduced EF 20-25%. Discharge weight 214 pounds.   Admitted 38/18/29 with A/C systolic heart failure. Diuresed with IV lasix until euvolemic. He was dry on cath so will start lasix 40 mg po daily on 05/21/19. HF meds optimized.  RHC/LHC showed normal coronaries, severe NICM, well preserved cardiac output and low filling pressures. Placed on corlanor due to elevated heart rate.   Today he returns for HF follow up.Overall feeling fine. Says he feels so much better. Able to walk up 14 steps.  Denies SOB/PND/Orthopnea. Appetite ok. No fever or chills. Weight at home 205  pounds. Taking all medications.  CPX 02/05/11: pVO2 23.6 (71%)  Ve/VCO2 27 O2 pulse 80% RER 1.25 VE/MVV 56%  06/2011: LVEF 35-40%, diffuse hypokinesis.  Grade 1 diastolic dysfunction.   01/24/12: LVEF 93-71%, grade 1 diastolic dysfunction.  11/13: ECHO EF 50-55%  03/20/13: EF 30%  01/2017: EF 20-25%.  04/2019: EF 20% RV normal   Past Medical History:  Diagnosis Date  . Acute on chronic systolic (congestive) heart failure ([place]) 02/11/2017  . Atrial fibrillation ([place]) 2011  . Chronic systolic heart failure ([place])   . Colitis 10/12/10   ER visit  . Diabetes mellitus without complication ([place])   . Elevated LFTs    likely hepatic congestion from CHF  . history Marijuana smoker   . Hypertension 10/18/2011  . Mitral valve regurgitation: Moderate to severe per 2 d echo  02/13/2017 02/13/2017  . Non-ischemic cardiomyopathy ([place]) 10/20/10   a. cath 11/22/10: normal cors; EF 15-20%;  b. echo 11/19/10: EF 20%, mild MR, mild LAE, PASP 48; c. dx 5/12, tx with IV milrinone for low output, Coxsackie B2 titer elevated; o/w HIV, CMV, RMSF, ANA, RF, ESR, ACE all normal    Current Outpatient Medications  Medication Sig Dispense Refill  . digoxin (LANOXIN) 0.125 MG tablet Take 1 tablet (0.125 mg total) by mouth daily. 90 tablet 1  . empagliflozin (JARDIANCE) 25 MG TABS tablet Take 25 mg by mouth daily. 30 tablet 11  . furosemide (LASIX) 40 MG tablet Take 1 tablet (40 mg total) by mouth daily. 90 tablet 3  . glucose blood (ACCU-CHEK AVIVA) test strip Use as instructed 2x a day 200 each 3  . ivabradine (CORLANOR) 7.5 MG TABS tablet Take 1 tablet (7.5 mg total) by mouth 2 (two) times daily with a meal. 60 tablet 6  . metFORMIN (GLUCOPHAGE-XR) 500 MG 24 hr tablet Take 1 tablet (500 mg total) by mouth daily with breakfast. 90 tablet 3  . metoprolol succinate (TOPROL-XL) 100 MG 24 hr tablet Take 1 tablet (100 mg total) by mouth daily. Take with or immediately following a meal. 30 tablet 1  . rivaroxaban (XARELTO) 20 MG TABS tablet Take 1 tablet (20 mg total) by mouth daily with supper. Crushed Xarelto can be given down a G-tube but NOT a J-Tube. 30 tablet 2  .  sacubitril-valsartan (ENTRESTO) 49-51 MG Take 1 tablet by mouth 2 (two) times daily. 60 tablet 3  . spironolactone (ALDACTONE) 25 MG tablet Take 1 tablet (25 mg total) by mouth daily. 30 tablet 3   No current facility-administered medications for this encounter.     Allergies: No Known Allergies  Review of systems complete and found to be negative unless listed in HPI.    Vital Signs: Vitals:   05/26/19 1001  BP: 110/63  Pulse: 77  SpO2: 97%  Weight: 94 kg (207 lb 3.2 oz)   Wt Readings from Last 3 Encounters:  05/26/19 94 kg (207 lb 3.2 oz)  05/19/19 91 kg (200 lb 9.6 oz)  12/09/18 99.3 kg (219 lb)    PHYSICAL  EXAM: General:  Well appearing. No resp difficulty HEENT: normal Neck: supple. no JVD. Carotids 2+ bilat; no bruits. No lymphadenopathy or thryomegaly appreciated. Cor: PMI nondisplaced. Regular rate & rhythm. No rubs, gallops or murmurs. Lungs: clear Abdomen: soft, nontender, nondistended. No hepatosplenomegaly. No bruits or masses. Good bowel sounds. Extremities: no cyanosis, clubbing, rash, edema Neuro: alert & orientedx3, cranial nerves grossly intact. moves all 4 extremities w/o difficulty. Affect pleasant  EKG: NSR 76 personally reviewed   ASSESSMENT AND PLAN: 1. Chronic systolic HF due to NICM - ECHO 04/2019 EF 20%. In the past he refused ICD. Next visit will refer again.  - NYHA II. Voume status stable. Continue lasix 40 mg daily.  - Continue  Entresto 49/51 mg BID. - Continue Toprol 100 mg daily.  - Continue ivabradine 7.5 mg twice a day  - Continue spironolactone 25 mg daily - Continue digoxin 0.125 mg daily. - Continue jardiance 10 mg daily.  - Set up CPX.  - Check BMET  2. HTN Stable.   3. OSA -Has not had CPAP since 2014.  Set up home sleep study.   4. PAF -In NSR.  - Continue Xarelto 20 mg daily.   5. Uncontrolled DM - Hgb A1C 7.3   I reviewed post hospital follow up. Follow up in 2 weeks and 6 weeks with Dr Haroldine Laws. Set up CPX. Set up home sleep study.    Darrick Grinder, NP  10:05 AM

## 2019-05-26 NOTE — Progress Notes (Signed)
Patient Name: Tyrone Walker         DOB: May 06, 1976      Height: 207.2lb    Weight:6'  Office Name:[facility] Failure [facility]         Referring Provider:Amy Clegg,NP  Today's Date:05/26/19  Date:   STOP BANG RISK ASSESSMENT S (snore) Have you been told that you snore?     YES   T (tired) Are you often tired, fatigued, or sleepy during the day?   /NO  O (obstruction) Do you stop breathing, choke, or gasp during sleep? YES   P (pressure) Do you have or are you being treated for high blood pressure? YES   B (BMI) Is your body index greater than 35 kg/m? NO   A (age) Are you 71 years old or older? NO   N (neck) Do you have a neck circumference greater than 16 inches?   YES   G (gender) Are you a male? YES   TOTAL STOP/BANG "YES" ANSWERS                                                                        For Office Use Only              Procedure Order Form    YES to 3+ Stop Bang questions OR two clinical symptoms - patient qualifies for WatchPAT (CPT 95800)     Submit: This Form + Patient Face Sheet + Clinical Note via CloudPAT or Fax: [phone]         Clinical Notes: Will consult Sleep Specialist and refer for management of therapy due to patient increased risk of Sleep Apnea. Ordering a sleep study due to the following two clinical symptoms: Excessive daytime sleepiness G47.10  Loud snoring R06.83  Unrefreshed by sleep G47.8 /History of high blood    I understand that I am proceeding with a home sleep apnea test as ordered by my treating physician. I understand that untreated sleep apnea is a serious cardiovascular risk factor and it is my responsibility to perform the test and seek management for sleep apnea. I will be contacted with the results and be managed for sleep apnea by a local sleep physician. I will be receiving equipment and further instructions from [facility]. I shall promptly ship back the equipment via the included mailing label. I understand my  insurance will be billed for the test and as the patient I am responsible for any insurance related out-of-pocket costs incurred. I have been provided with written instructions and can call for additional video or telephonic instruction, with 24-hour availability of qualified personnel to answer any questions: Patient Help Desk [phone].  Patient Signature ______________________________________________________   Date______________________ Patient Telemedicine Verbal Consent

## 2019-05-26 NOTE — Patient Instructions (Signed)
It was great to see you today! No medication changes are needed at this time.  Labs today We will only contact you if something comes back abnormal or we need to make some changes. Otherwise no news is good news!  Your provider has recommended that you have a home sleep study.  [employer] is the company that does these test.  They will contact you by phone and must speak with you before they can ship the equipment.  Once they have spoken with you they will send the equipment right to your home with instructions on how to set it up.  Once you have completed the test you just dispose of the equipment, the information is automatically uploaded to [place] via blue-tooth technology.  IF you have any questions or issues with the equipment please call the company directly at [phone].  If your test is positive for sleep apnea and you need a home CPAP machine you will be contacted by Tyrone Theodosia Blender office [facility]) to set this up.  Your physician has recommended that you have a cardiopulmonary stress test (CPX). CPX testing is a non-invasive measurement of heart and lung function. It replaces a traditional treadmill stress test. This type of test provides a tremendous amount of information that relates not only to your present condition but also for future outcomes. This test combines measurements of you ventilation, respiratory gas exchange in the lungs, electrocardiogram (EKG), blood pressure and physical response before, during, and following an exercise protocol.  Your physician recommends that you schedule a follow-up appointment in: 2 weeks with Tyrone Clegg,Tyrone Walker and 6 weeks with Tyrone Haroldine Laws  Do the following things EVERYDAY: 1) Weigh yourself in the morning before breakfast. Write it down and keep it in a log. 2) Take your medicines as prescribed 3) Eat low salt foods-Limit salt (sodium) to 2000 mg per day.  4) Stay as active as you can everyday 5) Limit all fluids for the day to less than 2  liters  At the [facility], you and your health needs are our priority. As part of our continuing mission to provide you with exceptional heart care, we have created designated Provider Care Teams. These Care Teams include your primary Cardiologist (physician) and Advanced Practice Providers (APPs- Physician Assistants and Nurse Practitioners) who all work together to provide you with the care you need, when you need it.   You may see any of the following providers on your designated Care Team at your next follow up: Tyrone Walker Kitchen Tyrone Walker . Tyrone Walker . Tyrone Grinder, Tyrone Walker . Tyrone Jester, Tyrone Walker   Please be sure to bring in all your medications bottles to every appointment.

## 2019-05-27 ENCOUNTER — Telehealth (HOSPITAL_COMMUNITY): Payer: Self-pay | Admitting: Cardiology

## 2019-05-27 NOTE — Telephone Encounter (Signed)
Order, OV note, stop bang and demographics all faxed to [employer] at [phone] via epic  

## 2019-05-28 ENCOUNTER — Telehealth (HOSPITAL_COMMUNITY): Payer: Self-pay

## 2019-05-28 NOTE — Telephone Encounter (Signed)
ReFaxed [place] Sleep Study to [facility] @ [phone].

## 2019-06-03 ENCOUNTER — Telehealth (HOSPITAL_COMMUNITY): Payer: Self-pay | Admitting: Pharmacist

## 2019-06-03 ENCOUNTER — Other Ambulatory Visit (HOSPITAL_COMMUNITY): Payer: Self-pay | Admitting: Cardiology

## 2019-06-03 MED ORDER — ENTRESTO 49-51 MG PO TABS: 1.0000 | ORAL_TABLET | Freq: Two times a day (BID) | ORAL | 11 refills | Status: DC

## 2019-06-03 MED ORDER — RIVAROXABAN 20 MG PO TABS: ORAL_TABLET | ORAL | 11 refills | Status: AC

## 2019-06-03 NOTE — Telephone Encounter (Signed)
Patient Advocate Encounter   Received notification from [facility] Medicaid that prior authorization for Tyrone Walker is required.   PA submitted on [facility] [employer] [id number] W Recipient ID: [id number] N Status is pending   Will continue to follow.

## 2019-06-03 NOTE — Telephone Encounter (Signed)
Patient requested Xarelto and Entresto be sent to [facility]. Previously sent to incorrect pharmacy.

## 2019-06-04 NOTE — Telephone Encounter (Signed)
Advanced Heart Failure Patient Advocate Encounter  Prior Authorization for Tyrone Walker has been approved.    PA# [id number] Effective dates: 06/03/2019 - 05/28/2020  Patients co-pay is $3.00  Audry Riles, PharmD, BCPS, BCCP, CPP Heart Failure Clinic Pharmacist [phone]

## 2019-06-08 ENCOUNTER — Telehealth (HOSPITAL_COMMUNITY): Payer: Self-pay

## 2019-06-08 NOTE — Telephone Encounter (Signed)
Refaxed ov notes, order, demographics to [facility] @ [phone].

## 2019-06-09 ENCOUNTER — Ambulatory Visit (HOSPITAL_COMMUNITY)
Admission: RE | Admit: 2019-06-09 | Discharge: 2019-06-09 | Disposition: A | Discharge status: Home / Self Care | Payer: Medicaid Other | Source: Ambulatory Visit | Attending: Adult Health | Admitting: Adult Health

## 2019-06-09 ENCOUNTER — Encounter (HOSPITAL_COMMUNITY): Payer: Self-pay

## 2019-06-09 ENCOUNTER — Other Ambulatory Visit: Payer: Self-pay

## 2019-06-09 VITALS — BP 121/85 | HR 81 | Wt 213.2 lb

## 2019-06-09 DIAGNOSIS — Z7901 Long term (current) use of anticoagulants: Secondary | ICD-10-CM | POA: Insufficient documentation

## 2019-06-09 DIAGNOSIS — I480 Paroxysmal atrial fibrillation: Secondary | ICD-10-CM | POA: Insufficient documentation

## 2019-06-09 DIAGNOSIS — I428 Other cardiomyopathies: Secondary | ICD-10-CM | POA: Diagnosis not present

## 2019-06-09 DIAGNOSIS — Z79899 Other long term (current) drug therapy: Secondary | ICD-10-CM | POA: Diagnosis not present

## 2019-06-09 DIAGNOSIS — I10 Essential (primary) hypertension: Secondary | ICD-10-CM | POA: Diagnosis not present

## 2019-06-09 DIAGNOSIS — G4733 Obstructive sleep apnea (adult) (pediatric): Secondary | ICD-10-CM | POA: Insufficient documentation

## 2019-06-09 DIAGNOSIS — R0683 Snoring: Secondary | ICD-10-CM | POA: Diagnosis not present

## 2019-06-09 DIAGNOSIS — I110 Hypertensive heart disease with heart failure: Secondary | ICD-10-CM | POA: Diagnosis not present

## 2019-06-09 DIAGNOSIS — I5022 Chronic systolic (congestive) heart failure: Secondary | ICD-10-CM | POA: Diagnosis present

## 2019-06-09 DIAGNOSIS — Z7984 Long term (current) use of oral hypoglycemic drugs: Secondary | ICD-10-CM | POA: Diagnosis not present

## 2019-06-09 DIAGNOSIS — E1165 Type 2 diabetes mellitus with hyperglycemia: Secondary | ICD-10-CM | POA: Insufficient documentation

## 2019-06-09 LAB — BASIC METABOLIC PANEL
Anion gap: 9 (ref 5–15)
BUN: 12 mg/dL (ref 6–20)
CO2: 24 mmol/L (ref 22–32)
Calcium: 9.1 mg/dL (ref 8.9–10.3)
Chloride: 102 mmol/L (ref 98–111)
Creatinine, Ser: 1.09 mg/dL (ref 0.61–1.24)
GFR calc Af Amer: >60 mL/min (ref >60)
GFR calc non Af Amer: >60 mL/min (ref >60)
Glucose, Bld: 218 mg/dL — ABNORMAL HIGH (ref 70–99)
Potassium: 4.1 mmol/L (ref 3.5–5.1)
Sodium: 135 mmol/L (ref 135–145)

## 2019-06-09 MED ORDER — ENTRESTO 49-51 MG PO TABS: 1.0000 | ORAL_TABLET | Freq: Two times a day (BID) | ORAL | 11 refills | Status: AC

## 2019-06-09 NOTE — Progress Notes (Signed)
Patient ID: Tyrone Walker, male   DOB: June 26, 1975, 43 y.o.   MRN: [id number]     Advanced Heart Failure Clinic Note  Primary HF Cardiologist:  Dr. Glori Bickers PCP: None   History of Present Illness: Tyrone Walker is a 43 y.o. 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 20%.   Cath 11/2010 showed normal cors.  He had serology done to evaluate etiology of his myopathy.  All titers were negative except for Coxsackie B2 titer was elevated.   Admitted 8/20 through 02/14/2017 with increased SOB. Diuresed with IV lasix. ECHO with reduced EF 20-25%. Discharge weight 214 pounds.   Admitted 62/37/62 with A/C systolic heart failure. Diuresed with IV lasix until euvolemic. He was dry on cath so will start lasix 40 mg po daily on 05/21/19. HF meds optimized.  RHC/LHC showed normal coronaries, severe NICM, well preserved cardiac output and low filling pressures. Placed on corlanor due to elevated heart rate.   Today he returns for HF follow up.Overall feeling fine. SOB with brisk walking. Able to walk 1 mile but takes his time. Denies PND/Orthopnea. Appetite ok. No fever or chills. Weight at home has gone up 5 pounds. No drinking alcohol or smoking.  Taking all medications but ran out of entresto a week ago. He also missed a couple doses of lasix last weekend.   CPX 02/05/11: pVO2 23.6 (71%)  Ve/VCO2 27 O2 pulse 80% RER 1.25 VE/MVV 56%  06/2011: LVEF 35-40%, diffuse hypokinesis.  Grade 1 diastolic dysfunction.   01/24/12: LVEF 83-15%, grade 1 diastolic dysfunction.  11/13: ECHO EF 50-55%  03/20/13: EF 30%  01/2017: EF 20-25%.  04/2019: EF 20% RV normal   Past Medical History:  Diagnosis Date  . Acute on chronic systolic (congestive) heart failure ([place]) 02/11/2017  . Atrial fibrillation ([place]) 2011  . Chronic systolic heart failure ([place])   . Colitis 10/12/10   ER visit  . Diabetes mellitus without complication ([place])   . Elevated LFTs    likely hepatic congestion  from CHF  . history Marijuana smoker   . Hypertension 10/18/2011  . Mitral valve regurgitation: Moderate to severe per 2 d echo 02/13/2017 02/13/2017  . Non-ischemic cardiomyopathy ([place]) 10/20/10   a. cath 11/22/10: normal cors; EF 15-20%;  b. echo 11/19/10: EF 20%, mild MR, mild LAE, PASP 48; c. dx 5/12, tx with IV milrinone for low output, Coxsackie B2 titer elevated; o/w HIV, CMV, RMSF, ANA, RF, ESR, ACE all normal    Current Outpatient Medications  Medication Sig Dispense Refill  . digoxin (LANOXIN) 0.125 MG tablet Take 1 tablet (0.125 mg total) by mouth daily. 90 tablet 1  . empagliflozin (JARDIANCE) 25 MG TABS tablet Take 25 mg by mouth daily. 30 tablet 11  . furosemide (LASIX) 40 MG tablet Take 1 tablet (40 mg total) by mouth daily. 90 tablet 3  . glucose blood (ACCU-CHEK AVIVA) test strip Use as instructed 2x a day 200 each 3  . ivabradine (CORLANOR) 7.5 MG TABS tablet Take 1 tablet (7.5 mg total) by mouth 2 (two) times daily with a meal. 60 tablet 6  . metFORMIN (GLUCOPHAGE-XR) 500 MG 24 hr tablet Take 1 tablet (500 mg total) by mouth daily with breakfast. 90 tablet 3  . metoprolol succinate (TOPROL-XL) 100 MG 24 hr tablet Take 1 tablet (100 mg total) by mouth daily. Take with or immediately following a meal. 30 tablet 1  . rivaroxaban (XARELTO) 20 MG TABS tablet TAKE  1 TABLET BY MOUTH DAILY WITH SUPPER , CRUSHED XARELTO CAN BE GIVEN DOWN A G-TUBE, BUT NOT J-TUBE ( NEEDS OFFICE VISIT [phone]) 30 tablet 11  . sacubitril-valsartan (ENTRESTO) 49-51 MG Take 1 tablet by mouth 2 (two) times daily. 60 tablet 11  . spironolactone (ALDACTONE) 25 MG tablet Take 1 tablet (25 mg total) by mouth daily. 30 tablet 3   No current facility-administered medications for this encounter.    Allergies: No Known Allergies  Review of systems complete and found to be negative unless listed in HPI.    Vital Signs: Vitals:   06/09/19 0929  BP: 121/85  Pulse: 81  SpO2: 98%  Weight: 96.7 kg (213 lb 3.2  oz)   Wt Readings from Last 3 Encounters:  06/09/19 96.7 kg (213 lb 3.2 oz)  05/26/19 94 kg (207 lb 3.2 oz)  05/19/19 91 kg (200 lb 9.6 oz)    PHYSICAL EXAM: General:  Well appearing. No resp difficulty HEENT: normal Neck: supple. JVP 7-8. Carotids 2+ bilat; no bruits. No lymphadenopathy or thryomegaly appreciated. Cor: PMI nondisplaced. Regular rate & rhythm. No rubs, gallops or murmurs. Lungs: clear Abdomen: soft, nontender, nondistended. No hepatosplenomegaly. No bruits or masses. Good bowel sounds. Extremities: no cyanosis, clubbing, rash, edema Neuro: alert & orientedx3, cranial nerves grossly intact. moves all 4 extremities w/o difficulty. Affect pleasant   ASSESSMENT AND PLAN: 1. Chronic systolic HF due to NICM - ECHO 04/2019 EF 20%. In the past he refused ICD. Next visit will refer again.  - NYHA II-III - Continue lasix 40 mg daily.  - Refill and restart entresto 49-51 mg twice a day. Discussed the importance of not running out of medications.  - Continue Toprol 100 mg daily.  - Continue ivabradine 7.5 mg twice a day  - Continue spironolactone 25 mg daily - Continue digoxin 0.125 mg daily. - Continue jardiance 10 mg daily.   2. HTN Stable.   3. OSA -Has not had CPAP since 2014.  - He has sleep study set up next month.   4. PAF Regular pulse - Continue Xarelto 20 mg daily.   5. Uncontrolled DM - Hgb A1C 7.3   CPX next month and follow up with Dr Haroldine Laws to discuss the results. Referred to [facility] for PCP.    Darrick Grinder, NP  9:34 AM

## 2019-06-09 NOTE — Progress Notes (Signed)
CSW consulted to help pt with getting new PCP.  Pt reports he has PCP assigned on his Medicaid card so would have to contact Medicaid worker to get it switched.  CSW provided pt with list of local PCPs that accept Medicaid so he could express preferences to [facility] worker.  No further needs expressed at this time.  Jorge Ny, LCSW Clinical Social Worker Advanced [facility] Failure Clinic Desk#: [phone] Cell#: [phone]

## 2019-06-09 NOTE — Patient Instructions (Signed)
It was great to see you today! No medication changes are needed at this time.  Labs today We will only contact you if something comes back abnormal or we need to make some changes. Otherwise no news is good news!  You have been referred to a Primary Care Provider Be sure to schedule an appointment at your earliest convenience   Please be sure to follow up with [facility] for your in home sleep study [phone]  Keep your follow up as scheduled with Dr Haroldine Laws  Do the following things EVERYDAY: 1) Weigh yourself in the morning before breakfast. Write it down and keep it in a log. 2) Take your medicines as prescribed 3) Eat low salt foods-Limit salt (sodium) to 2000 mg per day.  4) Stay as active as you can everyday 5) Limit all fluids for the day to less than 2 liters  At the [facility], you and your health needs are our priority. As part of our continuing mission to provide you with exceptional heart care, we have created designated Provider Care Teams. These Care Teams include your primary Cardiologist (physician) and Advanced Practice Providers (APPs- Physician Assistants and Nurse Practitioners) who all work together to provide you with the care you need, when you need it.   You may see any of the following providers on your designated Care Team at your next follow up: Marland Kitchen Dr Glori Bickers . Dr Loralie Champagne . Darrick Grinder, NP . Lyda Jester, PA . Audry Riles, PharmD   Please be sure to bring in all your medications bottles to every appointment.   Marland Kitchen

## 2019-06-24 ENCOUNTER — Telehealth (HOSPITAL_COMMUNITY): Payer: Self-pay

## 2019-06-24 NOTE — Telephone Encounter (Signed)
LMOM asking patient to call the office to discuss his [place] Sleep Study. [facility] sent a fax stating that the insurance company is not contracted to them and the patient refuses to pay cash price. I wanted to offer him the [facility].

## 2019-06-29 ENCOUNTER — Encounter (HOSPITAL_COMMUNITY): Payer: Self-pay

## 2019-06-29 ENCOUNTER — Other Ambulatory Visit: Payer: Self-pay

## 2019-06-29 ENCOUNTER — Ambulatory Visit (HOSPITAL_COMMUNITY): Payer: Medicaid Other

## 2019-07-02 ENCOUNTER — Other Ambulatory Visit (HOSPITAL_COMMUNITY): Payer: Self-pay | Admitting: Internal Medicine

## 2019-07-06 ENCOUNTER — Encounter (HOSPITAL_COMMUNITY): Payer: Medicaid Other | Admitting: Internal Medicine

## 2019-07-10 ENCOUNTER — Inpatient Hospital Stay (HOSPITAL_COMMUNITY): Admission: RE | Admit: 2019-07-10 | Payer: Medicaid Other | Source: Ambulatory Visit

## 2019-07-14 ENCOUNTER — Encounter (HOSPITAL_COMMUNITY): Payer: Medicaid Other

## 2019-07-22 ENCOUNTER — Encounter (HOSPITAL_COMMUNITY): Payer: Medicaid Other | Admitting: Internal Medicine

## 2019-09-21 ENCOUNTER — Emergency Department (HOSPITAL_COMMUNITY)
Admission: EM | Admit: 2019-09-21 | Discharge: 2019-09-24 | Disposition: E | Payer: Medicaid Other | Attending: Emergency Medicine | Admitting: Emergency Medicine
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
## 2019-09-24 DIAGNOSIS — 419620001 Death: Secondary | SNOMED CT | POA: Diagnosis not present
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
## 2019-09-24 DEATH — deceased
